# Patient Record
Sex: Female | Born: 1975 | Race: Black or African American | Hispanic: No | Marital: Married | Smoking: Former smoker
Health system: Southern US, Community
[De-identification: ages and names within clinical notes are randomized; demographics above are authoritative.]

## PROBLEM LIST (undated history)

## (undated) DIAGNOSIS — Z720 Tobacco use: Secondary | ICD-10-CM

## (undated) DIAGNOSIS — I10 Essential (primary) hypertension: Secondary | ICD-10-CM

## (undated) DIAGNOSIS — L509 Urticaria, unspecified: Secondary | ICD-10-CM

## (undated) DIAGNOSIS — R51 Headache: Secondary | ICD-10-CM

## (undated) DIAGNOSIS — M545 Low back pain, unspecified: Secondary | ICD-10-CM

## (undated) DIAGNOSIS — K219 Gastro-esophageal reflux disease without esophagitis: Secondary | ICD-10-CM

## (undated) DIAGNOSIS — H53459 Other localized visual field defect, unspecified eye: Secondary | ICD-10-CM

## (undated) DIAGNOSIS — I729 Aneurysm of unspecified site: Secondary | ICD-10-CM

## (undated) HISTORY — DX: Low back pain: M54.5

## (undated) HISTORY — PX: TUBAL LIGATION: SHX77

## (undated) HISTORY — DX: Urticaria, unspecified: L50.9

## (undated) HISTORY — DX: Headache: R51

## (undated) HISTORY — DX: Low back pain, unspecified: M54.50

## (undated) HISTORY — DX: Tobacco use: Z72.0

## (undated) HISTORY — PX: HERNIA REPAIR: SHX51

## (undated) HISTORY — DX: Essential (primary) hypertension: I10

---

## 2008-03-06 ENCOUNTER — Inpatient Hospital Stay (HOSPITAL_COMMUNITY): Admission: AD | Admit: 2008-03-06 | Discharge: 2008-03-06 | Payer: Self-pay | Admitting: Family Medicine

## 2008-04-26 ENCOUNTER — Ambulatory Visit (HOSPITAL_COMMUNITY): Admission: RE | Admit: 2008-04-26 | Discharge: 2008-04-26 | Payer: Self-pay | Admitting: Obstetrics and Gynecology

## 2008-08-18 ENCOUNTER — Encounter: Payer: Self-pay | Admitting: Internal Medicine

## 2008-08-18 LAB — CONVERTED CEMR LAB: Pap Smear: NORMAL

## 2008-08-19 ENCOUNTER — Ambulatory Visit: Payer: Self-pay | Admitting: Obstetrics and Gynecology

## 2008-08-19 ENCOUNTER — Inpatient Hospital Stay (HOSPITAL_COMMUNITY): Admission: AD | Admit: 2008-08-19 | Discharge: 2008-08-19 | Payer: Self-pay | Admitting: Obstetrics & Gynecology

## 2008-08-20 ENCOUNTER — Inpatient Hospital Stay (HOSPITAL_COMMUNITY): Admission: AD | Admit: 2008-08-20 | Discharge: 2008-08-22 | Payer: Self-pay | Admitting: Obstetrics & Gynecology

## 2008-08-20 ENCOUNTER — Ambulatory Visit: Payer: Self-pay | Admitting: Obstetrics & Gynecology

## 2008-12-30 ENCOUNTER — Emergency Department (HOSPITAL_COMMUNITY): Admission: EM | Admit: 2008-12-30 | Discharge: 2008-12-31 | Payer: Self-pay | Admitting: Emergency Medicine

## 2009-11-21 ENCOUNTER — Emergency Department (HOSPITAL_COMMUNITY)
Admission: EM | Admit: 2009-11-21 | Discharge: 2009-11-22 | Payer: Self-pay | Source: Home / Self Care | Admitting: Emergency Medicine

## 2010-01-18 HISTORY — PX: ANEURYSM COILING: SHX5349

## 2010-02-04 ENCOUNTER — Inpatient Hospital Stay (HOSPITAL_COMMUNITY)
Admission: EM | Admit: 2010-02-04 | Discharge: 2010-02-11 | Disposition: A | Discharge status: Still In Hospital | Payer: Self-pay | Source: Home / Self Care | Attending: Internal Medicine | Admitting: Internal Medicine

## 2010-02-05 ENCOUNTER — Inpatient Hospital Stay (HOSPITAL_COMMUNITY)
Admission: EM | Admit: 2010-02-05 | Discharge: 2010-02-11 | Payer: Self-pay | Source: Home / Self Care | Attending: Internal Medicine | Admitting: Internal Medicine

## 2010-02-05 ENCOUNTER — Ambulatory Visit (HOSPITAL_COMMUNITY)
Admission: RE | Admit: 2010-02-05 | Discharge: 2010-02-05 | Payer: Self-pay | Attending: Neurology | Admitting: Neurology

## 2010-02-09 LAB — URINALYSIS, ROUTINE W REFLEX MICROSCOPIC
Ketones, ur: NEGATIVE mg/dL
Leukocytes, UA: NEGATIVE
Nitrite: NEGATIVE
Nitrite: NEGATIVE
Protein, ur: NEGATIVE mg/dL
Specific Gravity, Urine: 1.006 (ref 1.005–1.030)
Urobilinogen, UA: 0.2 mg/dL (ref 0.0–1.0)
pH: 7.5 (ref 5.0–8.0)

## 2010-02-09 LAB — PHOSPHORUS: Phosphorus: 2.2 mg/dL — ABNORMAL LOW (ref 2.3–4.6)

## 2010-02-09 LAB — CBC
HCT: 39.2 % (ref 36.0–46.0)
HCT: 35.5 % — ABNORMAL LOW (ref 36.0–46.0)
Hemoglobin: 13.7 g/dL (ref 12.0–15.0)
Hemoglobin: 12.2 g/dL (ref 12.0–15.0)
MCH: 31.5 pg (ref 26.0–34.0)
MCHC: 34.4 g/dL (ref 30.0–36.0)
MCV: 91.1 fL (ref 78.0–100.0)
Platelets: 254 10*3/uL (ref 150–400)
RDW: 12.6 % (ref 11.5–15.5)
RDW: 12.9 % (ref 11.5–15.5)
RDW: 13.3 % (ref 11.5–15.5)
WBC: 6.7 10*3/uL (ref 4.0–10.5)
WBC: 13.3 10*3/uL — ABNORMAL HIGH (ref 4.0–10.5)

## 2010-02-09 LAB — CSF CELL COUNT WITH DIFFERENTIAL
Lymphs, CSF: 49 % (ref 40–80)
RBC Count, CSF: 265100 /mm3 — ABNORMAL HIGH
Segmented Neutrophils-CSF: 49 % — ABNORMAL HIGH (ref 0–6)
Segmented Neutrophils-CSF: 69 % — ABNORMAL HIGH (ref 0–6)
Tube #: 4

## 2010-02-09 LAB — CRYPTOCOCCAL ANTIGEN: Crypto Ag: NEGATIVE

## 2010-02-09 LAB — COMPREHENSIVE METABOLIC PANEL
ALT: 12 U/L (ref 0–35)
AST: 13 U/L (ref 0–37)
Albumin: 3.6 g/dL (ref 3.5–5.2)
Calcium: 8.8 mg/dL (ref 8.4–10.5)
Creatinine, Ser: 0.75 mg/dL (ref 0.4–1.2)
GFR calc Af Amer: >60 mL/min (ref >60)
GFR calc non Af Amer: >60 mL/min (ref >60)
Sodium: 139 mEq/L (ref 135–145)
Total Protein: 6.4 g/dL (ref 6.0–8.3)

## 2010-02-09 LAB — POCT I-STAT, CHEM 8
Chloride: 105 mEq/L (ref 96–112)
Glucose, Bld: 105 mg/dL — ABNORMAL HIGH (ref 70–99)
HCT: 41 % (ref 36.0–46.0)
Potassium: 3.4 mEq/L — ABNORMAL LOW (ref 3.5–5.1)
Sodium: 140 mEq/L (ref 135–145)

## 2010-02-09 LAB — DIFFERENTIAL
Basophils Absolute: 0 10*3/uL (ref 0.0–0.1)
Basophils Relative: 0 % (ref 0–1)
Eosinophils Relative: 2 % (ref 0–5)
Lymphocytes Relative: 42 % (ref 12–46)
Neutro Abs: 3.3 10*3/uL (ref 1.7–7.7)

## 2010-02-09 LAB — URINE MICROSCOPIC-ADD ON

## 2010-02-09 LAB — URINE CULTURE
Colony Count: NO GROWTH
Culture  Setup Time: 201201190413
Culture: NO GROWTH

## 2010-02-09 LAB — RAPID URINE DRUG SCREEN, HOSP PERFORMED
Amphetamines: NOT DETECTED
Barbiturates: NOT DETECTED
Benzodiazepines: NOT DETECTED
Cocaine: NOT DETECTED
Cocaine: NOT DETECTED
Opiates: NOT DETECTED
Tetrahydrocannabinol: NOT DETECTED
Tetrahydrocannabinol: NOT DETECTED

## 2010-02-09 LAB — LIPID PANEL
Cholesterol: 129 mg/dL (ref 0–200)
LDL Cholesterol: 78 mg/dL (ref 0–99)

## 2010-02-09 LAB — POCT PREGNANCY, URINE: Preg Test, Ur: NEGATIVE

## 2010-02-09 LAB — BASIC METABOLIC PANEL
BUN: 4 mg/dL — ABNORMAL LOW (ref 6–23)
GFR calc Af Amer: >60 mL/min (ref >60)
GFR calc non Af Amer: >60 mL/min (ref >60)
Potassium: 3.4 mEq/L — ABNORMAL LOW (ref 3.5–5.1)
Sodium: 142 mEq/L (ref 135–145)

## 2010-02-09 LAB — MRSA PCR SCREENING: MRSA by PCR: NEGATIVE

## 2010-02-09 LAB — PROTIME-INR: INR: 1.14 (ref 0.00–1.49)

## 2010-02-09 LAB — CRYPTOCOCCAL ANTIGEN, CSF: Crypto Ag: NEGATIVE

## 2010-02-09 LAB — RPR: RPR Ser Ql: NONREACTIVE

## 2010-02-10 LAB — DIFFERENTIAL
Basophils Absolute: 0 10*3/uL (ref 0.0–0.1)
Eosinophils Absolute: 0.1 10*3/uL (ref 0.0–0.7)
Eosinophils Absolute: 0.2 10*3/uL (ref 0.0–0.7)
Eosinophils Relative: 1 % (ref 0–5)
Eosinophils Relative: 3 % (ref 0–5)
Lymphocytes Relative: 44 % (ref 12–46)
Lymphs Abs: 3.1 10*3/uL (ref 0.7–4.0)
Monocytes Relative: 7 % (ref 3–12)
Neutrophils Relative %: 51 % (ref 43–77)

## 2010-02-10 LAB — CBC
HCT: 36.2 % (ref 36.0–46.0)
MCH: 31.4 pg (ref 26.0–34.0)
MCV: 89.4 fL (ref 78.0–100.0)
Platelets: 269 10*3/uL (ref 150–400)
Platelets: 274 10*3/uL (ref 150–400)
RBC: 4.05 MIL/uL (ref 3.87–5.11)
RDW: 13 % (ref 11.5–15.5)
WBC: 9.6 10*3/uL (ref 4.0–10.5)

## 2010-02-10 LAB — URINE CULTURE
Colony Count: NO GROWTH
Culture  Setup Time: 201201201952

## 2010-02-10 LAB — BASIC METABOLIC PANEL
BUN: 12 mg/dL (ref 6–23)
CO2: 26 mEq/L (ref 19–32)
Calcium: 8.9 mg/dL (ref 8.4–10.5)
Chloride: 102 mEq/L (ref 96–112)
Creatinine, Ser: 0.64 mg/dL (ref 0.4–1.2)
Creatinine, Ser: 0.68 mg/dL (ref 0.4–1.2)
GFR calc non Af Amer: >60 mL/min (ref >60)
Glucose, Bld: 101 mg/dL — ABNORMAL HIGH (ref 70–99)
Glucose, Bld: 136 mg/dL — ABNORMAL HIGH (ref 70–99)
Potassium: 3.9 mEq/L (ref 3.5–5.1)
Sodium: 139 mEq/L (ref 135–145)

## 2010-02-10 LAB — CSF CULTURE W GRAM STAIN: Culture: NO GROWTH

## 2010-02-11 ENCOUNTER — Other Ambulatory Visit (HOSPITAL_COMMUNITY): Payer: Self-pay | Admitting: Interventional Radiology

## 2010-02-11 DIAGNOSIS — I729 Aneurysm of unspecified site: Secondary | ICD-10-CM

## 2010-02-11 LAB — CBC
HCT: 33.3 % — ABNORMAL LOW (ref 36.0–46.0)
Hemoglobin: 11.6 g/dL — ABNORMAL LOW (ref 12.0–15.0)
MCHC: 34.8 g/dL (ref 30.0–36.0)
MCV: 89 fL (ref 78.0–100.0)
RDW: 12.6 % (ref 11.5–15.5)

## 2010-02-11 LAB — DIFFERENTIAL
Eosinophils Relative: 0 % (ref 0–5)
Lymphocytes Relative: 6 % — ABNORMAL LOW (ref 12–46)
Lymphs Abs: 0.5 10*3/uL — ABNORMAL LOW (ref 0.7–4.0)
Monocytes Absolute: 0 10*3/uL — ABNORMAL LOW (ref 0.1–1.0)
Monocytes Relative: 0 % — ABNORMAL LOW (ref 3–12)
Neutro Abs: 7.2 10*3/uL (ref 1.7–7.7)

## 2010-02-11 LAB — BASIC METABOLIC PANEL
BUN: 10 mg/dL (ref 6–23)
BUN: 5 mg/dL — ABNORMAL LOW (ref 6–23)
CO2: 25 mEq/L (ref 19–32)
CO2: 24 mEq/L (ref 19–32)
Calcium: 8.7 mg/dL (ref 8.4–10.5)
Chloride: 106 mEq/L (ref 96–112)
GFR calc non Af Amer: >60 mL/min (ref >60)
GFR calc non Af Amer: >60 mL/min (ref >60)
Glucose, Bld: 105 mg/dL — ABNORMAL HIGH (ref 70–99)
Glucose, Bld: 161 mg/dL — ABNORMAL HIGH (ref 70–99)
Potassium: 4.1 mEq/L (ref 3.5–5.1)

## 2010-02-11 LAB — APTT
aPTT: 28 seconds (ref 24–37)
aPTT: 30 seconds (ref 24–37)

## 2010-02-12 LAB — POCT ACTIVATED CLOTTING TIME
Activated Clotting Time: 181 seconds
Activated Clotting Time: 170 seconds

## 2010-02-13 NOTE — Discharge Summary (Addendum)
Kathy Chaney, Kathy Chaney              ACCOUNT NO.:  [id number]  MEDICAL RECORD NO.:  [id number]          PATIENT TYPE:  INP  LOCATION:  3111                         FACILITY:  [facility]  PHYSICIAN:  Lonia Blood, M.D.       DATE OF BIRTH:  [birth year]  DATE OF ADMISSION:  02/05/2010 DATE OF DISCHARGE:  02/11/2010                              DISCHARGE SUMMARY   Primary care physician is going to become Dr. Sanda Linger with [facility].  DISCHARGE DIAGNOSES: 1. 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 Corliss Skains. 2. Hypertension. 3. Tobacco abuse.  DISCHARGE MEDICATIONS: 1. Norvasc 5 mg daily. 2. Ibuprofen 200 mg by mouth 3 times a day as needed for pain. 3. Tylenol 650 mg by mouth every 4 hours needed for pain. 4. Nicotine patches 21 mg for 2 weeks, 14 mg for 2 weeks, 7 mg for 2     weeks then stop.  CONDITION ON DISCHARGE:  The patient discharged alert, oriented, in no acute this distress.  She will follow up with Dr. Corliss Skains from interventional radiology on February 24, 2010, at 2 o'clock in the afternoon.  She was told not to drive, no heavy lifting, no bending. The patient was also scheduled with a new primary care physician Dr. Sanda Linger on February 19, 2010, at 10:30 o'clock in the morning.  PROCEDURE DURING THIS ADMISSION: 1. On February 04, 2010, the patient underwent a head CT without     contrast.  No evidence for acute intracranial hemorrhage,     hydrocephalus, or mass effect. 2. On February 04, 2010, MRI of the brain, which showed normal     appearance of the brain.  No evidence for meningitis. 3. MRA of the head.  Findings of possible basilar artery aneurysm. 4. CT angiogram of the head.  Findings of 6.7-mm aneurysm projecting     left forehead from the basilar tip with a transverse diameter of     2.5 mm. 5.  Lumbar puncture.  Performed by Dr. Susy Frizzle findings of     total protein of 539, 960 white blood cells, and 31,650 red blood     cells.  Negative cryptococcal antigen, and no growth in the     culture. 6. 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 7. On February 10, 2010, coiling of the basilar artery aneurysm.  CONSULTATION THIS ADMISSION:  The patient was seen in consultation by Dr. Porfirio Mylar Dohmeier from Neurology and Dr. Corliss Skains from Interventional Radiology.  HISTORY AND PHYSICAL:  Refer to dictated H and P, which was done by Dr. Venetia Constable on February 04, 2010.  HOSPITAL COURSE:  [name] is a 35 year old woman without any significant past medical history was admitted from the emergency room with complaints of excruciating headache.  She was found to have 33,000 red blood cells on the spinal tap and a  basilar artery aneurysm.  There was no evidence for subarachnoid hemorrhage.  It was thought that probably the red blood cells in the lumbar puncture were probably due to traumatic tap rather than through undiagnosed subarachnoid hemorrhage. The patient went on to have a cerebral angiogram, which did not demonstrate any aneurysmal leak.  Her mental status remained fine and she was not hemodynamically unstable at any point in time.  Upon the initial admission, she was started on empiric antibiotics in case this was a meningitis, but her clinical course was not compatible with meningitis.  It was felt that the patient had a headache, which could not be related to the basal artery aneurysm versus it was a severe migraine headache.  The patient was given intravenous Decadron and intravenous Toradol, which controls her pain nicely.  She was then transferred to [facility] where on February 05, 2010, underwent a cerebral angiogram confirming the presence of the aneurysm.  She  then patiently waited for 5 days in the hospital.  Interventional radiology procedure on February 10, 2010 to coil the aneurysm.  Postprocedure, she has done well, remaining neurologically intact, alert, oriented, in no acute distress with stable vital signs.  Today, the patient upon exam at the time of discharge shows a temperature 98.3, heart rate of 68, blood pressure 110/74, saturating 99% on room air.  She is alert and oriented in no acute distress.  Strength 5/5 in all 4 extremities. Sensation intact.  Pupils symmetric and reactive to light.  All cranial nerves are within normal limits.  She is set up with follow up with Interventional Radiology as well as with her new primary care physician, Dr. Sanda Linger.     Lonia Blood, M.D.     SL/MEDQ  D:  02/11/2010  T:  02/12/2010  Job:  [id number]  cc:   Sanda Linger, MD [place] Silos. Corliss Skains, M.D.  Electronically Signed by Lonia Blood M.D. on 02/13/2010 02:13:51 PM

## 2010-02-19 ENCOUNTER — Ambulatory Visit (INDEPENDENT_AMBULATORY_CARE_PROVIDER_SITE_OTHER): Payer: BC Managed Care – PPO | Admitting: Internal Medicine

## 2010-02-19 ENCOUNTER — Encounter: Payer: Self-pay | Admitting: Internal Medicine

## 2010-02-19 DIAGNOSIS — M545 Low back pain: Secondary | ICD-10-CM | POA: Insufficient documentation

## 2010-02-19 DIAGNOSIS — R519 Headache, unspecified: Secondary | ICD-10-CM | POA: Insufficient documentation

## 2010-02-19 DIAGNOSIS — I671 Cerebral aneurysm, nonruptured: Secondary | ICD-10-CM | POA: Insufficient documentation

## 2010-02-19 DIAGNOSIS — H538 Other visual disturbances: Secondary | ICD-10-CM

## 2010-02-19 DIAGNOSIS — R51 Headache: Secondary | ICD-10-CM

## 2010-02-19 DIAGNOSIS — F17200 Nicotine dependence, unspecified, uncomplicated: Secondary | ICD-10-CM

## 2010-02-19 DIAGNOSIS — K429 Umbilical hernia without obstruction or gangrene: Secondary | ICD-10-CM | POA: Insufficient documentation

## 2010-02-19 DIAGNOSIS — I10 Essential (primary) hypertension: Secondary | ICD-10-CM

## 2010-02-22 NOTE — H&P (Signed)
Kathy Chaney, Kathy Chaney              ACCOUNT NO.:  [id number]  MEDICAL RECORD NO.:  [id number]          PATIENT TYPE:  EMS  LOCATION:  ED                           FACILITY:  [facility]  PHYSICIAN:  Conley Canal, MD      DATE OF BIRTH:  1975/09/28  DATE OF ADMISSION:  02/04/2010 DATE OF DISCHARGE:                             HISTORY & PHYSICAL   PRIMARY CARE PHYSICIAN:  The patient has no primary care physician.  CHIEF COMPLAINT:  Severe left-sided headache x3 days.  HISTORY OF PRESENT ILLNESS:  [name] is a pleasant 35 year old female with history of hypertension not on medications as she was not following with any PCP recently, who comes in today with complaints of severe left-sided headache.  She described it as the worst headache of her life.  It started gradually 2 days ago, throbbing in nature, mostly left sided, associated with photophobia and some neck pain.  It culminated in nausea and vomiting yesterday.  She described the vomiting as nonprojectile; however, she states that the headache was worsened by lying down.  She denies any fever.  No cough.  No diarrhea.  Nobody else sick at home.  She took some ibuprofen at home without any relief.  This is the first such severe headache.  The headache was relieved when she received pain medications in the emergency room, and she also felt much relief after the lumbar puncture performed in the emergency room.  She had a CT of the brain without contrast, which suggested possible inflammation in the suprasellar cistern.  She had a lumbar puncture which demonstrated WBC of 480, RBC 330,000 with 49% segmented neutrophils and 2% eosinophils.  The CSF was described as red and cloudy with a protein level 539, glucose level 54.  The patient was hence given vancomycin and ceftriaxone by the emergency room physician, and she was referred to the Hospitalist service for further management.  She is awaiting an MRI.  At the time of evaluation,  her headache has improved. She is more comfortable.  She denies any other symptomatology.  PAST MEDICAL HISTORY: 1. Hypertension. 2. Tobacco habituation.  ALLERGIES:  No known drug allergies.  HOME MEDICATIONS:  Ibuprofen.  FAMILY HISTORY:  The patient does not know her family history as she was adopted.  REVIEW OF SYSTEMS:  Unremarkable except as highlighted in the history of present illness.  SOCIAL HISTORY:  The patient is married.  She has 3 kids.  Smokes up to 8 cigarettes per day, occasionally drinks alcohol and denies illicit drugs.  PHYSICAL EXAMINATION:  GENERAL:  This is a well-looking young lady who is not in acute distress. VITAL SIGNS:  Blood pressure 114/72, heart rate is 75.  She is afebrile, oxygenating adequately. HEAD, EYES, EARS, NOSE AND THROAT:  Pupils equal, reacting to light. NECK:  No neck stiffness. RESPIRATORY SYSTEM:  Good air entry bilaterally with no rhonchi, rales or wheezes. CARDIOVASCULAR SYSTEM:  First and second heart sounds heard.  No murmurs.  Pulse regular. ABDOMEN:  Scaphoid, soft, nontender.  No palpable organomegaly.  Bowel sounds are normal. CNS:  The patient is alert, oriented in person,  place and time.  Kernig sign negative.  No localizing neurological signs. EXTREMITIES:  No pedal edema.  Peripheral pulses equal.  LABORATORY DATA:  Labs were reviewed, significant for CBC normal. Electrolytes significant for potassium of 3.4, glucose 105, BUN 8, creatinine 0.8.  Pregnancy test negative.  Urinalysis significant for moderate blood with rare bacteria.  CSF results were discussed above.  CT brain without contrast results also discussed above.  IMPRESSION:  A 35 year old female presenting with severe headache with cerebrospinal fluid showing high number of red blood cells.  The concern at this point would be meningitis versus subarachnoid hemorrhage.  If meningitis, would worry about herpes simplex virus.  PLAN: 1. Severe  headache, meningitis, rule out other etiologies.  The     patient will be admitted to Regular Medicine under droplet     precautions.  She will be covered as if meningitis with     ceftriaxone, vancomycin and acyclovir.  We will follow CSF     including Gram stain culture and sensitivity, HSV PCR, Cryptococcal     antigen.  Also, obtain MRI to evaluate possibility of encephalitis     in her anatomy better.  Meanwhile, we will consult Neurology.  We     will obtain HIV tests, RPR and vitamin B12 level. 2. Hypertension.  We will place the patient on Norvasc. 3. Tobacco habituation.  Smoking cessation counseling given.  We will     place the patient on nicotine patch. 4. DVT prophylaxis.  SCDs. 5. The patient's condition is guarded.     Conley Canal, MD     SR/MEDQ  D:  02/04/2010  T:  02/04/2010  Job:  [id number]  Electronically Signed by Conley Canal  on 02/22/2010 57:84:69 PM

## 2010-02-24 ENCOUNTER — Ambulatory Visit (HOSPITAL_COMMUNITY)
Admission: RE | Admit: 2010-02-24 | Discharge: 2010-02-24 | Disposition: A | Discharge status: Home / Self Care | Payer: BC Managed Care – PPO | Source: Ambulatory Visit | Attending: Interventional Radiology | Admitting: Interventional Radiology

## 2010-02-24 DIAGNOSIS — I729 Aneurysm of unspecified site: Secondary | ICD-10-CM

## 2010-02-25 NOTE — Assessment & Plan Note (Signed)
Summary: new / bcbs / # cd -- [phone]   Vital Signs:  Patient profile:   35 year old female Menstrual status:  regular LMP:     01/31/2010 Height:      62 inches Weight:      127.75 pounds BMI:     23.45 O2 Sat:      99 % on Room air Temp:     98.2 degrees F oral Pulse rate:   67 / minute Pulse rhythm:   regular Resp:     16 per minute BP sitting:   102 / 64  (left arm) Cuff size:   regular  Vitals Entered By: Rock Nephew CMA (February 19, 2010 10:54 AM)  O2 Flow:  Room air CC: New hospital follow up, Hypertension Management, Preventive Care  Does patient need assistance? Functional Status Self care Ambulation Normal LMP (date): 01/31/2010     Menstrual Status regular Enter LMP: 01/31/2010 Last PAP Result normal   Primary Care Provider:  Yetta Barre  CC:  New hospital follow up, Hypertension Management, and Preventive Care.  History of Present Illness: New to me she is s/p admission for headache that was found to be caused by a left basal apex artery aneurysm that was treated with endovascular coil embolization of the basilar artery. She is doing well and says thet her headache has resolved but she still has blurred vision in her left eye and needs a visit with an eye doctor.  Also, she wants to see a surgeon to see if her umbilical hernia can be repaired.  Hypertension History:      She denies headache, chest pain, palpitations, dyspnea with exertion, orthopnea, PND, peripheral edema, visual symptoms, neurologic problems, syncope, and side effects from treatment.  She notes no problems with any antihypertensive medication side effects.        Positive major cardiovascular risk factors include hypertension.  Negative major cardiovascular risk factors include female age less than 58 years old, no history of diabetes, negative family history for ischemic heart disease, and non-tobacco-user status.        Positive history for target organ damage include prior stroke (or  TIA).  Further assessment for target organ damage reveals no history of ASHD, cardiac end-organ damage (CHF/LVH), peripheral vascular disease, renal insufficiency, or hypertensive retinopathy.     Preventive Screening-Counseling & Management  Alcohol-Tobacco     Alcohol drinks/day: 0     Alcohol Counseling: not indicated; patient does not drink     Smoking Status: never     Smoking Cessation Counseling: yes     Smoke Cessation Stage: quit     Packs/Day: 0.5     Year Started: 1990     Pack years: 15     Tobacco Counseling: to remain off tobacco products  Caffeine-Diet-Exercise     Does Patient Exercise: yes  Hep-HIV-STD-Contraception     Hepatitis Risk: no risk noted     HIV Risk: no risk noted     STD Risk: no risk noted  Safety-Violence-Falls     Seat Belt Use: yes     Helmet Use: yes     Firearms in the Home: no firearms in the home     Smoke Detectors: yes     Violence in the Home: no risk noted      Sexual History:  currently monogamous.        Drug Use:  no.        Blood Transfusions:  no.  Current Medications (verified): 1)  Norvasc 5 Mg Tabs (Amlodipine Besylate) .... Take 1 Tablet By Mouth Once A Day 2)  Tylenol 8 Hour 650 Mg Cr-Tabs (Acetaminophen) 3)  Nicotine Patches  Allergies (verified): No Known Drug Allergies  Past History:  Past Medical History: Hypertension Headache Low back pain  Past Surgical History: Tubal ligation  Family History: adopted  Social History: Married Alcohol use-no Drug use-no Regular exercise-yes Occupation: homemaker Smoking Status:  never Packs/Day:  0.5 Hepatitis Risk:  no risk noted HIV Risk:  no risk noted STD Risk:  no risk noted Seat Belt Use:  yes Drug Use:  no Does Patient Exercise:  yes Sexual History:  currently monogamous Blood Transfusions:  no  Review of Systems  The patient denies anorexia, fever, weight loss, weight gain, chest pain, syncope, dyspnea on exertion, peripheral edema,  prolonged cough, headaches, hemoptysis, abdominal pain, suspicious skin lesions, transient blindness, difficulty walking, depression, and enlarged lymph nodes.   Eyes:  Complains of blurring and vision loss-1 eye; denies discharge, double vision, eye irritation, eye pain, halos, itching, light sensitivity, red eye, and vision loss-both eyes. MS:  Complains of low back pain and stiffness; denies joint pain, joint redness, joint swelling, loss of strength, muscle aches, muscle, muscle weakness, and thoracic pain. Neuro:  Complains of visual disturbances; denies brief paralysis, difficulty with concentration, disturbances in coordination, falling down, headaches, inability to speak, memory loss, numbness, poor balance, seizures, sensation of room spinning, tingling, tremors, and weakness.  Physical Exam  General:  alert, well-developed, well-nourished, well-hydrated, appropriate dress, normal appearance, healthy-appearing, cooperative to examination, and good hygiene.   Head:  normocephalic, atraumatic, no abnormalities observed, and no abnormalities palpated.   Eyes:  anisocoria - pleft pupil is slightly dilated Ears:  R ear normal and L ear normal.   Nose:  External nasal examination shows no deformity or inflammation. Nasal mucosa are pink and moist without lesions or exudates. Mouth:  Oral mucosa and oropharynx without lesions or exudates.  Teeth in good repair. Neck:  supple, full ROM, no masses, no thyromegaly, no thyroid nodules or tenderness, no JVD, no HJR, normal carotid upstroke, no carotid bruits, no cervical lymphadenopathy, and no neck tenderness.   Lungs:  normal respiratory effort, no intercostal retractions, no accessory muscle use, normal breath sounds, no dullness, no fremitus, no crackles, and no wheezes.   Heart:  normal rate, regular rhythm, no murmur, no gallop, no rub, and no JVD.   Abdomen:  soft, non-tender, normal bowel sounds, no distention, no masses, no guarding, no  rigidity, no rebound tenderness, no inguinal hernia, no hepatomegaly, no splenomegaly, and umbilical hernia.   Msk:  normal ROM, no joint tenderness, no joint swelling, no joint warmth, no redness over joints, no joint deformities, no joint instability, no crepitation, and no muscle atrophy.   Pulses:  R and L carotid,radial,femoral,dorsalis pedis and posterior tibial pulses are full and equal bilaterally Extremities:  No clubbing, cyanosis, edema, or deformity noted with normal full range of motion of all joints.   Neurologic:  No cranial nerve deficits noted. Station and gait are normal. Plantar reflexes are down-going bilaterally. DTRs are symmetrical throughout. Sensory, motor and coordinative functions appear intact. Skin:  turgor normal, color normal, no rashes, no suspicious lesions, no ecchymoses, no petechiae, no purpura, no ulcerations, no edema, and tattoo(s).   Cervical Nodes:  no anterior cervical adenopathy and no posterior cervical adenopathy.   Axillary Nodes:  no R axillary adenopathy and no L axillary adenopathy.  Inguinal Nodes:  no R inguinal adenopathy and no L inguinal adenopathy.   Psych:  Cognition and judgment appear intact. Alert and cooperative with normal attention span and concentration. No apparent delusions, illusions, hallucinations   Impression & Recommendations:  Problem # 1:  TOBACCO USE (ICD-305.1) Assessment Improved  Encouraged smoking cessation and discussed different methods for smoking cessation.   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 Take 1 tablet by mouth once a day  BP today: 102/64  10 Yr Risk Heart Disease: Not enough information  Problem # 6:  BLURRED VISION (ICD-368.8) Assessment: New  Orders: Ophthalmology Referral (Ophthalmology)  Complete Medication List: 1)  Norvasc 5 Mg Tabs (Amlodipine besylate) .... Take 1 tablet by mouth once a day 2)  Tylenol 8 Hour 650 Mg Cr-tabs (Acetaminophen) 3)  Nicotine Patches   Other Orders: Physical Therapy Referral (PT)  Hypertension Assessment/Plan:      The patient's hypertensive risk group is category C: Target organ damage and/or diabetes.  Today's blood pressure is 102/64.  Her blood pressure goal is < 140/90.  PAP Screening:    Hx Cervical Dysplasia in last 5 yrs? No    3 normal PAP smears in last 5 yrs? Yes    Last PAP smear:  08/18/2008    Reviewed PAP smear recommendations:  patient defers to GYN provider  Osteoporosis Risk Assessment:  Risk Factors for Fracture or Low Bone Density:   Smoking status:       never  Patient Instructions: 1)  Please schedule a follow-up appointment in 3 months. 2)  Check your Blood Pressure regularly. If it is above 130/80: you should make an appointment. Prescriptions: NORVASC 5 MG TABS (AMLODIPINE BESYLATE) Take 1 tablet by mouth once a day  #30 x 11   Entered and Authorized by:   Etta Grandchild MD   Signed by:   Etta Grandchild MD on 02/19/2010   Method used:   Print then Give to Patient   RxID:   [id number]    Orders Added: 1)  Ophthalmology Referral [Ophthalmology] 2)  Physical Therapy Referral [PT] 3)  Surgical Referral [Surgery] 4)  New Patient Level IV [14782]    Preventive Care Screening  Pap Smear:    Date:  08/18/2008    Results:  normal

## 2010-02-26 ENCOUNTER — Ambulatory Visit: Payer: BC Managed Care – PPO | Admitting: Rehabilitative and Restorative Service Providers"

## 2010-03-04 NOTE — Consult Note (Signed)
NAMECORI, Kathy Chaney NO.:  [id number]  MEDICAL RECORD NO.:  [id number]          PATIENT TYPE:  INP  LOCATION:  0107                         FACILITY:  [facility]  PHYSICIAN:  Melvyn Novas, M.D.  DATE OF BIRTH:  November 08, 1975  DATE OF CONSULTATION:  02/04/2010 DATE OF DISCHARGE:                                CONSULTATION   REQUESTING PHYSICIAN:  Samuel Jester, DO of the [facility] ED and by the [facility] who is admitting the patient to the [facility].  HISTORY OF PRESENT ILLNESS:  Kathy Chaney was born on 1975-09-29. She is a 35 year old African American right-handed female, slender, well groomed, alert and oriented.  She presents with an acute onset of headaches between 10:30 and 1:30 last night that became so severe that she was extremely nauseated.  She felt that the left side of her head "was exploding" and felt severely impaired.  She was crying according to her girlfriend who then brought her here to the hospital.  This friend works in nursing care, took the patient's blood pressure because the first concern of Kathy Chaney was that her blood pressure may be the cause of the headaches and also her blood pressure was slightly elevated at 145/100.  It was certainly not explaining the severe degree of headaches.  She also became nauseated and vomited and when she came to the [facility] ED, she was placed on meningitis precautions due to this development.  The patient had already some headaches throughout Monday but no neck stiffness.  On Tuesday, she said she felt increasingly impaired at night and then on Wednesday morning at about 1:30 a.m. came here to the [facility] ER.  She also started to hyperventilate.  At that time, she may have had neck stiffness, but this has improved.  There is no neck stiffness, photophobia or even headache at this time present. The patient underwent a traumatic lumbar puncture here in the ED, and  we received the red blood cell count.  They show 331,000 in tube #1 and 265,000 in tube #4.  There is not a significant drop in red blood cells, and the concern was now that she may have an aneurysm or subarachnoid hemorrhage.  PAST MEDICAL HISTORY:  Only positive for hypertension.  MEDICATIONS:  She is on no current medications.  ALLERGIES:  She has not listed any medication allergies.  FAMILY HISTORY:  Positive for hypertension.  SOCIAL HISTORY:  She has 2 children, a teenager and a 19-year-old and according to my colleague's note she is a single mother.  REVIEW OF SYSTEMS:  Endorsed acute headache, left temple, a feeling of pressure behind the left eye and in the temple, neck stiffness transiently, nausea and vomiting transiently.  No numbness, no blackout, no tremor, but she was definitely very anxious, she states.  PHYSICAL EXAMINATION:  Her blood pressure here has been systolically ranging from 140-160 and a diastolic now is 72, but has been as high as 86.  Her pulse rate is between 75-110.  Her respiratory rate now is 16. Her temperature is 98 degrees Fahrenheit.  She is alert and  oriented. She carries out 2-step commands.  She is conversant, pleasant.  She does not seem to be impaired at this time, rather scared about what happened to her last night, but she does not have residual effects.  Her eyes show normal extraocular movements, but she did have a mild nystagmus with gaze to the left.  Interesting is also that her left pupil is more dilated than her right and does not directly constrict to light.  She is not aware if this is new or old.  She has never been told about this finding before.  Her face is symmetric.  She does not have an exophthalmos, enophthalmos or ptosis, and she has no facial sensory loss.  There is no dysarthria or aphasia.  She can perform a shoulder shrug.  She has now a full range of motion for the neck.  She can chin tuck, and she shows good  coordination on finger-nose-finger testing and heel-to-shin testing.  Gait exam was deferred, but the patient moved all extremities antigravity purposefully without drift and again body sensory is also intact to primary modalities.  LABORATORY DATA:  Labs show a white blood cell count of 6.7 and an hemoglobin and hematocrit of 13.7/39.2 and 293 platelets.  Sodium is 140, potassium is 3.4, glucose is 105, creatinine is 0.8, BUN is 8.  IMAGING TESTS:  The first CT obtained did not show PE abnormality neither a bleed.  The patient had been in the MRI when we came to the ED.  Also, we had requested a CT angiogram.  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  I spoke with Dr. Jordan Likes, the neurosurgeon on call on the phone, who also planned to review the CT angiogram and see if this aneurysm is truly present.  PLAN:  I intend to treat the patient with a nonvasotonic medication should she develop acute headaches again, especially if she develops hemicrania again, and I recommend 1 g of Depacon IV rapid infusion less than 12 minutes in a piggyback for treatment of acute headaches.  I also want to have neuro checks on the floor with pupillary response q.4 hours, bilateral hand strength, grip strength q.4 hours and of course vital signs.  Given the questionable results of the LP and the MRA, I think it is justified to place this patient in a telemetry floor with neuro checks.     Melvyn Novas, M.D.     CD/MEDQ  D:  02/04/2010  T:  02/04/2010  Job:  [id number]  Electronically Signed by Melvyn Novas M.D. on 03/04/2010 09:35:50 AM

## 2010-03-09 ENCOUNTER — Ambulatory Visit: Payer: BC Managed Care – PPO | Admitting: Rehabilitative and Restorative Service Providers"

## 2010-03-31 LAB — URINALYSIS, ROUTINE W REFLEX MICROSCOPIC
Bilirubin Urine: NEGATIVE
Ketones, ur: NEGATIVE mg/dL
Nitrite: NEGATIVE
Protein, ur: NEGATIVE mg/dL
Urobilinogen, UA: 0.2 mg/dL (ref 0.0–1.0)
pH: 7 (ref 5.0–8.0)

## 2010-03-31 LAB — COMPREHENSIVE METABOLIC PANEL
ALT: 9 U/L (ref 0–35)
AST: 19 U/L (ref 0–37)
Calcium: 9.3 mg/dL (ref 8.4–10.5)
Creatinine, Ser: 0.76 mg/dL (ref 0.4–1.2)
GFR calc Af Amer: >60 mL/min (ref >60)
Glucose, Bld: 71 mg/dL (ref 70–99)
Sodium: 139 mEq/L (ref 135–145)
Total Protein: 6.8 g/dL (ref 6.0–8.3)

## 2010-03-31 LAB — DIFFERENTIAL
Eosinophils Absolute: 0.2 10*3/uL (ref 0.0–0.7)
Lymphocytes Relative: 31 % (ref 12–46)
Lymphs Abs: 2.8 10*3/uL (ref 0.7–4.0)
Monocytes Relative: 6 % (ref 3–12)
Neutrophils Relative %: 61 % (ref 43–77)

## 2010-03-31 LAB — CBC
MCHC: 34 g/dL (ref 30.0–36.0)
RDW: 13.3 % (ref 11.5–15.5)

## 2010-03-31 LAB — GC/CHLAMYDIA PROBE AMP, GENITAL
Chlamydia, DNA Probe: NEGATIVE
GC Probe Amp, Genital: NEGATIVE

## 2010-03-31 LAB — WET PREP, GENITAL: Trich, Wet Prep: NONE SEEN

## 2010-04-15 ENCOUNTER — Other Ambulatory Visit (HOSPITAL_COMMUNITY): Payer: Self-pay | Admitting: Interventional Radiology

## 2010-04-15 DIAGNOSIS — I729 Aneurysm of unspecified site: Secondary | ICD-10-CM

## 2010-04-21 LAB — URINALYSIS, ROUTINE W REFLEX MICROSCOPIC
Glucose, UA: NEGATIVE mg/dL
Ketones, ur: NEGATIVE mg/dL
Nitrite: NEGATIVE
Specific Gravity, Urine: 1.012 (ref 1.005–1.030)
pH: 7 (ref 5.0–8.0)

## 2010-04-21 LAB — BASIC METABOLIC PANEL
BUN: 7 mg/dL (ref 6–23)
Chloride: 104 mEq/L (ref 96–112)
GFR calc Af Amer: >60 mL/min (ref >60)
GFR calc non Af Amer: >60 mL/min (ref >60)
Potassium: 3.6 mEq/L (ref 3.5–5.1)
Sodium: 139 mEq/L (ref 135–145)

## 2010-04-24 ENCOUNTER — Other Ambulatory Visit (HOSPITAL_COMMUNITY): Payer: Self-pay | Admitting: Interventional Radiology

## 2010-04-24 ENCOUNTER — Ambulatory Visit (HOSPITAL_COMMUNITY)
Admission: RE | Admit: 2010-04-24 | Discharge: 2010-04-24 | Disposition: A | Discharge status: Home / Self Care | Payer: BC Managed Care – PPO | Source: Ambulatory Visit | Attending: Interventional Radiology | Admitting: Interventional Radiology

## 2010-04-24 DIAGNOSIS — Z09 Encounter for follow-up examination after completed treatment for conditions other than malignant neoplasm: Secondary | ICD-10-CM | POA: Insufficient documentation

## 2010-04-24 DIAGNOSIS — I10 Essential (primary) hypertension: Secondary | ICD-10-CM | POA: Insufficient documentation

## 2010-04-24 DIAGNOSIS — F17200 Nicotine dependence, unspecified, uncomplicated: Secondary | ICD-10-CM | POA: Insufficient documentation

## 2010-04-24 DIAGNOSIS — Z9889 Other specified postprocedural states: Secondary | ICD-10-CM | POA: Insufficient documentation

## 2010-04-24 DIAGNOSIS — I729 Aneurysm of unspecified site: Secondary | ICD-10-CM

## 2010-04-24 LAB — CBC
HCT: 37 % (ref 36.0–46.0)
Hemoglobin: 13.1 g/dL (ref 12.0–15.0)
MCV: 88.1 fL (ref 78.0–100.0)
RBC: 4.2 MIL/uL (ref 3.87–5.11)
WBC: 7.5 10*3/uL (ref 4.0–10.5)

## 2010-04-24 LAB — PROTIME-INR: INR: 0.89 (ref 0.00–1.49)

## 2010-04-24 LAB — POCT I-STAT, CHEM 8
BUN: 8 mg/dL (ref 6–23)
Calcium, Ion: 1.14 mmol/L (ref 1.12–1.32)
Chloride: 104 mEq/L (ref 96–112)
HCT: 40 % (ref 36.0–46.0)
Potassium: 3.5 mEq/L (ref 3.5–5.1)
Sodium: 140 mEq/L (ref 135–145)

## 2010-04-24 LAB — APTT: aPTT: 27 seconds (ref 24–37)

## 2010-04-24 MED ORDER — IOHEXOL 300 MG/ML  SOLN
150.0000 mL | Freq: Once | INTRAMUSCULAR | Status: AC | PRN
  Administered 2010-04-24: 65 mL via INTRA_ARTERIAL

## 2010-04-25 LAB — CBC
HCT: 39.1 % (ref 36.0–46.0)
MCV: 98.5 fL (ref 78.0–100.0)
Platelets: 191 10*3/uL (ref 150–400)
RBC: 3.97 MIL/uL (ref 3.87–5.11)
WBC: 9.9 10*3/uL (ref 4.0–10.5)

## 2010-04-25 LAB — RPR: RPR Ser Ql: NONREACTIVE

## 2010-05-05 LAB — CBC
Hemoglobin: 10.9 g/dL — ABNORMAL LOW (ref 12.0–15.0)
RBC: 3.26 MIL/uL — ABNORMAL LOW (ref 3.87–5.11)
WBC: 10.2 10*3/uL (ref 4.0–10.5)

## 2010-05-05 LAB — WET PREP, GENITAL
Clue Cells Wet Prep HPF POC: NONE SEEN
Trich, Wet Prep: NONE SEEN

## 2010-05-05 LAB — URINALYSIS, ROUTINE W REFLEX MICROSCOPIC
Bilirubin Urine: NEGATIVE
Hgb urine dipstick: NEGATIVE
Nitrite: NEGATIVE
Specific Gravity, Urine: 1.01 (ref 1.005–1.030)
Urobilinogen, UA: 0.2 mg/dL (ref 0.0–1.0)
pH: 7 (ref 5.0–8.0)

## 2010-05-05 LAB — GC/CHLAMYDIA PROBE AMP, GENITAL
Chlamydia, DNA Probe: NEGATIVE
GC Probe Amp, Genital: NEGATIVE

## 2010-06-02 NOTE — Op Note (Signed)
Kathy Chaney, Kathy Chaney              ACCOUNT NO.:  [id number]   MEDICAL RECORD NO.:  [id number]          PATIENT TYPE:  INP   LOCATION:  9145                          FACILITY:  [facility]   PHYSICIAN:  Allie Bossier, MD        DATE OF BIRTH:  18-Aug-1975   DATE OF PROCEDURE:  08/21/2008  DATE OF DISCHARGE:                               OPERATIVE REPORT   PREOPERATIVE DIAGNOSIS:  Multiparity, desires sterility.   POSTOPERATIVE DIAGNOSIS:  Multiparity, desires sterility.   PROCEDURE:  Bilateral tubal sterilization procedure with Filshie clip  application.   SURGEON:  Allie Bossier, MD   ANESTHESIA:  Epidural.   COMPLICATIONS:  None.   ESTIMATED BLOOD LOSS:  Minimal.   SPECIMENS:  None.   FINDINGS:  Normal-appearing oviducts.   DETAILED PROCEDURE AND FINDINGS:  The risks, benefits, alternatives, and  0.5% failure rate were explained, understood and accepted.  She declines  alternative forms of birth control.  Her epidural was bolused for  surgery.  She was taken to the operating room.  Her abdomen was prepped  and draped in the usual sterile fashion.  After adequate anesthesia was  assured, a transverse incision was made in her umbilicus.  Incision was  carried down through the subcutaneous tissue of the fascia.  The fascia  was elevated with Kocher clamps and incised with Mayo scissors.  Peritoneum was elevated with hemostats and incised with Metzenbaum  scissors.  Using an [employer] retractor, I was able to visualize the  oviduct on her right, I grasped it with a Babcock clamp and traced to  the fimbriated end.  I retraced it to the isthmic region where I put a  Filshie clip across the entire oviduct.  I injected approximately 1 mL  of 0.5% Marcaine just distal to the clip for postop pain management.  I  did a repeat procedure on the left side.  Again, the fimbriated end of  the tube was visualized and the isthmic region is were the Filshie clip  was placed.  This site also received  Marcaine injection just distal to  the clamp.  The tubes were allowed to fall back in the abdominal cavity.  The fascia and peritoneum together were grasped with Allis clamps and  closed with a 0-Vicryl running nonlocking suture.  No defects were  palpable.  The subcutaneous tissue was infiltrated with the  remaining 8 mL of 0.5% Marcaine.  Subcuticular closure was done with 4-0  Vicryl suture.  Steri-Strips were placed across the incision.  Instruments, sponge, and needle counts were correct.  A Foley catheter  was placed at the end of the case.  Per protocol, clear urine was noted.      Allie Bossier, MD  Electronically Signed     MCD/MEDQ  D:  08/21/2008  T:  08/22/2008  Job:  [id number]

## 2010-07-27 ENCOUNTER — Other Ambulatory Visit (HOSPITAL_COMMUNITY): Payer: Self-pay | Admitting: Interventional Radiology

## 2010-07-27 DIAGNOSIS — I729 Aneurysm of unspecified site: Secondary | ICD-10-CM

## 2010-07-27 DIAGNOSIS — E519 Thiamine deficiency, unspecified: Secondary | ICD-10-CM

## 2010-07-31 ENCOUNTER — Ambulatory Visit (HOSPITAL_COMMUNITY)
Admission: RE | Admit: 2010-07-31 | Discharge: 2010-07-31 | Disposition: A | Discharge status: Home / Self Care | Payer: BC Managed Care – PPO | Source: Ambulatory Visit | Attending: Interventional Radiology | Admitting: Interventional Radiology

## 2010-07-31 ENCOUNTER — Other Ambulatory Visit (HOSPITAL_COMMUNITY): Payer: Self-pay | Admitting: Interventional Radiology

## 2010-07-31 DIAGNOSIS — R110 Nausea: Secondary | ICD-10-CM | POA: Insufficient documentation

## 2010-07-31 DIAGNOSIS — I729 Aneurysm of unspecified site: Secondary | ICD-10-CM

## 2010-07-31 DIAGNOSIS — J3489 Other specified disorders of nose and nasal sinuses: Secondary | ICD-10-CM | POA: Insufficient documentation

## 2010-07-31 DIAGNOSIS — H0520 Unspecified exophthalmos: Secondary | ICD-10-CM | POA: Insufficient documentation

## 2010-07-31 DIAGNOSIS — R51 Headache: Secondary | ICD-10-CM | POA: Insufficient documentation

## 2010-07-31 DIAGNOSIS — H538 Other visual disturbances: Secondary | ICD-10-CM | POA: Insufficient documentation

## 2010-07-31 DIAGNOSIS — I671 Cerebral aneurysm, nonruptured: Secondary | ICD-10-CM | POA: Insufficient documentation

## 2010-07-31 LAB — BUN: BUN: 8 mg/dL (ref 6–23)

## 2010-07-31 MED ORDER — GADOBENATE DIMEGLUMINE 529 MG/ML IV SOLN
13.0000 mL | Freq: Once | INTRAVENOUS | Status: AC
  Administered 2010-07-31: 13 mL via INTRAVENOUS

## 2010-08-03 ENCOUNTER — Encounter (INDEPENDENT_AMBULATORY_CARE_PROVIDER_SITE_OTHER): Payer: BC Managed Care – PPO | Admitting: General Surgery

## 2010-08-04 ENCOUNTER — Ambulatory Visit (HOSPITAL_COMMUNITY)
Admission: RE | Admit: 2010-08-04 | Discharge: 2010-08-04 | Disposition: A | Discharge status: Home / Self Care | Payer: BC Managed Care – PPO | Source: Ambulatory Visit | Attending: Interventional Radiology | Admitting: Interventional Radiology

## 2010-08-04 ENCOUNTER — Other Ambulatory Visit (HOSPITAL_COMMUNITY): Payer: Self-pay | Admitting: Interventional Radiology

## 2010-08-04 DIAGNOSIS — I10 Essential (primary) hypertension: Secondary | ICD-10-CM | POA: Insufficient documentation

## 2010-08-04 DIAGNOSIS — Z87891 Personal history of nicotine dependence: Secondary | ICD-10-CM | POA: Insufficient documentation

## 2010-08-04 DIAGNOSIS — Z01812 Encounter for preprocedural laboratory examination: Secondary | ICD-10-CM | POA: Insufficient documentation

## 2010-08-04 DIAGNOSIS — I729 Aneurysm of unspecified site: Secondary | ICD-10-CM

## 2010-08-04 DIAGNOSIS — Z9889 Other specified postprocedural states: Secondary | ICD-10-CM | POA: Insufficient documentation

## 2010-08-04 DIAGNOSIS — R51 Headache: Secondary | ICD-10-CM | POA: Insufficient documentation

## 2010-08-04 DIAGNOSIS — I671 Cerebral aneurysm, nonruptured: Secondary | ICD-10-CM | POA: Insufficient documentation

## 2010-08-04 DIAGNOSIS — R9409 Abnormal results of other function studies of central nervous system: Secondary | ICD-10-CM | POA: Insufficient documentation

## 2010-08-04 LAB — POCT I-STAT, CHEM 8
BUN: 5 mg/dL — ABNORMAL LOW (ref 6–23)
Chloride: 103 mEq/L (ref 96–112)
Creatinine, Ser: 0.7 mg/dL (ref 0.50–1.10)
Glucose, Bld: 94 mg/dL (ref 70–99)
HCT: 40 % (ref 36.0–46.0)
Potassium: 3.6 mEq/L (ref 3.5–5.1)

## 2010-08-04 LAB — CBC
HCT: 37.5 % (ref 36.0–46.0)
MCH: 31 pg (ref 26.0–34.0)
MCHC: 35.2 g/dL (ref 30.0–36.0)
MCV: 88 fL (ref 78.0–100.0)
RDW: 14.1 % (ref 11.5–15.5)

## 2010-08-04 MED ORDER — IOHEXOL 300 MG/ML  SOLN
150.0000 mL | Freq: Once | INTRAMUSCULAR | Status: AC | PRN
  Administered 2010-08-04: 60 mL via INTRA_ARTERIAL

## 2010-08-17 ENCOUNTER — Encounter (INDEPENDENT_AMBULATORY_CARE_PROVIDER_SITE_OTHER): Payer: BC Managed Care – PPO | Admitting: General Surgery

## 2010-11-03 ENCOUNTER — Other Ambulatory Visit (HOSPITAL_COMMUNITY): Payer: Self-pay | Admitting: Interventional Radiology

## 2010-11-03 DIAGNOSIS — I725 Aneurysm of other precerebral arteries: Secondary | ICD-10-CM

## 2010-11-03 DIAGNOSIS — Z09 Encounter for follow-up examination after completed treatment for conditions other than malignant neoplasm: Secondary | ICD-10-CM

## 2010-11-06 ENCOUNTER — Encounter (HOSPITAL_COMMUNITY)
Admission: RE | Admit: 2010-11-06 | Discharge: 2010-11-06 | Disposition: A | Discharge status: Home / Self Care | Payer: BC Managed Care – PPO | Source: Ambulatory Visit | Attending: Interventional Radiology | Admitting: Interventional Radiology

## 2010-11-06 LAB — COMPREHENSIVE METABOLIC PANEL
ALT: 10 U/L (ref 0–35)
AST: 15 U/L (ref 0–37)
Albumin: 4.2 g/dL (ref 3.5–5.2)
Alkaline Phosphatase: 66 U/L (ref 39–117)
Chloride: 101 mEq/L (ref 96–112)
Potassium: 3.5 mEq/L (ref 3.5–5.1)
Sodium: 139 mEq/L (ref 135–145)
Total Bilirubin: 0.3 mg/dL (ref 0.3–1.2)

## 2010-11-06 LAB — APTT: aPTT: 25 seconds (ref 24–37)

## 2010-11-06 LAB — DIFFERENTIAL
Eosinophils Relative: 1 % (ref 0–5)
Lymphocytes Relative: 27 % (ref 12–46)
Lymphs Abs: 2.4 10*3/uL (ref 0.7–4.0)
Monocytes Absolute: 0.5 10*3/uL (ref 0.1–1.0)

## 2010-11-06 LAB — URINALYSIS, ROUTINE W REFLEX MICROSCOPIC
Bilirubin Urine: NEGATIVE
Hgb urine dipstick: NEGATIVE
Specific Gravity, Urine: 1.01 (ref 1.005–1.030)
Urobilinogen, UA: 0.2 mg/dL (ref 0.0–1.0)
pH: 6.5 (ref 5.0–8.0)

## 2010-11-06 LAB — CBC
HCT: 38.7 % (ref 36.0–46.0)
MCV: 89.6 fL (ref 78.0–100.0)
RDW: 13 % (ref 11.5–15.5)
WBC: 9 10*3/uL (ref 4.0–10.5)

## 2010-11-06 LAB — PROTIME-INR: INR: 0.96 (ref 0.00–1.49)

## 2010-11-12 ENCOUNTER — Ambulatory Visit (HOSPITAL_COMMUNITY)
Admission: RE | Admit: 2010-11-12 | Discharge: 2010-11-12 | Disposition: A | Discharge status: Home / Self Care | Payer: BC Managed Care – PPO | Source: Ambulatory Visit | Attending: Interventional Radiology | Admitting: Interventional Radiology

## 2010-11-12 DIAGNOSIS — Z01812 Encounter for preprocedural laboratory examination: Secondary | ICD-10-CM | POA: Insufficient documentation

## 2010-11-12 DIAGNOSIS — Z9889 Other specified postprocedural states: Secondary | ICD-10-CM | POA: Insufficient documentation

## 2010-11-12 DIAGNOSIS — R51 Headache: Secondary | ICD-10-CM | POA: Insufficient documentation

## 2010-11-12 DIAGNOSIS — I725 Aneurysm of other precerebral arteries: Secondary | ICD-10-CM

## 2010-11-12 DIAGNOSIS — I10 Essential (primary) hypertension: Secondary | ICD-10-CM | POA: Insufficient documentation

## 2010-11-12 DIAGNOSIS — I671 Cerebral aneurysm, nonruptured: Secondary | ICD-10-CM | POA: Insufficient documentation

## 2010-11-12 DIAGNOSIS — Z09 Encounter for follow-up examination after completed treatment for conditions other than malignant neoplasm: Secondary | ICD-10-CM

## 2010-11-12 MED ORDER — IOHEXOL 300 MG/ML  SOLN
150.0000 mL | Freq: Once | INTRAMUSCULAR | Status: AC | PRN
  Administered 2010-11-12: 36 mL via INTRA_ARTERIAL

## 2010-12-18 ENCOUNTER — Ambulatory Visit (INDEPENDENT_AMBULATORY_CARE_PROVIDER_SITE_OTHER)
Admission: RE | Admit: 2010-12-18 | Discharge: 2010-12-18 | Disposition: A | Discharge status: Home / Self Care | Payer: BC Managed Care – PPO | Source: Ambulatory Visit | Attending: Internal Medicine | Admitting: Internal Medicine

## 2010-12-18 ENCOUNTER — Encounter: Payer: Self-pay | Admitting: Internal Medicine

## 2010-12-18 ENCOUNTER — Ambulatory Visit (INDEPENDENT_AMBULATORY_CARE_PROVIDER_SITE_OTHER): Payer: BC Managed Care – PPO | Admitting: Internal Medicine

## 2010-12-18 VITALS — BP 110/66 | HR 88 | Temp 97.4°F | Resp 16 | Ht 62.0 in | Wt 139.1 lb

## 2010-12-18 DIAGNOSIS — R05 Cough: Secondary | ICD-10-CM

## 2010-12-18 DIAGNOSIS — R059 Cough, unspecified: Secondary | ICD-10-CM

## 2010-12-18 DIAGNOSIS — F17200 Nicotine dependence, unspecified, uncomplicated: Secondary | ICD-10-CM

## 2010-12-18 DIAGNOSIS — I671 Cerebral aneurysm, nonruptured: Secondary | ICD-10-CM

## 2010-12-18 DIAGNOSIS — I10 Essential (primary) hypertension: Secondary | ICD-10-CM

## 2010-12-18 DIAGNOSIS — J209 Acute bronchitis, unspecified: Secondary | ICD-10-CM

## 2010-12-18 DIAGNOSIS — M542 Cervicalgia: Secondary | ICD-10-CM | POA: Insufficient documentation

## 2010-12-18 MED ORDER — PSEUDOEPH-CHLORPHEN-HYDROCOD 60-4-5 MG/5ML PO SOLN: 5.0000 mL | Freq: Four times a day (QID) | ORAL | Status: DC | PRN

## 2010-12-18 MED ORDER — AZITHROMYCIN 500 MG PO TABS: 500.0000 mg | ORAL_TABLET | Freq: Every day | ORAL | Status: AC

## 2010-12-18 NOTE — Patient Instructions (Signed)

## 2010-12-18 NOTE — Progress Notes (Signed)
Subjective:    Patient ID: Kathy Chaney, female    DOB: 1975-11-27, 35 y.o.   MRN: [id number]  Neck Pain  This is a recurrent problem. The current episode started more than 1 month ago. The problem occurs intermittently. The problem has been gradually worsening. The pain is associated with nothing. The pain is present in the right side and midline. The quality of the pain is described as aching and shooting. The pain is at a severity of 2/10. The pain is mild. The symptoms are aggravated by position. The pain is worse during the day. Stiffness is present in the morning. Pertinent negatives include no chest pain, fever, headaches, leg pain, numbness, pain with swallowing, paresis, photophobia, syncope, tingling, trouble swallowing, visual change, weakness or weight loss. She has tried nothing for the symptoms.  Cough This is a new problem. Episode onset: 2 weeks. The problem has been gradually worsening. The problem occurs every few minutes. The cough is productive of purulent sputum. Associated symptoms include chills. Pertinent negatives include no chest pain, ear congestion, ear pain, fever, headaches, heartburn, hemoptysis, myalgias, nasal congestion, postnasal drip, rash, rhinorrhea, sore throat, shortness of breath, sweats, weight loss or wheezing. The symptoms are aggravated by nothing. She has tried OTC cough suppressant for the symptoms. The treatment provided no relief.      Review of Systems  Constitutional: Positive for chills. Negative for fever, weight loss, diaphoresis, activity change, appetite change, fatigue and unexpected weight change.  HENT: Positive for neck pain. Negative for ear pain, sore throat, facial swelling, rhinorrhea, trouble swallowing, voice change and postnasal drip.   Eyes: Negative.  Negative for photophobia.  Respiratory: Positive for cough. Negative for apnea, hemoptysis, choking, chest tightness, shortness of breath, wheezing and stridor.   Cardiovascular:  Negative for chest pain, palpitations, leg swelling and syncope.  Gastrointestinal: Negative for heartburn, nausea, vomiting, abdominal pain, diarrhea, constipation and abdominal distention.  Genitourinary: Negative for dysuria, urgency, frequency, hematuria, flank pain, decreased urine volume, enuresis, difficulty urinating and dyspareunia.  Musculoskeletal: Negative for myalgias, back pain, joint swelling, arthralgias and gait problem.  Skin: Negative for color change, pallor, rash and wound.  Neurological: Negative for dizziness, tingling, tremors, seizures, syncope, facial asymmetry, speech difficulty, weakness, light-headedness, numbness and headaches.  Hematological: Negative for adenopathy. Does not bruise/bleed easily.  Psychiatric/Behavioral: Negative.        Objective:   Physical Exam  Vitals reviewed. Constitutional: She appears well-developed and well-nourished. No distress.  HENT:  Head: Normocephalic and atraumatic.  Mouth/Throat: Oropharynx is clear and moist. No oropharyngeal exudate.  Eyes: Conjunctivae are normal. Right eye exhibits no discharge. Left eye exhibits no discharge. No scleral icterus.  Neck: Normal range of motion. Neck supple. No JVD present. No tracheal deviation present. No thyromegaly present.  Cardiovascular: Normal rate, regular rhythm, normal heart sounds and intact distal pulses.  Exam reveals no gallop and no friction rub.   No murmur heard. Pulmonary/Chest: Effort normal and breath sounds normal. No stridor. No respiratory distress. She has no wheezes. She has no rales. She exhibits no tenderness.  Abdominal: Soft. Bowel sounds are normal. She exhibits no distension and no mass. There is no tenderness. There is no rebound and no guarding.  Musculoskeletal: Normal range of motion. She exhibits no edema and no tenderness.       Cervical back: Normal. She exhibits normal range of motion, no tenderness, no bony tenderness, no swelling, no edema, no  deformity, no laceration, no pain, no spasm and normal pulse.  Lymphadenopathy:    She has no cervical adenopathy.  Neurological: She is alert. She has normal strength. She displays no atrophy, no tremor and normal reflexes. No cranial nerve deficit or sensory deficit. She exhibits normal muscle tone. She displays a negative Romberg sign. She displays no seizure activity. Coordination and gait normal. She displays no Babinski's sign on the right side. She displays no Babinski's sign on the left side.  Reflex Scores:      Tricep reflexes are 1+ on the right side and 1+ on the left side.      Bicep reflexes are 1+ on the right side and 1+ on the left side.      Brachioradialis reflexes are 1+ on the right side and 1+ on the left side.      Patellar reflexes are 1+ on the right side and 1+ on the left side.      Achilles reflexes are 1+ on the right side and 1+ on the left side. Skin: Skin is warm and dry. No rash noted. She is not diaphoretic. No erythema. No pallor.  Psychiatric: She has a normal mood and affect. Her behavior is normal. Judgment and thought content normal.          Assessment & Plan:

## 2010-12-20 ENCOUNTER — Encounter: Payer: Self-pay | Admitting: Internal Medicine

## 2010-12-20 NOTE — Assessment & Plan Note (Signed)
She has not had any recent problems with this

## 2010-12-20 NOTE — Assessment & Plan Note (Signed)
She will start using nicotine patches

## 2010-12-20 NOTE — Assessment & Plan Note (Signed)
I will check a CXR to look for PNA, mass, edema

## 2010-12-20 NOTE — Assessment & Plan Note (Signed)
Will start zpak for the infection and zutripro for the cough

## 2010-12-20 NOTE — Assessment & Plan Note (Signed)
I will check a plain film of her neck to look for a cause of her neck pain

## 2010-12-22 ENCOUNTER — Encounter: Payer: Self-pay | Admitting: Internal Medicine

## 2010-12-22 ENCOUNTER — Telehealth: Payer: Self-pay

## 2010-12-22 DIAGNOSIS — M542 Cervicalgia: Secondary | ICD-10-CM

## 2010-12-22 NOTE — Telephone Encounter (Signed)
Patient called requesting results of recent xray Thanks

## 2010-12-23 ENCOUNTER — Encounter: Payer: Self-pay | Admitting: Internal Medicine

## 2010-12-23 MED ORDER — METHOCARBAMOL 500 MG PO TABS: 500.0000 mg | ORAL_TABLET | Freq: Four times a day (QID) | ORAL | Status: AC

## 2010-12-23 NOTE — Telephone Encounter (Signed)
"  neck spasms"

## 2010-12-23 NOTE — Telephone Encounter (Signed)
Some muscle relaxers, Rx was sent in

## 2010-12-23 NOTE — Telephone Encounter (Signed)
Patient notified of xray results and would like to know what MD suggest can be done or given to help out with pain.

## 2011-01-01 ENCOUNTER — Ambulatory Visit: Payer: BC Managed Care – PPO | Admitting: Internal Medicine

## 2011-01-01 DIAGNOSIS — Z0289 Encounter for other administrative examinations: Secondary | ICD-10-CM

## 2011-01-25 ENCOUNTER — Telehealth (HOSPITAL_COMMUNITY): Payer: Self-pay

## 2011-01-25 NOTE — Telephone Encounter (Signed)
Pt called to give her new number [phone]

## 2011-04-20 ENCOUNTER — Other Ambulatory Visit (HOSPITAL_COMMUNITY): Payer: Self-pay | Admitting: Interventional Radiology

## 2011-04-20 DIAGNOSIS — R413 Other amnesia: Secondary | ICD-10-CM

## 2011-04-20 DIAGNOSIS — R51 Headache: Secondary | ICD-10-CM

## 2011-04-20 DIAGNOSIS — I729 Aneurysm of unspecified site: Secondary | ICD-10-CM

## 2011-04-23 ENCOUNTER — Ambulatory Visit (HOSPITAL_COMMUNITY)
Admission: RE | Admit: 2011-04-23 | Discharge: 2011-04-23 | Disposition: A | Discharge status: Home / Self Care | Payer: BC Managed Care – PPO | Source: Ambulatory Visit | Attending: Interventional Radiology | Admitting: Interventional Radiology

## 2011-04-23 ENCOUNTER — Ambulatory Visit (HOSPITAL_COMMUNITY): Payer: BC Managed Care – PPO

## 2011-04-23 DIAGNOSIS — R413 Other amnesia: Secondary | ICD-10-CM | POA: Insufficient documentation

## 2011-04-23 DIAGNOSIS — F29 Unspecified psychosis not due to a substance or known physiological condition: Secondary | ICD-10-CM | POA: Insufficient documentation

## 2011-04-23 DIAGNOSIS — I671 Cerebral aneurysm, nonruptured: Secondary | ICD-10-CM | POA: Insufficient documentation

## 2011-04-23 DIAGNOSIS — R51 Headache: Secondary | ICD-10-CM | POA: Insufficient documentation

## 2011-04-23 DIAGNOSIS — I729 Aneurysm of unspecified site: Secondary | ICD-10-CM

## 2011-04-23 MED ORDER — GADOBENATE DIMEGLUMINE 529 MG/ML IV SOLN
13.0000 mL | Freq: Once | INTRAVENOUS | Status: AC | PRN
  Administered 2011-04-23: 13 mL via INTRAVENOUS

## 2011-07-08 ENCOUNTER — Ambulatory Visit (INDEPENDENT_AMBULATORY_CARE_PROVIDER_SITE_OTHER): Payer: BC Managed Care – PPO | Admitting: Internal Medicine

## 2011-07-08 ENCOUNTER — Other Ambulatory Visit: Payer: BC Managed Care – PPO

## 2011-07-08 ENCOUNTER — Encounter: Payer: Self-pay | Admitting: Internal Medicine

## 2011-07-08 VITALS — BP 118/72 | HR 78 | Temp 97.8°F | Resp 16 | Wt 135.0 lb

## 2011-07-08 DIAGNOSIS — R002 Palpitations: Secondary | ICD-10-CM

## 2011-07-08 DIAGNOSIS — N390 Urinary tract infection, site not specified: Secondary | ICD-10-CM

## 2011-07-08 DIAGNOSIS — I10 Essential (primary) hypertension: Secondary | ICD-10-CM

## 2011-07-08 DIAGNOSIS — I671 Cerebral aneurysm, nonruptured: Secondary | ICD-10-CM

## 2011-07-08 LAB — POCT URINALYSIS DIPSTICK
Bilirubin, UA: NEGATIVE
Ketones, UA: NEGATIVE
Spec Grav, UA: <=1.005
pH, UA: 8

## 2011-07-08 MED ORDER — CIPROFLOXACIN HCL 250 MG PO TABS: 250.0000 mg | ORAL_TABLET | Freq: Two times a day (BID) | ORAL | Status: AC

## 2011-07-08 NOTE — Progress Notes (Signed)
Subjective:    Patient ID: Kathy Chaney, female    DOB: 1975-04-23, 36 y.o.   MRN: [id number]  Dysuria  This is a new problem. The current episode started in the past 7 days. The problem occurs every urination. The problem has been gradually worsening. The quality of the pain is described as burning. The pain is at a severity of 1/10. The patient is experiencing no pain. There has been no fever. She is sexually active. There is no history of pyelonephritis. Associated symptoms include frequency and hesitancy. Pertinent negatives include no chills, discharge, flank pain, hematuria, nausea, possible pregnancy, sweats, urgency or vomiting. She has tried nothing for the symptoms.  Palpitations  This is a new problem. The current episode started 1 to 4 weeks ago. The problem occurs intermittently. The problem has been unchanged. Episode Length: 2. Nothing aggravates the symptoms. Associated symptoms include chest fullness and dizziness. Pertinent negatives include no anxiety, chest pain, coughing, diaphoresis, fever, irregular heartbeat, malaise/fatigue, nausea, near-syncope, numbness, shortness of breath, syncope, vomiting or weakness. She has tried nothing for the symptoms.      Review of Systems  Constitutional: Negative for fever, chills, malaise/fatigue, diaphoresis, activity change, appetite change, fatigue and unexpected weight change.  HENT: Positive for neck pain and neck stiffness. Negative for hearing loss, ear pain, nosebleeds, congestion, facial swelling, rhinorrhea, trouble swallowing, tinnitus and ear discharge.   Eyes: Negative.   Respiratory: Negative for apnea, cough, choking, chest tightness, shortness of breath, wheezing and stridor.   Cardiovascular: Positive for palpitations. Negative for chest pain, leg swelling, syncope and near-syncope.  Gastrointestinal: Negative for nausea, vomiting, abdominal pain, diarrhea, blood in stool, abdominal distention, anal bleeding and rectal  pain.  Genitourinary: Positive for dysuria, hesitancy and frequency. Negative for urgency, hematuria, flank pain, decreased urine volume, vaginal bleeding, vaginal discharge, enuresis, difficulty urinating, vaginal pain, menstrual problem, pelvic pain and dyspareunia.  Skin: Negative for color change, pallor and wound.  Neurological: Positive for dizziness and headaches. Negative for tremors, seizures, syncope, facial asymmetry, speech difficulty, weakness, light-headedness and numbness.  Hematological: Negative for adenopathy. Does not bruise/bleed easily.  Psychiatric/Behavioral: Negative.        Objective:   Physical Exam  Vitals reviewed. Constitutional: She is oriented to person, place, and time. Vital signs are normal. She appears well-developed and well-nourished.  Non-toxic appearance. She does not have a sickly appearance. She does not appear ill. No distress.  HENT:  Head: Normocephalic and atraumatic.  Mouth/Throat: Oropharynx is clear and moist. No oropharyngeal exudate.  Eyes: Conjunctivae are normal. Right eye exhibits no discharge. Left eye exhibits no discharge. No scleral icterus.  Neck: Normal range of motion. Neck supple. No JVD present. No tracheal deviation present. No thyromegaly present.  Cardiovascular: Normal rate, regular rhythm, normal heart sounds and intact distal pulses.  Exam reveals no gallop and no friction rub.   No murmur heard. Pulmonary/Chest: Effort normal and breath sounds normal. No stridor. No respiratory distress. She has no wheezes. She has no rales. She exhibits no tenderness.  Abdominal: Soft. Bowel sounds are normal. She exhibits no distension and no mass. There is no tenderness. There is no rebound and no guarding.  Musculoskeletal: Normal range of motion. She exhibits no edema and no tenderness.  Lymphadenopathy:    She has no cervical adenopathy.  Neurological: She is alert and oriented to person, place, and time. She has normal strength. She  displays no atrophy, no tremor and normal reflexes. No cranial nerve deficit or sensory deficit.  She exhibits normal muscle tone. She displays a negative Romberg sign. She displays no seizure activity. Coordination and gait normal. She displays no Babinski's sign on the right side. She displays no Babinski's sign on the left side.  Reflex Scores:      Tricep reflexes are 1+ on the right side and 1+ on the left side.      Bicep reflexes are 1+ on the right side and 1+ on the left side.      Brachioradialis reflexes are 1+ on the right side and 1+ on the left side.      Patellar reflexes are 1+ on the right side and 1+ on the left side.      Achilles reflexes are 1+ on the right side and 1+ on the left side. Skin: Skin is warm and dry. No rash noted. She is not diaphoretic. No erythema. No pallor.  Psychiatric: She has a normal mood and affect. Her behavior is normal. Judgment and thought content normal.      Mr Shirlee Latch Wo Contrast  04/23/2011  *RADIOLOGY REPORT*  Clinical Data:  History of aneurysm coiling.  New onset headaches, memory loss, confusion  MRI HEAD WITHOUT AND WITH CONTRAST MRA HEAD WITHOUT CONTRAST  Technique:  Multiplanar, multiecho pulse sequences of the brain and surrounding structures were obtained without and with intravenous contrast.  Angiographic images of the head were obtained using MRA technique without contrast.  Contrast: 13mL MULTIHANCE GADOBENATE DIMEGLUMINE 529 MG/ML IV SOLN  Comparison:  MRI 07/31/2010, angiogram 11/12/2010  MRI HEAD WITHOUT AND WITH CONTRAST  Findings:  Ventricle size is normal.  Negative for acute or chronic infarct.  Cerebral white matter is normal.  Brainstem is normal. Susceptibility artifact in the subarachnoid space anterior to the left mid brain compatible with aneurysm coiling.  No evidence of intracranial hemorrhage.  No mass or edema is present.  IMPRESSION: No significant abnormality of the brain.  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  There is  flow rate related enhancement in the neck of the aneurysm.  This now measures approximately 1.8 x 2.8 mm and is similar to  the MRI of 07/31/2010.  Both vertebral arteries are patent to the basilar.  PICA is patent bilaterally.  Superior cerebellar and posterior cerebral arteries are patent bilaterally without significant stenosis.  Patent right posterior communicating artery.  Internal carotid artery is widely patent bilaterally.  Anterior and middle cerebral arteries are patent bilaterally.  No other aneurysms are present.  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  Original Report Authenticated By: Camelia Phenes, M.D.   Mr Laqueta Jean Wo Contrast  04/23/2011  *RADIOLOGY REPORT*  Clinical Data:  History of aneurysm coiling.  New onset headaches, memory loss, confusion  MRI HEAD WITHOUT AND WITH CONTRAST MRA HEAD WITHOUT CONTRAST  Technique:  Multiplanar, multiecho pulse sequences of the brain and surrounding structures were obtained without and with intravenous contrast.  Angiographic images of the head were obtained using MRA technique without contrast.  Contrast: 13mL MULTIHANCE GADOBENATE DIMEGLUMINE 529 MG/ML IV SOLN  Comparison:  MRI 07/31/2010, angiogram 11/12/2010  MRI HEAD WITHOUT AND WITH CONTRAST  Findings:  Ventricle size is normal.  Negative for acute or chronic infarct.  Cerebral white matter is normal.  Brainstem is  normal. Susceptibility artifact in the subarachnoid space anterior to the left mid brain compatible with aneurysm coiling.  No evidence of intracranial hemorrhage.  No mass or edema is present.  IMPRESSION: No significant abnormality of the brain.  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  MRA HEAD  Findings: Aneurysm coils are present to the left of  the distal basilar artery, unchanged.  There is  flow rate related enhancement in the neck of the aneurysm.  This now measures approximately 1.8 x 2.8 mm and is similar to  the MRI of 07/31/2010.  Both vertebral arteries are patent to the basilar.  PICA is patent bilaterally.  Superior cerebellar and posterior cerebral arteries are patent bilaterally without significant stenosis.  Patent right posterior communicating artery.  Internal carotid artery is widely patent bilaterally.  Anterior and middle cerebral arteries are patent bilaterally.  No other aneurysms are present.  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  Original Report Authenticated By: Camelia Phenes, M.D.     Assessment & Plan:

## 2011-07-08 NOTE — Patient Instructions (Signed)
Urinary Tract Infection Infections of the urinary tract can start in several places. A bladder infection (cystitis), a kidney infection (pyelonephritis), and a prostate infection (prostatitis) are different types of urinary tract infections (UTIs). They usually get better if treated with medicines (antibiotics) that kill germs. Take all the medicine until it is gone. You or your child may feel better in a few days, but TAKE ALL MEDICINE or the infection may not respond and may become more difficult to treat. HOME CARE INSTRUCTIONS   Drink enough water and fluids to keep the urine clear or pale yellow. Cranberry juice is especially recommended, in addition to large amounts of water.   Avoid caffeine, tea, and carbonated beverages. They tend to irritate the bladder.   Alcohol may irritate the prostate.   Only take over-the-counter or prescription medicines for pain, discomfort, or fever as directed by your caregiver.  To prevent further infections:  Empty the bladder often. Avoid holding urine for long periods of time.   After a bowel movement, women should cleanse from front to back. Use each tissue only once.   Empty the bladder before and after sexual intercourse.  FINDING OUT THE RESULTS OF YOUR TEST Not all test results are available during your visit. If your or your child's test results are not back during the visit, make an appointment with your caregiver to find out the results. Do not assume everything is normal if you have not heard from your caregiver or the medical facility. It is important for you to follow up on all test results. SEEK MEDICAL CARE IF:   There is back pain.   Your baby is older than 3 months with a rectal temperature of 100.5 F (38.1 C) or higher for more than 1 day.   Your or your child's problems (symptoms) are no better in 3 days. Return sooner if you or your child is getting worse.  SEEK IMMEDIATE MEDICAL CARE IF:   There is severe back pain or lower  abdominal pain.   You or your child develops chills.   You have a fever.   Your baby is older than 3 months with a rectal temperature of 102 F (38.9 C) or higher.   Your baby is 14 months old or younger with a rectal temperature of 100.4 F (38 C) or higher.   There is nausea or vomiting.   There is continued burning or discomfort with urination.  MAKE SURE YOU:   Understand these instructions.   Will watch your condition.   Will get help right away if you are not doing well or get worse.  Document Released: 10/14/2004 Document Revised: 12/24/2010 Document Reviewed: 05/19/2006 [facility] Patient Information 2012 [place], [place].Palpitations  A palpitation is the feeling that your heartbeat is irregular or is faster than normal. Although this is frightening, it usually is not serious. Palpitations may be caused by excesses of smoking, caffeine, or alcohol. They are also brought on by stress and anxiety. Sometimes, they are caused by heart disease. Unless otherwise noted, your caregiver did not find any signs of serious illness at this time. HOME CARE INSTRUCTIONS  To help prevent palpitations:  Drink decaffeinated coffee, tea, and soda pop. Avoid chocolate.   If you smoke or drink alcohol, quit or cut down as much as possible.   Reduce your stress or anxiety level. Biofeedback, yoga, or meditation will help you relax. Physical activity such as swimming, jogging, or walking also may be helpful.  SEEK MEDICAL CARE IF:  You continue to have a fast heartbeat.   Your palpitations occur more often.  SEEK IMMEDIATE MEDICAL CARE IF: You develop chest pain, shortness of breath, severe headache, dizziness, or fainting. Document Released: 01/02/2000 Document Revised: 12/24/2010 Document Reviewed: 03/03/2007 [facility] Patient Information 2012 [place], [place].

## 2011-07-09 ENCOUNTER — Encounter: Payer: Self-pay | Admitting: Internal Medicine

## 2011-07-09 ENCOUNTER — Other Ambulatory Visit: Payer: BC Managed Care – PPO

## 2011-07-09 DIAGNOSIS — N390 Urinary tract infection, site not specified: Secondary | ICD-10-CM

## 2011-07-09 DIAGNOSIS — R002 Palpitations: Secondary | ICD-10-CM | POA: Insufficient documentation

## 2011-07-09 NOTE — Assessment & Plan Note (Signed)
Start cipro and check ur clx

## 2011-07-09 NOTE — Assessment & Plan Note (Signed)
She does not have any acute s/s, I have referred her to neurology as requested

## 2011-07-09 NOTE — Assessment & Plan Note (Signed)
The palpitations appear to be benign and her EKG is normal today, I have asked her to get an event monitor done to identify the heart rhythm that is causing her symptoms

## 2011-07-09 NOTE — Assessment & Plan Note (Signed)
Her BP is well controlled 

## 2011-07-12 LAB — URINE CULTURE

## 2011-07-21 ENCOUNTER — Ambulatory Visit: Payer: BC Managed Care – PPO | Admitting: Internal Medicine

## 2011-07-26 ENCOUNTER — Ambulatory Visit (INDEPENDENT_AMBULATORY_CARE_PROVIDER_SITE_OTHER): Payer: BC Managed Care – PPO | Admitting: Internal Medicine

## 2011-07-26 ENCOUNTER — Encounter: Payer: Self-pay | Admitting: Internal Medicine

## 2011-07-26 VITALS — BP 126/72 | HR 80 | Temp 97.8°F | Resp 16 | Wt 134.8 lb

## 2011-07-26 DIAGNOSIS — I10 Essential (primary) hypertension: Secondary | ICD-10-CM

## 2011-07-26 DIAGNOSIS — R002 Palpitations: Secondary | ICD-10-CM

## 2011-07-26 NOTE — Assessment & Plan Note (Signed)
Her BP is well controlled 

## 2011-07-26 NOTE — Assessment & Plan Note (Signed)
Event monitor was ordered by me last visit, she has not been called so I reordered an event monitor today, her symptoms continue to be benign but I would like to capture her rhythm while see feels the palpitations

## 2011-07-26 NOTE — Progress Notes (Signed)
  Subjective:    Patient ID: Kathy Chaney, female    DOB: 10/12/1975, 36 y.o.   MRN: [id number]  Palpitations  This is a chronic problem. The current episode started more than 1 month ago. The problem occurs intermittently. The problem has been unchanged. On average, each episode lasts 2 minutes. The symptoms are aggravated by exercise. Pertinent negatives include no anxiety, chest fullness, chest pain, coughing, diaphoresis, dizziness, fever, irregular heartbeat, malaise/fatigue, nausea, near-syncope, numbness, shortness of breath, syncope, vomiting or weakness.      Review of Systems  Constitutional: Negative for fever, chills, malaise/fatigue, diaphoresis, activity change, appetite change, fatigue and unexpected weight change.  HENT: Negative.  Negative for facial swelling, neck pain and neck stiffness.   Eyes: Negative.   Respiratory: Negative for cough, chest tightness, shortness of breath, wheezing and stridor.   Cardiovascular: Positive for palpitations. Negative for chest pain, leg swelling, syncope and near-syncope.  Gastrointestinal: Negative for nausea, vomiting, abdominal pain, diarrhea, constipation and blood in stool.  Genitourinary: Negative for dysuria, urgency, frequency, hematuria, flank pain, decreased urine volume, enuresis, difficulty urinating and dyspareunia.  Musculoskeletal: Negative.   Skin: Negative.   Neurological: Negative.  Negative for dizziness, tremors, seizures, syncope, facial asymmetry, speech difficulty, weakness, light-headedness, numbness and headaches.  Hematological: Negative for adenopathy. Does not bruise/bleed easily.       Objective:   Physical Exam  Vitals reviewed. Constitutional: She is oriented to person, place, and time. She appears well-developed and well-nourished. No distress.  HENT:  Head: Normocephalic and atraumatic.  Mouth/Throat: Oropharynx is clear and moist. No oropharyngeal exudate.  Eyes: Conjunctivae are normal. Right eye  exhibits no discharge. Left eye exhibits no discharge. No scleral icterus.  Neck: Normal range of motion. Neck supple. No JVD present. No tracheal deviation present. No thyromegaly present.  Cardiovascular: Normal rate, regular rhythm, normal heart sounds and intact distal pulses.  Exam reveals no gallop and no friction rub.   No murmur heard. Pulmonary/Chest: Effort normal and breath sounds normal. No stridor. No respiratory distress. She has no wheezes. She has no rales. She exhibits no tenderness.  Abdominal: Soft. Bowel sounds are normal. She exhibits no distension and no mass. There is no tenderness. There is no rebound and no guarding.  Musculoskeletal: Normal range of motion. She exhibits no edema and no tenderness.  Lymphadenopathy:    She has no cervical adenopathy.  Neurological: She is oriented to person, place, and time.  Skin: Skin is warm and dry. No rash noted. She is not diaphoretic. No erythema. No pallor.  Psychiatric: She has a normal mood and affect. Her behavior is normal. Judgment and thought content normal.          Assessment & Plan:

## 2011-07-26 NOTE — Patient Instructions (Signed)
Palpitations  A palpitation is the feeling that your heartbeat is irregular or is faster than normal. Although this is frightening, it usually is not serious. Palpitations may be caused by excesses of smoking, caffeine, or alcohol. They are also brought on by stress and anxiety. Sometimes, they are caused by heart disease. Unless otherwise noted, your caregiver did not find any signs of serious illness at this time. HOME CARE INSTRUCTIONS  To help prevent palpitations:  Drink decaffeinated coffee, tea, and soda pop. Avoid chocolate.   If you smoke or drink alcohol, quit or cut down as much as possible.   Reduce your stress or anxiety level. Biofeedback, yoga, or meditation will help you relax. Physical activity such as swimming, jogging, or walking also may be helpful.  SEEK MEDICAL CARE IF:   You continue to have a fast heartbeat.   Your palpitations occur more often.  SEEK IMMEDIATE MEDICAL CARE IF: You develop chest pain, shortness of breath, severe headache, dizziness, or fainting. Document Released: 01/02/2000 Document Revised: 12/24/2010 Document Reviewed: 03/03/2007 [employer] Patient Information 2012 [employer]. 

## 2011-08-10 ENCOUNTER — Encounter (INDEPENDENT_AMBULATORY_CARE_PROVIDER_SITE_OTHER): Payer: BC Managed Care – PPO

## 2011-08-10 DIAGNOSIS — R002 Palpitations: Secondary | ICD-10-CM

## 2011-09-01 ENCOUNTER — Emergency Department (HOSPITAL_COMMUNITY): Payer: BC Managed Care – PPO

## 2011-09-01 ENCOUNTER — Emergency Department (HOSPITAL_COMMUNITY)
Admission: EM | Admit: 2011-09-01 | Discharge: 2011-09-01 | Disposition: A | Discharge status: Home / Self Care | Payer: BC Managed Care – PPO | Attending: Emergency Medicine | Admitting: Emergency Medicine

## 2011-09-01 ENCOUNTER — Emergency Department (HOSPITAL_COMMUNITY)
Admission: EM | Admit: 2011-09-01 | Discharge: 2011-09-01 | Disposition: A | Discharge status: Home / Self Care | Payer: BC Managed Care – PPO | Source: Home / Self Care

## 2011-09-01 ENCOUNTER — Encounter (HOSPITAL_COMMUNITY): Payer: Self-pay

## 2011-09-01 DIAGNOSIS — R42 Dizziness and giddiness: Secondary | ICD-10-CM | POA: Insufficient documentation

## 2011-09-01 DIAGNOSIS — R071 Chest pain on breathing: Secondary | ICD-10-CM | POA: Insufficient documentation

## 2011-09-01 DIAGNOSIS — H5710 Ocular pain, unspecified eye: Secondary | ICD-10-CM | POA: Insufficient documentation

## 2011-09-01 DIAGNOSIS — F17200 Nicotine dependence, unspecified, uncomplicated: Secondary | ICD-10-CM | POA: Insufficient documentation

## 2011-09-01 DIAGNOSIS — R51 Headache: Secondary | ICD-10-CM | POA: Insufficient documentation

## 2011-09-01 DIAGNOSIS — R0789 Other chest pain: Secondary | ICD-10-CM | POA: Insufficient documentation

## 2011-09-01 DIAGNOSIS — I10 Essential (primary) hypertension: Secondary | ICD-10-CM | POA: Insufficient documentation

## 2011-09-01 HISTORY — DX: Aneurysm of unspecified site: I72.9

## 2011-09-01 LAB — COMPREHENSIVE METABOLIC PANEL
ALT: 13 U/L (ref 0–35)
AST: 18 U/L (ref 0–37)
Alkaline Phosphatase: 65 U/L (ref 39–117)
CO2: 29 mEq/L (ref 19–32)
Chloride: 105 mEq/L (ref 96–112)
GFR calc Af Amer: >90 mL/min (ref >90)
GFR calc non Af Amer: >90 mL/min (ref >90)
Glucose, Bld: 106 mg/dL — ABNORMAL HIGH (ref 70–99)
Sodium: 143 mEq/L (ref 135–145)
Total Bilirubin: 0.2 mg/dL — ABNORMAL LOW (ref 0.3–1.2)

## 2011-09-01 LAB — CBC WITH DIFFERENTIAL/PLATELET
Basophils Absolute: 0 10*3/uL (ref 0.0–0.1)
Eosinophils Relative: 1 % (ref 0–5)
HCT: 36.9 % (ref 36.0–46.0)
Lymphocytes Relative: 30 % (ref 12–46)
Lymphs Abs: 2 10*3/uL (ref 0.7–4.0)
MCV: 87.9 fL (ref 78.0–100.0)
Neutro Abs: 4.3 10*3/uL (ref 1.7–7.7)
Platelets: 252 10*3/uL (ref 150–400)
RBC: 4.2 MIL/uL (ref 3.87–5.11)
RDW: 13 % (ref 11.5–15.5)
WBC: 6.6 10*3/uL (ref 4.0–10.5)

## 2011-09-01 LAB — URINALYSIS, ROUTINE W REFLEX MICROSCOPIC
Bilirubin Urine: NEGATIVE
Glucose, UA: NEGATIVE mg/dL
Hgb urine dipstick: NEGATIVE
Ketones, ur: NEGATIVE mg/dL
Leukocytes, UA: NEGATIVE
Protein, ur: NEGATIVE mg/dL
pH: 7 (ref 5.0–8.0)

## 2011-09-01 LAB — D-DIMER, QUANTITATIVE: D-Dimer, Quant: <0.22 ug/mL-FEU (ref 0.00–0.48)

## 2011-09-01 MED ORDER — GADOBENATE DIMEGLUMINE 529 MG/ML IV SOLN
12.0000 mL | Freq: Once | INTRAVENOUS | Status: AC
  Administered 2011-09-01: 12 mL via INTRAVENOUS

## 2011-09-01 MED ORDER — KETOROLAC TROMETHAMINE 30 MG/ML IJ SOLN
30.0000 mg | Freq: Once | INTRAMUSCULAR | Status: AC
  Administered 2011-09-01: 30 mg via INTRAVENOUS
  Filled 2011-09-01: qty 1

## 2011-09-01 MED ORDER — IBUPROFEN 800 MG PO TABS: 800.0000 mg | ORAL_TABLET | Freq: Three times a day (TID) | ORAL | Status: AC | PRN

## 2011-09-01 NOTE — ED Notes (Signed)
Patient returned from radiology

## 2011-09-01 NOTE — ED Notes (Addendum)
Pt reports waking this am w/headache, (L) chest pain radiating into (L) neck and (L) arm, (L) "eyeball" pain, dizziness, and nausea. Pt reports a hx of an aneurysm and feels like her sy,ptoms are the same

## 2011-09-01 NOTE — ED Provider Notes (Signed)
History     CSN: [id number]  Arrival date & time 09/01/11  1225   First MD Initiated Contact with Patient 09/01/11 1504      Chief Complaint  Patient presents with  . Chest Pain    (Consider location/radiation/quality/duration/timing/severity/associated sxs/prior treatment) HPI Patient since emergency department with left-sided headache left arm and neck pain.  Patient also, states she has had chest pain for the last month.  Patient's issues seen by her primary care doctor, who placed her on a monitor to monitor her heart.  The patient states that the symptoms she is having with her headache are similar to her previous aneurysmal symptoms.  Patient states that she does not have shortness of breath, vomiting, weakness, numbness gait disturbances, fevers, cough, sore throat, abdominal pain, or diarrhea.  Patient's chest pain, has been constant in nature. Past Medical History  Diagnosis Date  . Hypertension   . Headache   . Low back pain   . Aneurysm     Past Surgical History  Procedure Date  . Tubal ligation     Family History  Problem Relation Age of Onset  . Adopted: Yes  . Alcohol abuse Neg Hx   . Cancer Neg Hx   . Early death Neg Hx   . Heart disease Neg Hx   . Hyperlipidemia Neg Hx   . Hypertension Neg Hx   . Stroke Neg Hx     History  Substance Use Topics  . Smoking status: Current Everyday Smoker -- 0.5 packs/day for 15 years    Types: Cigarettes  . Smokeless tobacco: Not on file  . Alcohol Use: No    OB History    Grav Para Term Preterm Abortions TAB SAB Ect Mult Living                  Review of Systems All other systems negative except as documented in the HPI. All pertinent positives and negatives as reviewed in the HPI.  Allergies  Review of patient's allergies indicates no known allergies.  Home Medications  No current outpatient prescriptions on file.  BP 149/91  Pulse 74  Temp 97.9 F (36.6 C) (Oral)  Resp 16  SpO2 100%  LMP  08/27/2011  Physical Exam  Nursing note and vitals reviewed. Constitutional: She is oriented to person, place, and time. She appears well-developed and well-nourished.  HENT:  Head: Normocephalic and atraumatic.  Eyes: Pupils are equal, round, and reactive to light.  Neck: Normal range of motion. Neck supple.  Cardiovascular: Normal rate, regular rhythm and normal heart sounds.  Exam reveals no gallop and no friction rub.   No murmur heard. Pulmonary/Chest: Effort normal and breath sounds normal. No respiratory distress. She has no wheezes. She has no rales. She exhibits tenderness.  Abdominal: Soft. Bowel sounds are normal. She exhibits no distension. There is no tenderness.  Lymphadenopathy:    She has no cervical adenopathy.  Neurological: She is alert and oriented to person, place, and time.    ED Course  Procedures (including critical care time)  Labs Reviewed  COMPREHENSIVE METABOLIC PANEL - Abnormal; Notable for the following:    Glucose, Bld 106 (*)     Total Bilirubin 0.2 (*)     All other components within normal limits  CBC WITH DIFFERENTIAL  POCT I-STAT TROPONIN I  URINALYSIS, ROUTINE W REFLEX MICROSCOPIC  D-DIMER, QUANTITATIVE   Dg Chest 2 View  09/01/2011  *RADIOLOGY REPORT*  Clinical Data: Chest pain.  Headache.  History  of hypertension. Smoker.  CHEST - 2 VIEW  Comparison: Two-view chest x-ray 12/18/2010 [facility], 02/04/2010 [facility].  Findings: Cardiomediastinal silhouette unremarkable, unchanged. Mild central peribronchial thickening, more so than on the prior examination.  Lungs otherwise clear.  No pleural effusions. Visualized bony thorax intact.  IMPRESSION: Mild changes of acute bronchitis and/or asthma without localized airspace pneumonia.  Original Report Authenticated By: Arnell Sieving, M.D.   Ct Head Wo Contrast  09/01/2011  *RADIOLOGY REPORT*  Clinical Data: I pain and dizziness.  Nausea.  History of aneurysm embolization.  CT  HEAD WITHOUT CONTRAST  Technique:  Contiguous axial images were obtained from the base of the skull through the vertex without contrast.  Comparison: Brain MRI from 04/23/2011  Findings: No evidence for acute hemorrhage, hydrocephalus, mass lesion, or abnormal extra-axial fluid collection.  No CT evidence for acute infarction.  Artifact in the region of the left posterior circle of Willis is consistent with prior aneurysm coiling.  The visualized paranasal sinuses and mastoid air cells are clear.  IMPRESSION: No acute intracranial abnormality.  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  Original Report Authenticated By: ERIC A. MANSELL, M.D.   Mr Maxine Glenn Head Wo Contrast  09/01/2011  *RADIOLOGY REPORT*  Clinical Data:  36 year old female with severe pain behind left eye, headache.  Left upper extremity pain.  History of basilar artery coil embolization.  MRI HEAD WITHOUT AND WITH CONTRAST  Technique: Multiplanar, multiecho pulse sequences of the brain and surrounding structures were obtained according to standard protocol without and with intravenous contrast.  Contrast: 12mL MULTIHANCE GADOBENATE DIMEGLUMINE 529 MG/ML IV SOLN  Comparison: 04/23/2011 and earlier.  Findings:  No restricted diffusion to suggest acute infarction.  No midline shift, mass effect, evidence of mass lesion, ventriculomegaly, extra-axial collection or acute intracranial hemorrhage.  Cervicomedullary junction and pituitary are within normal limits.  Major intracranial vascular flow voids are preserved. Wallace Cullens and white matter signal is within normal limits throughout the brain.  Susceptibility artifact in the region of the basilar tip again noted, see MRA findings below. No abnormal enhancement identified.  Visualized orbit soft tissues are within normal limits.  Mild to minimal paranasal sinus mucosal thickening, similar to the prior exam but more involvement of the right sphenoid sinus.  Also, evidence of a small right maxillary fluid  level.  The mastoids remain clear.  Stable visualized cervical spine.  Normal bone marrow signal. Normal scalp soft tissues.  IMPRESSION: 1.  Stable and normal MRI appearance of the brain.  See MRA findings below. 2.  Mild paranasal sinus inflammatory changes.  MRA HEAD WITHOUT CONTRAST  Technique: Angiographic images of the Circle of Willis were obtained using MRA technique without  intravenous contrast.  Findings:  Stable antegrade flow in the posterior circulation with codominant distal vertebral arteries.  Normal bilateral PICA vessels.  Vertebrobasilar junction and proximal basilar artery are normal.  Distal basilar is patent.  SCA and right PCA origins are stable and within normal limits.  Susceptibility artifact at the left basilar tip related to endovascular coils re-identified.  There is stable flow signal re- identified in the region of the base of the aneurysm (series 5 image 82).  As before, this also involves the left PCA origin.  No other flow signal evident in the region of the embolization coils.  Stable and normal bilateral PCA branches.  Stable antegrade flow in both ICA siphons.  Ophthalmic artery origins are within normal limits.  Posterior communicating arteries are diminutive or  absent.  Normal ICA termini, MCA and ACA origins. Visualized bilateral MCA and ACA branches are stable and within normal limits.  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  This has been stable by MRA since 2012.  Original Report Authenticated By: Harley Hallmark, M.D.   Mr Laqueta Jean Wo Contrast  09/01/2011  *RADIOLOGY REPORT*  Clinical Data:  36 year old female with severe pain behind left eye, headache.  Left upper extremity pain.  History of basilar artery coil embolization.  MRI HEAD WITHOUT AND WITH CONTRAST  Technique: Multiplanar, multiecho pulse sequences of the brain and surrounding structures were obtained according to standard protocol without  and with intravenous contrast.  Contrast: 12mL MULTIHANCE GADOBENATE DIMEGLUMINE 529 MG/ML IV SOLN  Comparison: 04/23/2011 and earlier.  Findings:  No restricted diffusion to suggest acute infarction.  No midline shift, mass effect, evidence of mass lesion, ventriculomegaly, extra-axial collection or acute intracranial hemorrhage.  Cervicomedullary junction and pituitary are within normal limits.  Major intracranial vascular flow voids are preserved. Wallace Cullens and white matter signal is within normal limits throughout the brain.  Susceptibility artifact in the region of the basilar tip again noted, see MRA findings below. No abnormal enhancement identified.  Visualized orbit soft tissues are within normal limits.  Mild to minimal paranasal sinus mucosal thickening, similar to the prior exam but more involvement of the right sphenoid sinus.  Also, evidence of a small right maxillary fluid level.  The mastoids remain clear.  Stable visualized cervical spine.  Normal bone marrow signal. Normal scalp soft tissues.  IMPRESSION: 1.  Stable and normal MRI appearance of the brain.  See MRA findings below. 2.  Mild paranasal sinus inflammatory changes.  MRA HEAD WITHOUT CONTRAST  Technique: Angiographic images of the Circle of Willis were obtained using MRA technique without  intravenous contrast.  Findings:  Stable antegrade flow in the posterior circulation with codominant distal vertebral arteries.  Normal bilateral PICA vessels.  Vertebrobasilar junction and proximal basilar artery are normal.  Distal basilar is patent.  SCA and right PCA origins are stable and within normal limits.  Susceptibility artifact at the left basilar tip related to endovascular coils re-identified.  There is stable flow signal re- identified in the region of the base of the aneurysm (series 5 image 82).  As before, this also involves the left PCA origin.  No other flow signal evident in the region of the embolization coils.  Stable and normal  bilateral PCA branches.  Stable antegrade flow in both ICA siphons.  Ophthalmic artery origins are within normal limits.  Posterior communicating arteries are diminutive or absent.  Normal ICA termini, MCA and ACA origins. Visualized bilateral MCA and ACA branches are stable and within normal limits.  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  This has been stable by MRA since 2012.  Original Report Authenticated By: Harley Hallmark, M.D.        MDM  I spoke with the patient about her test results and CT and MRI results. These were ordered due to the fact she was having similar symptoms previous aneurysm symptoms.  I discussed with the patient that this seems unlikely to be her heart but that further outpatient workup would be needed to further rule this out.  I advised her that the testing here today do not indicate any damage to her heart but that she still could  have a heart related issues.  Patient advised to return here for any worsening in her condition.  I advised her to call her doctor in the morning and to return the heart monitor is so that they can get the readings.  The patient asked if this is [place] she will not walk out of heart attack and advised her that I was unable to answer the question because it is possible that her condition could change.  Patient is also pertinent in the negative d-dimer.  She also has atypical symptoms for cardiac chest pain.  Her symptoms have been ongoing for a month        Carlyle Dolly, PA-C 09/01/11 1950  Carlyle Dolly, PA-C 09/01/11 1951

## 2011-09-01 NOTE — ED Notes (Signed)
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  She states that she feels just like she did then except she is now having chest pain. Chest pain is in her left upper chest and goes through to her back. C/O intermittent chest pressure. This has been ongoing for 1 month but it worsened this AM. C/O having high blood pressure.

## 2011-09-01 NOTE — ED Provider Notes (Signed)
Medical screening examination/treatment/procedure(s) were performed by non-physician practitioner and as supervising physician I was immediately available for consultation/collaboration.   Arleta Ostrum B. Bernette Mayers, MD 09/01/11 1610

## 2011-09-06 ENCOUNTER — Telehealth (HOSPITAL_COMMUNITY): Payer: Self-pay

## 2011-09-06 NOTE — Telephone Encounter (Signed)
I received a message from Dr. Corliss Skains in regards to Kathy Chaney.  He wants to see her in consult due to her last ED visit.  I called the home phone number and spoke to a gentlemen.  Kathy Chaney wasn't home so he took a message to have her give me a call.

## 2011-09-11 ENCOUNTER — Other Ambulatory Visit: Payer: Self-pay | Admitting: Internal Medicine

## 2011-09-13 ENCOUNTER — Other Ambulatory Visit (HOSPITAL_COMMUNITY): Payer: Self-pay | Admitting: Interventional Radiology

## 2011-09-13 DIAGNOSIS — I729 Aneurysm of unspecified site: Secondary | ICD-10-CM

## 2011-09-14 ENCOUNTER — Encounter: Payer: Self-pay | Admitting: Internal Medicine

## 2011-09-14 ENCOUNTER — Ambulatory Visit (INDEPENDENT_AMBULATORY_CARE_PROVIDER_SITE_OTHER)
Admission: RE | Admit: 2011-09-14 | Discharge: 2011-09-14 | Disposition: A | Discharge status: Home / Self Care | Payer: BC Managed Care – PPO | Source: Ambulatory Visit | Attending: Internal Medicine | Admitting: Internal Medicine

## 2011-09-14 ENCOUNTER — Ambulatory Visit (INDEPENDENT_AMBULATORY_CARE_PROVIDER_SITE_OTHER): Payer: BC Managed Care – PPO | Admitting: Internal Medicine

## 2011-09-14 VITALS — BP 118/72 | HR 74 | Temp 98.4°F | Resp 16 | Wt 135.0 lb

## 2011-09-14 DIAGNOSIS — M545 Low back pain, unspecified: Secondary | ICD-10-CM

## 2011-09-14 DIAGNOSIS — I10 Essential (primary) hypertension: Secondary | ICD-10-CM

## 2011-09-14 DIAGNOSIS — R10A3 Flank pain, bilateral: Secondary | ICD-10-CM

## 2011-09-14 DIAGNOSIS — R109 Unspecified abdominal pain: Secondary | ICD-10-CM

## 2011-09-14 LAB — POCT URINALYSIS DIPSTICK
Bilirubin, UA: NEGATIVE
Leukocytes, UA: NEGATIVE
Nitrite, UA: NEGATIVE
Protein, UA: NEGATIVE
pH, UA: 7

## 2011-09-14 MED ORDER — AMLODIPINE BESYLATE 5 MG PO TABS: 5.0000 mg | ORAL_TABLET | Freq: Every day | ORAL | Status: DC

## 2011-09-14 MED ORDER — NEBIVOLOL HCL 5 MG PO TABS: 5.0000 mg | ORAL_TABLET | Freq: Every day | ORAL | Status: DC

## 2011-09-14 NOTE — Assessment & Plan Note (Signed)
She reports that amlodipine causes her to cough so she stopped it and the cough resolved, she will change to bystolic for BP control

## 2011-09-14 NOTE — Assessment & Plan Note (Signed)
I will check a plain film of her back to look for ddd, spurs, etc. She will continue ibuprofen for pain relief.

## 2011-09-14 NOTE — Patient Instructions (Signed)
Back Pain, Adult Low back pain is very common. About 1 in 5 people have back pain.The cause of low back pain is rarely dangerous. The pain often gets better over time.About half of people with a sudden onset of back pain feel better in just 2 weeks. About 8 in 10 people feel better by 6 weeks.  CAUSES Some common causes of back pain include:  Strain of the muscles or ligaments supporting the spine.   Wear and tear (degeneration) of the spinal discs.   Arthritis.   Direct injury to the back.  DIAGNOSIS Most of the time, the direct cause of low back pain is not known.However, back pain can be treated effectively even when the exact cause of the pain is unknown.Answering your caregiver's questions about your overall health and symptoms is one of the most accurate ways to make sure the cause of your pain is not dangerous. If your caregiver needs more information, he or she may order lab work or imaging tests (X-rays or MRIs).However, even if imaging tests show changes in your back, this usually does not require surgery. HOME CARE INSTRUCTIONS For many people, back pain returns.Since low back pain is rarely dangerous, it is often a condition that people can learn to manageon their own.   Remain active. It is stressful on the back to sit or stand in one place. Do not sit, drive, or stand in one place for more than 30 minutes at a time. Take short walks on level surfaces as soon as pain allows.Try to increase the length of time you walk each day.   Do not stay in bed.Resting more than 1 or 2 days can delay your recovery.   Do not avoid exercise or work.Your body is made to move.It is not dangerous to be active, even though your back may hurt.Your back will likely heal faster if you return to being active before your pain is gone.   Pay attention to your body when you bend and lift. Many people have less discomfortwhen lifting if they bend their knees, keep the load close to their  bodies,and avoid twisting. Often, the most comfortable positions are those that put less stress on your recovering back.   Find a comfortable position to sleep. Use a firm mattress and lie on your side with your knees slightly bent. If you lie on your back, put a pillow under your knees.   Only take over-the-counter or prescription medicines as directed by your caregiver. Over-the-counter medicines to reduce pain and inflammation are often the most helpful.Your caregiver may prescribe muscle relaxant drugs.These medicines help dull your pain so you can more quickly return to your normal activities and healthy exercise.   Put ice on the injured area.   Put ice in a plastic bag.   Place a towel between your skin and the bag.   Leave the ice on for 15 to 20 minutes, 3 to 4 times a day for the first 2 to 3 days. After that, ice and heat may be alternated to reduce pain and spasms.   Ask your caregiver about trying back exercises and gentle massage. This may be of some benefit.   Avoid feeling anxious or stressed.Stress increases muscle tension and can worsen back pain.It is important to recognize when you are anxious or stressed and learn ways to manage it.Exercise is a great option.  SEEK MEDICAL CARE IF:  You have pain that is not relieved with rest or medicine.   You have   pain that does not improve in 1 week.   You have new symptoms.   You are generally not feeling well.  SEEK IMMEDIATE MEDICAL CARE IF:   You have pain that radiates from your back into your legs.   You develop new bowel or bladder control problems.   You have unusual weakness or numbness in your arms or legs.   You develop nausea or vomiting.   You develop abdominal pain.   You feel faint.  Document Released: 01/04/2005 Document Revised: 12/24/2010 Document Reviewed: 05/25/2010 [employer] Patient Information 2012 [employer]. 

## 2011-09-14 NOTE — Progress Notes (Signed)
Subjective:    Patient ID: Kathy Chaney, female    DOB: 1975/06/19, 36 y.o.   MRN: [id number]  Back Pain This is a recurrent problem. The current episode started more than 1 year ago. The problem occurs intermittently. The problem has been gradually worsening since onset. The pain is present in the lumbar spine. The quality of the pain is described as aching. The pain does not radiate. The pain is at a severity of 2/10. The pain is mild. The pain is worse during the day. The symptoms are aggravated by standing, bending and twisting. Stiffness is present all day. Pertinent negatives include no abdominal pain, bladder incontinence, bowel incontinence, chest pain, dysuria, fever, headaches, leg pain, numbness, paresis, paresthesias, pelvic pain, perianal numbness, tingling, weakness or weight loss. Risk factors include obesity and lack of exercise. She has tried NSAIDs for the symptoms. The treatment provided moderate relief.  Hypertension This is a chronic problem. The current episode started more than 1 year ago. The problem has been gradually improving since onset. The problem is controlled. Pertinent negatives include no anxiety, blurred vision, chest pain, headaches, malaise/fatigue, neck pain, orthopnea, palpitations, peripheral edema, PND, shortness of breath or sweats. Agents associated with hypertension include NSAIDs. Past treatments include calcium channel blockers. Compliance problems include medication side effects.       Review of Systems  Constitutional: Negative for fever, chills, weight loss, malaise/fatigue, diaphoresis, activity change, appetite change, fatigue and unexpected weight change.  HENT: Negative.  Negative for neck pain.   Eyes: Negative.  Negative for blurred vision.  Respiratory: Negative for cough, chest tightness, shortness of breath, wheezing and stridor.   Cardiovascular: Negative for chest pain, palpitations, orthopnea, leg swelling and PND.  Gastrointestinal:  Negative for nausea, vomiting, abdominal pain, diarrhea, constipation, blood in stool, abdominal distention and bowel incontinence.  Genitourinary: Negative for bladder incontinence, dysuria, urgency, frequency, hematuria, flank pain, decreased urine volume, vaginal bleeding, vaginal discharge, enuresis, difficulty urinating, genital sores, vaginal pain, menstrual problem, pelvic pain and dyspareunia.  Musculoskeletal: Positive for back pain. Negative for myalgias, joint swelling, arthralgias and gait problem.  Skin: Negative for color change, pallor, rash and wound.  Neurological: Negative for tingling, weakness, numbness, headaches and paresthesias.  Hematological: Negative for adenopathy. Does not bruise/bleed easily.  Psychiatric/Behavioral: Negative.        Objective:   Physical Exam  Vitals reviewed. Constitutional: She is oriented to person, place, and time. She appears well-developed and well-nourished. No distress.  HENT:  Head: Normocephalic and atraumatic.  Mouth/Throat: Oropharynx is clear and moist. No oropharyngeal exudate.  Eyes: Conjunctivae are normal. Right eye exhibits no discharge. Left eye exhibits no discharge. No scleral icterus.  Neck: Normal range of motion. Neck supple. No JVD present. No tracheal deviation present. No thyromegaly present.  Cardiovascular: Normal rate, regular rhythm, normal heart sounds and intact distal pulses.  Exam reveals no gallop and no friction rub.   No murmur heard. Pulmonary/Chest: Effort normal and breath sounds normal. No stridor. No respiratory distress. She has no wheezes. She has no rales. She exhibits no tenderness.  Abdominal: Soft. Normal appearance and bowel sounds are normal. She exhibits no shifting dullness, no distension, no pulsatile liver, no fluid wave, no abdominal bruit, no ascites, no pulsatile midline mass and no mass. There is no hepatosplenomegaly, splenomegaly or hepatomegaly. There is no tenderness. There is no  rebound, no guarding and no CVA tenderness. No hernia. Hernia confirmed negative in the ventral area, confirmed negative in the right inguinal area  and confirmed negative in the left inguinal area.  Musculoskeletal: Normal range of motion. She exhibits no edema and no tenderness.       Lumbar back: Normal. She exhibits normal range of motion, no tenderness, no bony tenderness, no swelling, no edema, no deformity, no laceration, no pain, no spasm and normal pulse.  Lymphadenopathy:    She has no cervical adenopathy.  Neurological: She is alert and oriented to person, place, and time. She has normal strength. She displays no atrophy, no tremor and normal reflexes. No cranial nerve deficit or sensory deficit. She exhibits normal muscle tone. She displays a negative Romberg sign. She displays no seizure activity. Coordination and gait normal. She displays no Babinski's sign on the right side. She displays no Babinski's sign on the left side.  Reflex Scores:      Tricep reflexes are 1+ on the right side and 1+ on the left side.      Bicep reflexes are 1+ on the right side and 1+ on the left side.      Brachioradialis reflexes are 1+ on the right side and 1+ on the left side.      Patellar reflexes are 1+ on the right side and 1+ on the left side.      Achilles reflexes are 1+ on the right side and 1+ on the left side.      - SLR in BLE  Skin: Skin is warm and dry. No rash noted. She is not diaphoretic. No erythema. No pallor.  Psychiatric: She has a normal mood and affect. Her behavior is normal. Judgment and thought content normal.      Lab Results  Component Value Date   WBC 6.6 09/01/2011   HGB 13.1 09/01/2011   HCT 36.9 09/01/2011   PLT 252 09/01/2011   GLUCOSE 106* 09/01/2011   CHOL  Value: 129        ATP III CLASSIFICATION:  <200     mg/dL   Desirable  956-213  mg/dL   Borderline High  >=086    mg/dL   High        5/78/4696   TRIG 20 02/05/2010   HDL 47 02/05/2010   LDLCALC  Value: 78         Total Cholesterol/HDL:CHD Risk Coronary Heart Disease Risk Table                     Men   Women  1/2 Average Risk   3.4   3.3  Average Risk       5.0   4.4  2 X Average Risk   9.6   7.1  3 X Average Risk  23.4   11.0        Use the calculated Patient Ratio above and the CHD Risk Table to determine the patient's CHD Risk.        ATP III CLASSIFICATION (LDL):  <100     mg/dL   Optimal  295-284  mg/dL   Near or Above                    Optimal  130-159  mg/dL   Borderline  132-440  mg/dL   High  >102     mg/dL   Very High 08/12/3662   ALT 13 09/01/2011   AST 18 09/01/2011   NA 143 09/01/2011   K 3.9 09/01/2011   CL 105 09/01/2011   CREATININE 0.63 09/01/2011   BUN 7  09/01/2011   CO2 29 09/01/2011   TSH 0.295* 02/04/2010   INR 0.96 11/06/2010      Assessment & Plan:

## 2011-09-14 NOTE — Assessment & Plan Note (Signed)
Her UA is normal, I do not think the pain is coming from her kidneys but is related to her low back pain

## 2011-09-15 ENCOUNTER — Telehealth: Payer: Self-pay | Admitting: Internal Medicine

## 2011-09-15 MED ORDER — TRAMADOL HCL 50 MG PO TABS: 50.0000 mg | ORAL_TABLET | Freq: Three times a day (TID) | ORAL | Status: AC | PRN

## 2011-09-15 NOTE — Addendum Note (Signed)
Addended by: Etta Grandchild on: 09/15/2011 04:35 PM   Modules accepted: Orders

## 2011-09-15 NOTE — Telephone Encounter (Signed)
Pt has decided she would like stronger pain medicine for her arthritis that was offered at her last appointment.  She has an appt in mid Sept.

## 2011-09-15 NOTE — Telephone Encounter (Signed)
done

## 2011-09-16 NOTE — Telephone Encounter (Signed)
Pt aware to call her pharmacy.

## 2011-09-17 ENCOUNTER — Ambulatory Visit (HOSPITAL_COMMUNITY)
Admission: RE | Admit: 2011-09-17 | Discharge: 2011-09-17 | Disposition: A | Discharge status: Home / Self Care | Payer: BC Managed Care – PPO | Source: Ambulatory Visit | Attending: Interventional Radiology | Admitting: Interventional Radiology

## 2011-09-17 ENCOUNTER — Encounter: Payer: Self-pay | Admitting: Physician Assistant

## 2011-09-17 DIAGNOSIS — I729 Aneurysm of unspecified site: Secondary | ICD-10-CM

## 2011-10-05 ENCOUNTER — Ambulatory Visit: Payer: BC Managed Care – PPO | Admitting: Internal Medicine

## 2011-10-05 DIAGNOSIS — Z0289 Encounter for other administrative examinations: Secondary | ICD-10-CM

## 2011-12-30 ENCOUNTER — Other Ambulatory Visit (INDEPENDENT_AMBULATORY_CARE_PROVIDER_SITE_OTHER): Payer: BC Managed Care – PPO

## 2011-12-30 ENCOUNTER — Other Ambulatory Visit: Payer: Self-pay | Admitting: Internal Medicine

## 2011-12-30 ENCOUNTER — Encounter: Payer: Self-pay | Admitting: Internal Medicine

## 2011-12-30 ENCOUNTER — Ambulatory Visit (INDEPENDENT_AMBULATORY_CARE_PROVIDER_SITE_OTHER): Payer: BC Managed Care – PPO | Admitting: Internal Medicine

## 2011-12-30 VITALS — BP 130/80 | HR 94 | Temp 98.5°F | Resp 16

## 2011-12-30 DIAGNOSIS — N951 Menopausal and female climacteric states: Secondary | ICD-10-CM

## 2011-12-30 DIAGNOSIS — I10 Essential (primary) hypertension: Secondary | ICD-10-CM

## 2011-12-30 DIAGNOSIS — R079 Chest pain, unspecified: Secondary | ICD-10-CM | POA: Insufficient documentation

## 2011-12-30 DIAGNOSIS — R232 Flushing: Secondary | ICD-10-CM

## 2011-12-30 DIAGNOSIS — E0590 Thyrotoxicosis, unspecified without thyrotoxic crisis or storm: Secondary | ICD-10-CM

## 2011-12-30 DIAGNOSIS — Z23 Encounter for immunization: Secondary | ICD-10-CM

## 2011-12-30 LAB — CBC WITH DIFFERENTIAL/PLATELET
Basophils Relative: 0.3 % (ref 0.0–3.0)
Eosinophils Relative: 1.4 % (ref 0.0–5.0)
HCT: 40.3 % (ref 36.0–46.0)
Lymphs Abs: 1.5 10*3/uL (ref 0.7–4.0)
MCV: 92.5 fl (ref 78.0–100.0)
Monocytes Absolute: 0.4 10*3/uL (ref 0.1–1.0)
RBC: 4.35 Mil/uL (ref 3.87–5.11)
WBC: 6.2 10*3/uL (ref 4.5–10.5)

## 2011-12-30 LAB — URINALYSIS, ROUTINE W REFLEX MICROSCOPIC
Ketones, ur: NEGATIVE
Specific Gravity, Urine: 1.015 (ref 1.000–1.030)
Urine Glucose: NEGATIVE
pH: 6 (ref 5.0–8.0)

## 2011-12-30 LAB — FOLLICLE STIMULATING HORMONE: FSH: 8.7 m[IU]/mL

## 2011-12-30 LAB — TROPONIN I: Troponin I: 0.01 ng/mL (ref <0.06)

## 2011-12-30 LAB — COMPREHENSIVE METABOLIC PANEL
Albumin: 4.5 g/dL (ref 3.5–5.2)
Alkaline Phosphatase: 61 U/L (ref 39–117)
BUN: 8 mg/dL (ref 6–23)
Glucose, Bld: 93 mg/dL (ref 70–99)
Potassium: 3.6 mEq/L (ref 3.5–5.1)
Total Bilirubin: 0.5 mg/dL (ref 0.3–1.2)

## 2011-12-30 LAB — TSH: TSH: 0.68 u[IU]/mL (ref 0.35–5.50)

## 2011-12-30 LAB — CARDIAC PANEL: Total CK: 180 U/L — ABNORMAL HIGH (ref 7–177)

## 2011-12-30 LAB — LUTEINIZING HORMONE: LH: 9.66 m[IU]/mL

## 2011-12-30 MED ORDER — HYDROCHLOROTHIAZIDE 12.5 MG PO CAPS: 12.5000 mg | ORAL_CAPSULE | Freq: Every day | ORAL | Status: DC

## 2011-12-30 NOTE — Assessment & Plan Note (Signed)
I will check her for thyroid disease, early menopause, anemia, abnormal lytes, etc.

## 2011-12-30 NOTE — Assessment & Plan Note (Signed)
Her pain is atypical The EKG is normal I will check her labs to look for ischemia, PE, anemia, etc

## 2011-12-30 NOTE — Assessment & Plan Note (Signed)
She stopped bystolic due to headaches, will switch her to [facility] for BP control

## 2011-12-30 NOTE — Patient Instructions (Signed)
Chest Pain (Nonspecific) It is often hard to give a specific diagnosis for the cause of chest pain. There is always a chance that your pain could be related to something serious, such as a heart attack or a blood clot in the lungs. You need to follow up with your caregiver for further evaluation. CAUSES   Heartburn.  Pneumonia or bronchitis.  Anxiety or stress.  Inflammation around your heart (pericarditis) or lung (pleuritis or pleurisy).  A blood clot in the lung.  A collapsed lung (pneumothorax). It can develop suddenly on its own (spontaneous pneumothorax) or from injury (trauma) to the chest.  Shingles infection (herpes zoster virus). The chest wall is composed of bones, muscles, and cartilage. Any of these can be the source of the pain.  The bones can be bruised by injury.  The muscles or cartilage can be strained by coughing or overwork.  The cartilage can be affected by inflammation and become sore (costochondritis). DIAGNOSIS  Lab tests or other studies, such as X-rays, electrocardiography, stress testing, or cardiac imaging, may be needed to find the cause of your pain.  TREATMENT   Treatment depends on what may be causing your chest pain. Treatment may include:  Acid blockers for heartburn.  Anti-inflammatory medicine.  Pain medicine for inflammatory conditions.  Antibiotics if an infection is present.  You may be advised to change lifestyle habits. This includes stopping smoking and avoiding alcohol, caffeine, and chocolate.  You may be advised to keep your head raised (elevated) when sleeping. This reduces the chance of acid going backward from your stomach into your esophagus.  Most of the time, nonspecific chest pain will improve within 2 to 3 days with rest and mild pain medicine. HOME CARE INSTRUCTIONS   If antibiotics were prescribed, take your antibiotics as directed. Finish them even if you start to feel better.  For the next few days, avoid physical  activities that bring on chest pain. Continue physical activities as directed.  Do not smoke.  Avoid drinking alcohol.  Only take over-the-counter or prescription medicine for pain, discomfort, or fever as directed by your caregiver.  Follow your caregiver's suggestions for further testing if your chest pain does not go away.  Keep any follow-up appointments you made. If you do not go to an appointment, you could develop lasting (chronic) problems with pain. If there is any problem keeping an appointment, you must call to reschedule. SEEK MEDICAL CARE IF:   You think you are having problems from the medicine you are taking. Read your medicine instructions carefully.  Your chest pain does not go away, even after treatment.  You develop a rash with blisters on your chest. SEEK IMMEDIATE MEDICAL CARE IF:   You have increased chest pain or pain that spreads to your arm, neck, jaw, back, or abdomen.  You develop shortness of breath, an increasing cough, or you are coughing up blood.  You have severe back or abdominal pain, feel nauseous, or vomit.  You develop severe weakness, fainting, or chills.  You have a fever. THIS IS AN EMERGENCY. Do not wait to see if the pain will go away. Get medical help at once. Call your local emergency services (911 in [place] Do not drive yourself to the hospital. MAKE SURE YOU:   Understand these instructions.  Will watch your condition.  Will get help right away if you are not doing well or get worse. Document Released: 10/14/2004 Document Revised: 03/29/2011 Document Reviewed: 08/10/2007 [employer] Patient Information 2013 [employer].  

## 2011-12-30 NOTE — Progress Notes (Signed)
Subjective:    Patient ID: Kathy Chaney, female    DOB: July 21, 1975, 36 y.o.   MRN: [id number]  Chest Pain  This is a new problem. The current episode started 1 to 4 weeks ago. The onset quality is gradual. The problem occurs intermittently. The problem has been unchanged. The pain is present in the substernal region. The pain is at a severity of 2/10. The pain is mild. The quality of the pain is described as sharp and stabbing. The pain does not radiate. Associated symptoms include diaphoresis ('hot flashes") and malaise/fatigue. Pertinent negatives include no abdominal pain, back pain, claudication, cough, dizziness, exertional chest pressure, fever, headaches, hemoptysis, irregular heartbeat, leg pain, lower extremity edema, nausea, near-syncope, numbness, orthopnea, palpitations, PND, shortness of breath, sputum production, syncope, vomiting or weakness.  Pertinent negatives for past medical history include no seizures.      Review of Systems  Constitutional: Positive for malaise/fatigue and diaphoresis ('hot flashes"). Negative for fever, chills, activity change, appetite change, fatigue and unexpected weight change.  HENT: Negative.   Eyes: Negative.   Respiratory: Negative for apnea, cough, hemoptysis, sputum production, choking, chest tightness, shortness of breath, wheezing and stridor.   Cardiovascular: Positive for chest pain. Negative for palpitations, orthopnea, claudication, leg swelling, syncope, PND and near-syncope.  Gastrointestinal: Negative for nausea, vomiting, abdominal pain, diarrhea and constipation.  Genitourinary: Negative.   Musculoskeletal: Negative for myalgias, back pain, joint swelling, arthralgias and gait problem.  Skin: Negative for color change, pallor, rash and wound.  Neurological: Negative for dizziness, tremors, seizures, syncope, facial asymmetry, speech difficulty, weakness, light-headedness, numbness and headaches.  Hematological: Negative for  adenopathy. Does not bruise/bleed easily.  Psychiatric/Behavioral: Negative.        Objective:   Physical Exam  Vitals reviewed. Constitutional: She is oriented to person, place, and time. She appears well-developed and well-nourished. No distress.  HENT:  Head: Normocephalic and atraumatic.  Mouth/Throat: Oropharynx is clear and moist. No oropharyngeal exudate.  Eyes: Conjunctivae normal are normal. Right eye exhibits no discharge. Left eye exhibits no discharge. No scleral icterus.  Neck: Normal range of motion. Neck supple. No JVD present. No tracheal deviation present. No thyromegaly present.  Cardiovascular: Normal rate, regular rhythm, normal heart sounds and intact distal pulses.  Exam reveals no gallop and no friction rub.   No murmur heard. Pulmonary/Chest: Effort normal and breath sounds normal. No stridor. No respiratory distress. She has no wheezes. She has no rales. She exhibits no tenderness.  Abdominal: Soft. Bowel sounds are normal. She exhibits no distension and no mass. There is no tenderness. There is no rebound and no guarding.  Musculoskeletal: Normal range of motion. She exhibits no edema and no tenderness.  Lymphadenopathy:    She has no cervical adenopathy.  Neurological: She is oriented to person, place, and time.  Skin: Skin is warm and dry. No rash noted. She is not diaphoretic. No erythema. No pallor.  Psychiatric: She has a normal mood and affect. Her behavior is normal. Judgment and thought content normal.     Lab Results  Component Value Date   WBC 6.6 09/01/2011   HGB 13.1 09/01/2011   HCT 36.9 09/01/2011   PLT 252 09/01/2011   GLUCOSE 106* 09/01/2011   CHOL  Value: 129        ATP III CLASSIFICATION:  <200     mg/dL   Desirable  409-811  mg/dL   Borderline High  >=914    mg/dL   High  02/05/2010   TRIG 20 02/05/2010   HDL 47 02/05/2010   LDLCALC  Value: 78        Total Cholesterol/HDL:CHD Risk Coronary Heart Disease Risk Table                     Men    Women  1/2 Average Risk   3.4   3.3  Average Risk       5.0   4.4  2 X Average Risk   9.6   7.1  3 X Average Risk  23.4   11.0        Use the calculated Patient Ratio above and the CHD Risk Table to determine the patient's CHD Risk.        ATP III CLASSIFICATION (LDL):  <100     mg/dL   Optimal  086-578  mg/dL   Near or Above                    Optimal  130-159  mg/dL   Borderline  469-629  mg/dL   High  >528     mg/dL   Very High 05/01/2438   ALT 13 09/01/2011   AST 18 09/01/2011   NA 143 09/01/2011   K 3.9 09/01/2011   CL 105 09/01/2011   CREATININE 0.63 09/01/2011   BUN 7 09/01/2011   CO2 29 09/01/2011   TSH 0.295* 02/04/2010   INR 0.96 11/06/2010       Assessment & Plan:

## 2011-12-30 NOTE — Assessment & Plan Note (Signed)
She appears to be euthyroid I will check her TFT's today

## 2012-01-04 ENCOUNTER — Telehealth: Payer: Self-pay | Admitting: *Deleted

## 2012-01-04 NOTE — Telephone Encounter (Signed)
PATIENT REQEUST TEST RESULTS THAT WERE DONE  12/20/2011/   CALL BACK # CELL # [phone]

## 2012-01-04 NOTE — Telephone Encounter (Signed)
Spoke with pt and notified per letter of results. Pt c/o continued nausea and pain and request to be seen sooner that 2wks, appt rescheduled

## 2012-01-07 ENCOUNTER — Ambulatory Visit: Payer: BC Managed Care – PPO | Admitting: Internal Medicine

## 2012-01-07 DIAGNOSIS — Z0289 Encounter for other administrative examinations: Secondary | ICD-10-CM

## 2012-01-17 ENCOUNTER — Ambulatory Visit: Payer: BC Managed Care – PPO | Admitting: Internal Medicine

## 2012-02-20 ENCOUNTER — Encounter (HOSPITAL_COMMUNITY): Payer: Self-pay | Admitting: Emergency Medicine

## 2012-02-20 ENCOUNTER — Emergency Department (HOSPITAL_COMMUNITY): Payer: BC Managed Care – PPO

## 2012-02-20 ENCOUNTER — Emergency Department (HOSPITAL_COMMUNITY)
Admission: EM | Admit: 2012-02-20 | Discharge: 2012-02-20 | Disposition: A | Discharge status: Home / Self Care | Payer: BC Managed Care – PPO | Attending: Emergency Medicine | Admitting: Emergency Medicine

## 2012-02-20 DIAGNOSIS — Z3202 Encounter for pregnancy test, result negative: Secondary | ICD-10-CM | POA: Insufficient documentation

## 2012-02-20 DIAGNOSIS — M545 Low back pain, unspecified: Secondary | ICD-10-CM | POA: Insufficient documentation

## 2012-02-20 DIAGNOSIS — M79609 Pain in unspecified limb: Secondary | ICD-10-CM

## 2012-02-20 DIAGNOSIS — Z8679 Personal history of other diseases of the circulatory system: Secondary | ICD-10-CM | POA: Insufficient documentation

## 2012-02-20 DIAGNOSIS — I10 Essential (primary) hypertension: Secondary | ICD-10-CM | POA: Insufficient documentation

## 2012-02-20 DIAGNOSIS — R1032 Left lower quadrant pain: Secondary | ICD-10-CM

## 2012-02-20 DIAGNOSIS — R109 Unspecified abdominal pain: Secondary | ICD-10-CM | POA: Insufficient documentation

## 2012-02-20 DIAGNOSIS — Z79899 Other long term (current) drug therapy: Secondary | ICD-10-CM | POA: Insufficient documentation

## 2012-02-20 DIAGNOSIS — F17200 Nicotine dependence, unspecified, uncomplicated: Secondary | ICD-10-CM | POA: Insufficient documentation

## 2012-02-20 LAB — URINALYSIS, ROUTINE W REFLEX MICROSCOPIC
Bilirubin Urine: NEGATIVE
Ketones, ur: NEGATIVE mg/dL
Leukocytes, UA: NEGATIVE
Nitrite: NEGATIVE
Urobilinogen, UA: 0.2 mg/dL (ref 0.0–1.0)
pH: 6.5 (ref 5.0–8.0)

## 2012-02-20 MED ORDER — OXYCODONE-ACETAMINOPHEN 5-325 MG PO TABS: ORAL_TABLET | ORAL | Status: DC

## 2012-02-20 MED ORDER — OXYCODONE-ACETAMINOPHEN 5-325 MG PO TABS
2.0000 | ORAL_TABLET | Freq: Once | ORAL | Status: AC
  Administered 2012-02-20: 2 via ORAL
  Filled 2012-02-20: qty 2

## 2012-02-20 NOTE — ED Provider Notes (Signed)
History   This chart was scribed for Wynetta Emery PA-C, a non-physician practitioner working with Doug Sou, MD by Lewanda Rife, ED Scribe. This patient was seen in room TR09C/TR09C and the patient's care was started at 5:46pm.   CSN: [id number]  Arrival date & time 02/20/12  1728   First MD Initiated Contact with Patient 02/20/12 1735      Chief Complaint  Patient presents with  . Groin Pain    left side    (Consider location/radiation/quality/duration/timing/severity/associated sxs/prior treatment) HPI Kathy Chaney is a 37 y.o. female who presents to the Emergency Department complaining of waxing and waning moderate left sided groin pain acute onset today. Pt reports she was laying down with daughter when she felt a sharp pain radiating down left leg, left groin, and lower back. Pt denies recent injury or fall. Pt reports pain is not exacerbated by position or by movement.  Pt reports history of brain aneurysm in 01/2010.  LMP 01/22/2012.    Past Medical History  Diagnosis Date  . Hypertension   . Headache   . Low back pain   . 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 Tubal ligation   . Aneurysm coiling 01/2010    basilar artery  . Hernia repair     Family History  Problem Relation Age of Onset  . Adopted: Yes  . Alcohol abuse Neg Hx   . Cancer Neg Hx   . Early death Neg Hx   . Heart disease Neg Hx   . Hyperlipidemia Neg Hx   . Hypertension Neg Hx   . Stroke Neg Hx     History  Substance Use Topics  . Smoking status: Current Every Day Smoker -- 0.5 packs/day for 15 years    Types: Cigarettes  . Smokeless tobacco: Not on file  . Alcohol Use: Yes     Comment: social    OB History    Grav Para Term Preterm Abortions TAB SAB Ect Mult Living                  Review of Systems  Constitutional: Negative.   HENT: Negative.   Respiratory: Negative.   Cardiovascular: Negative.   Gastrointestinal:  Positive for abdominal pain (groin pain ).  Musculoskeletal: Positive for back pain.  Skin: Negative.   Neurological: Negative.   Hematological: Negative.   Psychiatric/Behavioral: Negative.   All other systems reviewed and are negative.   A complete 10 system review of systems was obtained and all systems are negative except as noted in the HPI and PMH.    Allergies  Amlodipine  Home Medications   Current Outpatient Rx  Name  Route  Sig  Dispense  Refill  . HYDROCHLOROTHIAZIDE 12.5 MG PO CAPS   Oral   Take 1 capsule (12.5 mg total) by mouth daily.   90 capsule   1     BP 143/87  Pulse 81  Temp 98.9 F (37.2 C) (Oral)  Resp 18  SpO2 99%  LMP 01/22/2012  Physical Exam  Nursing note and vitals reviewed. Constitutional: She is oriented to person, place, and time. She appears well-developed and well-nourished. No distress.  HENT:  Head: Normocephalic.  Mouth/Throat: Oropharynx is clear and moist.  Eyes: Conjunctivae normal and EOM are normal. Pupils are equal, round, and reactive to light.  Cardiovascular: Normal rate.   Pulmonary/Chest: Effort normal and breath sounds normal. No stridor.  Abdominal: Soft. Bowel sounds are normal. She exhibits no distension and no mass. There is no tenderness. There is no rebound and no guarding.       No tenderness to palpation or rebound.   No inguinal hernias, femoral pulses 2+ bilaterally, dorsalis pedis 2+ bilaterally full range of motion to left hip joint.  Musculoskeletal: Normal range of motion.       No CVA tenderness bilaterally, no tenderness to palpation of the paraspinal musculature of the lumbar area  Neurological: She is alert and oriented to person, place, and time.  Skin: Skin is warm. No rash noted.  Psychiatric: She has a normal mood and affect.    ED Course  Procedures (including critical care time) 6:00 PM Pt refused pain medication when offered.     Labs Reviewed  URINALYSIS, ROUTINE W REFLEX MICROSCOPIC   PREGNANCY, URINE   US Aorta  02/20/2012  *RADIOLOGY REPORT*  Clinical Data:  Left-sided abdominal pain.  Groin pain.  ULTRASOUND OF ABDOMINAL AORTA  Technique:  Ultrasound examination of the abdominal aorta was performed to evaluate for abdominal aortic aneurysm.  Comparison: None.  Abdominal Aorta:  No aneurysm identified.        Maximum AP diameter:  2.1 cm.       Maximum TRV diameter:  1.8 cm.  Common iliacs measure 7 mm on the right and 9 mm on the left.  IMPRESSION: No abdominal aortic aneurysm identified.   Original Report Authenticated By: Andreas Newport, M.D.      1. Left groin pain   2. Low back pain       MDM  Patient with intermittent severe left groin and low back pain onset earlier in the day. She has a history of intracerebral aneurysm. Physical exam shows no abnormalities. This is a shared visit with attending Dr. Ethelda Chick.   Aortic ultrasound shows no abnormalities. Urinalysis is normal. Patient is not pregnant. Vascular study shows no pseudoaneurysm.   Doubt any acute processes at this time. Vital signs are stable. Patient is amenable to discharge. She has excellent outpatient followup.  Filed Vitals:   02/20/12 1731  BP: 143/87  Pulse: 81  Temp: 98.9 F (37.2 C)  TempSrc: Oral  Resp: 18  SpO2: 99%     Pt verbalized understanding and agrees with care plan. Outpatient follow-up and return precautions given.    New Prescriptions   OXYCODONE-ACETAMINOPHEN (PERCOCET/ROXICET) 5-325 MG PER TABLET    1 to 2 tabs PO q6hrs  PRN for pain   I personally performed the services described in this documentation, which was scribed in my presence. The recorded information has been reviewed and is accurate.      Wynetta Emery, PA-C 02/20/12 2052

## 2012-02-20 NOTE — ED Provider Notes (Signed)
Complains of low back pain radiating to left groin and to left thigh onset this afternoon. While at rest. Pain is intermittent not exacerbated or ameliorated by anything she is presently pain free on exam alert no distress Glasgow Coma Score 15 entire spine is nontender no flank tenderness all 4 extremities neurovascularly intact. Ultrasound ordered to evaluate aorta as patient has history of cerebral aneurysm  Doug Sou, MD 02/20/12 1308

## 2012-02-20 NOTE — Progress Notes (Signed)
VASCULAR LAB PRELIMINARY  PRELIMINARY  PRELIMINARY  PRELIMINARY  Left groin ultrasound completed.    Preliminary report:  There is no pseudoaneurysm, AV fistula, DVT or arterial stenosis noted in the left groin.  Herberth Deharo, RVT 02/20/2012, 7:33 PM

## 2012-02-20 NOTE — ED Notes (Signed)
Pt c/o left side groin pain radiating down left leg. Pt reports was playing with her daughter when pain began.

## 2012-02-21 NOTE — ED Provider Notes (Signed)
Medical screening examination/treatment/procedure(s) were conducted as a shared visit with non-physician practitioner(s) and myself.  I personally evaluated the patient during the encounter  Doug Sou, MD 02/21/12 [phone]

## 2012-04-04 ENCOUNTER — Emergency Department (HOSPITAL_COMMUNITY)
Admission: EM | Admit: 2012-04-04 | Discharge: 2012-04-05 | Disposition: A | Discharge status: Home / Self Care | Payer: BC Managed Care – PPO | Attending: Emergency Medicine | Admitting: Emergency Medicine

## 2012-04-04 ENCOUNTER — Telehealth: Payer: Self-pay | Admitting: Internal Medicine

## 2012-04-04 DIAGNOSIS — F17200 Nicotine dependence, unspecified, uncomplicated: Secondary | ICD-10-CM | POA: Insufficient documentation

## 2012-04-04 DIAGNOSIS — I10 Essential (primary) hypertension: Secondary | ICD-10-CM | POA: Insufficient documentation

## 2012-04-04 DIAGNOSIS — Z8669 Personal history of other diseases of the nervous system and sense organs: Secondary | ICD-10-CM | POA: Insufficient documentation

## 2012-04-04 DIAGNOSIS — R51 Headache: Secondary | ICD-10-CM | POA: Insufficient documentation

## 2012-04-04 DIAGNOSIS — R0789 Other chest pain: Secondary | ICD-10-CM | POA: Insufficient documentation

## 2012-04-04 NOTE — Telephone Encounter (Signed)
Patient Information:  Caller Name: Kymiah  Phone: [phone]  Patient: Kathy Chaney, Kathy Chaney  Gender: Female  DOB: Apr 20, 1975  Age: 37 Years  PCP: Sanda Linger (Adults only)  Pregnant: No  Office Follow Up:  Does the office need to follow up with this patient?: No  Instructions For The Office: N/A  RN Note:  Patient states she will not call 911 but will have someone take her to the ED for evaluation.  Symptoms  Reason For Call & Symptoms: Patient states is has had a headache and chest pain, neck and shoulder pain.  Reviewed Health History In EMR: Yes  Reviewed Medications In EMR: Yes  Reviewed Allergies In EMR: Yes  Reviewed Surgeries / Procedures: Yes  Date of Onset of Symptoms: 03/29/2012 OB / GYN:  LMP: 03/23/2012  Guideline(s) Used:  Chest Pain  Disposition Per Guideline:   Call EMS 911 Now  Reason For Disposition Reached:   Chest pain lasting longer than 5 minutes and ANY of the following:  Over 66 years old Over 18 years old and at least one cardiac risk factor (i.e., high blood pressure, diabetes, high cholesterol, obesity, smoker or strong family history of heart disease) Pain is crushing, pressure-like, or heavy  Took nitroglycerin and chest pain was not relieved History of heart disease (i.e., angina, heart attack, bypass surgery, angioplasty, CHF)  Advice Given:  N/A  Patient Refused Recommendation:  Patient Will Go To ED  Patient states she will not call 911 but will have someone take her to the ED for evaluation.

## 2012-04-05 ENCOUNTER — Emergency Department (HOSPITAL_COMMUNITY): Payer: BC Managed Care – PPO

## 2012-04-05 ENCOUNTER — Encounter (HOSPITAL_COMMUNITY): Payer: Self-pay | Admitting: *Deleted

## 2012-04-05 LAB — BASIC METABOLIC PANEL
BUN: 14 mg/dL (ref 6–23)
Calcium: 9.1 mg/dL (ref 8.4–10.5)
Creatinine, Ser: 0.58 mg/dL (ref 0.50–1.10)
GFR calc Af Amer: >90 mL/min (ref >90)
GFR calc non Af Amer: >90 mL/min (ref >90)
Glucose, Bld: 131 mg/dL — ABNORMAL HIGH (ref 70–99)
Potassium: 3.5 mEq/L (ref 3.5–5.1)

## 2012-04-05 LAB — CBC
Hemoglobin: 12.9 g/dL (ref 12.0–15.0)
MCH: 31.5 pg (ref 26.0–34.0)
MCHC: 35.4 g/dL (ref 30.0–36.0)
Platelets: 268 10*3/uL (ref 150–400)
RDW: 13 % (ref 11.5–15.5)

## 2012-04-05 LAB — POCT I-STAT TROPONIN I: Troponin i, poc: 0 ng/mL (ref 0.00–0.08)

## 2012-04-05 MED ORDER — NITROGLYCERIN 0.4 MG SL SUBL: 0.4000 mg | SUBLINGUAL_TABLET | SUBLINGUAL | Status: DC | PRN

## 2012-04-05 MED ORDER — GI COCKTAIL ~~LOC~~
30.0000 mL | Freq: Once | ORAL | Status: AC
  Administered 2012-04-05: 30 mL via ORAL
  Filled 2012-04-05: qty 30

## 2012-04-05 NOTE — ED Provider Notes (Signed)
History     CSN: [id number]  Arrival date & time 04/04/12  2352   First MD Initiated Contact with Patient 04/05/12 0131      Chief Complaint  Patient presents with  . Chest Pain   HPI  History provided by the patient. Patient is a 37 year old female with history of hypertension and brain aneurysm with coiling in 2012 who presents with multiple complaints. Patient reports having one week of left-sided chest pain and discomfort. Pain is somewhat sharp in nature and radiates to left shoulder region. He has been persistent without much change. There is occasionally slight waxing and waning. Pain is not necessarily pruritic in nature and sometimes changes with movements. He denies any injury or trauma. Denies any cough or hemoptysis. No recent fever, chills or sweats. No nasal congestion. Patient has significant complaints of some headache symptoms. She has had occasional headaches following her coiling procedure in 2012. The headache is similar with left-sided headache. Today however she has some pain behind the left eye area which is slightly meal. Pain is described as a 6/10. Patient took some ibuprofen with mild to moderate relief. No other aggravating or alleviating factors. There is no photophobia. No confusion. No vision loss. No red eye. No weakness or numbness in extremities. No nausea vomiting. Diaphoresis.    Past Medical History  Diagnosis Date  . Hypertension   . Headache   . Low back pain   . 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 Tubal ligation    . Aneurysm coiling  01/2010    basilar artery  . Hernia repair      Family History  Problem Relation Age of Onset  . Adopted: Yes  . Alcohol abuse Neg Hx   . Cancer Neg Hx   . Early death Neg Hx   . Heart disease Neg Hx   . Hyperlipidemia Neg Hx   . Hypertension Neg Hx   . Stroke Neg Hx     History  Substance Use Topics  . Smoking status: Current Every Day  Smoker -- 0.50 packs/day for 15 years    Types: Cigarettes  . Smokeless tobacco: Not on file  . Alcohol Use: Yes     Comment: social    OB History   Grav Para Term Preterm Abortions TAB SAB Ect Mult Living                  Review of Systems  Constitutional: Negative for fever and chills.  Eyes: Positive for pain. Negative for redness and visual disturbance.  Respiratory: Negative for cough and shortness of breath.   Cardiovascular: Positive for chest pain. Negative for palpitations and leg swelling.  Gastrointestinal: Negative for nausea, vomiting and abdominal pain.  Genitourinary: Negative for dysuria.  Neurological: Positive for headaches. Negative for weakness and numbness.  All other systems reviewed and are negative.    Allergies  Amlodipine  Home Medications   Current Outpatient Rx  Name  Route  Sig  Dispense  Refill  . hydrochlorothiazide (MICROZIDE) 12.5 MG capsule   Oral   Take 1 capsule (12.5 mg total) by mouth daily.   90 capsule   1     BP 122/84  Pulse 83  Temp(Src) 98.2 F (36.8 C) (Oral)  Resp 17  SpO2 98%  LMP 03/23/2012  Physical Exam  Nursing note and vitals reviewed. Constitutional: She is oriented to person, place, and time.  She appears well-developed and well-nourished. No distress.  HENT:  Head: Normocephalic and atraumatic.  Eyes: Conjunctivae and EOM are normal. Pupils are equal, round, and reactive to light.  Neck: Normal range of motion. Neck supple.  No meningeal signs  Cardiovascular: Normal rate and regular rhythm.   No murmur heard. Pulmonary/Chest: Effort normal and breath sounds normal. No respiratory distress. She has no wheezes. She has no rales. She exhibits no tenderness.  Abdominal: Soft. There is no tenderness. There is no rigidity, no rebound, no guarding, no CVA tenderness and no tenderness at McBurney's point.  Neurological: She is alert and oriented to person, place, and time. She has normal strength. No cranial  nerve deficit or sensory deficit. Gait normal.  Skin: Skin is warm and dry. No rash noted.  Psychiatric: She has a normal mood and affect. Her behavior is normal.    ED Course  Procedures  Results for orders placed during the hospital encounter of 04/04/12  BASIC METABOLIC PANEL      Result Value Range   Sodium 139  135 - 145 mEq/L   Potassium 3.5  3.5 - 5.1 mEq/L   Chloride 104  96 - 112 mEq/L   CO2 25  19 - 32 mEq/L   Glucose, Bld 131 (*) 70 - 99 mg/dL   BUN 14  6 - 23 mg/dL   Creatinine, Ser 0.98  0.50 - 1.10 mg/dL   Calcium 9.1  8.4 - 11.9 mg/dL   GFR calc non Af Amer >90  >90 mL/min   GFR calc Af Amer >90  >90 mL/min  CBC      Result Value Range   WBC 9.1  4.0 - 10.5 K/uL   RBC 4.09  3.87 - 5.11 MIL/uL   Hemoglobin 12.9  12.0 - 15.0 g/dL   HCT 14.7  82.9 - 56.2 %   MCV 89.0  78.0 - 100.0 fL   MCH 31.5  26.0 - 34.0 pg   MCHC 35.4  30.0 - 36.0 g/dL   RDW 13.0  86.5 - 78.4 %   Platelets 268  150 - 400 K/uL  POCT I-STAT TROPONIN I      Result Value Range   Troponin i, poc 0.00  0.00 - 0.08 ng/mL   Comment 3           POCT I-STAT TROPONIN I      Result Value Range   Troponin i, poc 0.00  0.00 - 0.08 ng/mL   Comment 3                Dg Chest 2 View  04/05/2012  *RADIOLOGY REPORT*  Clinical Data: Left chest pain.  CHEST - 2 VIEW  Comparison: 09/01/2011  Findings: The heart size and pulmonary vascularity are normal. The lungs appear clear and expanded without focal air space disease or consolidation. No blunting of the costophrenic angles.  No pneumothorax.  Mediastinal contours appear intact.  No significant change since previous study.  IMPRESSION: No evidence of active pulmonary disease.   Original Report Authenticated By: Burman Nieves, M.D.    Ct Head Wo Contrast  04/05/2012  *RADIOLOGY REPORT*  Clinical Data: Left sided headache and eye pain.  CT HEAD WITHOUT CONTRAST  Technique:  Contiguous axial images were obtained from the base of the skull through the vertex  without contrast.  Comparison: 09/01/2011  Findings: Metallic streak artifact arises from coils in the suprasellar region, similar to previous study.  No mass effect or midline shift.  No abnormal extra-axial fluid collections.  Wallace Cullens- white matter junctions are distinct.  Basal cisterns are not effaced.  Ventricles are not dilated.  No evidence of acute intracranial hemorrhage.  No depressed skull fractures.  Visualized paranasal sinuses and mastoid air cells are not opacified.  No significant change since previous study.  IMPRESSION: No acute intracranial abnormalities.  Metallic coils consistent with previous aneurysm repair.   Original Report Authenticated By: Burman Nieves, M.D.      1. Atypical chest pain   2. Headache       MDM  1:30AM patient seen and evaluated. Patient resting comfortably appears in no acute distress. Pain has been persistent for almost one week. Symptoms are atypical for ACS. Patient is PERC negative.  She continues to do well. She has negative troponins x2.  Patient has a normal nonfocal neuro exam. CT unremarkable without changes.   At this time patient is felt stable for discharge home. She has been advised to follow up with PCP later today and discuss need for additional valuation treatments including possible stress test.    Date: 04/05/2012  Rate: 82  Rhythm: normal sinus rhythm  QRS Axis: normal  Intervals: normal  ST/T Wave abnormalities: nonspecific T wave changes  Conduction Disutrbances:none  Narrative Interpretation: Flipped T wave in III with q wave otherwise no sig changes.  Old EKG Reviewed: changes noted    Angus Seller, PA-C 04/05/12 0321

## 2012-04-05 NOTE — ED Notes (Signed)
Pt also c/o bilateral foot numbness, and pain behind left eye

## 2012-04-05 NOTE — ED Notes (Signed)
Presents with one week of left sided chest pain that radiates to left shoulder, back and neck. Denies pain with inspiration denies SOB, denies nausea. Describes pain as "icy feeling" when taking a breath.

## 2012-04-05 NOTE — ED Provider Notes (Signed)
Medical screening examination/treatment/procedure(s) were performed by non-physician practitioner and as supervising physician I was immediately available for consultation/collaboration.   Hanley Seamen, MD 04/05/12 [phone]

## 2012-04-10 ENCOUNTER — Ambulatory Visit (INDEPENDENT_AMBULATORY_CARE_PROVIDER_SITE_OTHER): Payer: BC Managed Care – PPO | Admitting: Internal Medicine

## 2012-04-10 ENCOUNTER — Encounter: Payer: Self-pay | Admitting: Internal Medicine

## 2012-04-10 ENCOUNTER — Other Ambulatory Visit (INDEPENDENT_AMBULATORY_CARE_PROVIDER_SITE_OTHER): Payer: BC Managed Care – PPO

## 2012-04-10 VITALS — BP 116/78 | HR 77 | Temp 98.8°F | Resp 16 | Wt 139.0 lb

## 2012-04-10 DIAGNOSIS — I10 Essential (primary) hypertension: Secondary | ICD-10-CM

## 2012-04-10 DIAGNOSIS — E0590 Thyrotoxicosis, unspecified without thyrotoxic crisis or storm: Secondary | ICD-10-CM

## 2012-04-10 DIAGNOSIS — R7309 Other abnormal glucose: Secondary | ICD-10-CM

## 2012-04-10 DIAGNOSIS — R519 Headache, unspecified: Secondary | ICD-10-CM | POA: Insufficient documentation

## 2012-04-10 DIAGNOSIS — R51 Headache: Secondary | ICD-10-CM

## 2012-04-10 DIAGNOSIS — R0609 Other forms of dyspnea: Secondary | ICD-10-CM

## 2012-04-10 DIAGNOSIS — I671 Cerebral aneurysm, nonruptured: Secondary | ICD-10-CM

## 2012-04-10 DIAGNOSIS — R0600 Dyspnea, unspecified: Secondary | ICD-10-CM

## 2012-04-10 DIAGNOSIS — R079 Chest pain, unspecified: Secondary | ICD-10-CM | POA: Insufficient documentation

## 2012-04-10 LAB — CBC WITH DIFFERENTIAL/PLATELET
Basophils Relative: 0.3 % (ref 0.0–3.0)
Eosinophils Absolute: 0.1 10*3/uL (ref 0.0–0.7)
HCT: 40.5 % (ref 36.0–46.0)
Hemoglobin: 14 g/dL (ref 12.0–15.0)
Lymphocytes Relative: 26.6 % (ref 12.0–46.0)
Lymphs Abs: 1.7 10*3/uL (ref 0.7–4.0)
MCHC: 34.7 g/dL (ref 30.0–36.0)
MCV: 92.5 fl (ref 78.0–100.0)
Neutro Abs: 4.3 10*3/uL (ref 1.4–7.7)
RBC: 4.38 Mil/uL (ref 3.87–5.11)

## 2012-04-10 LAB — COMPREHENSIVE METABOLIC PANEL
AST: 16 U/L (ref 0–37)
BUN: 8 mg/dL (ref 6–23)
Calcium: 9.1 mg/dL (ref 8.4–10.5)
Chloride: 101 mEq/L (ref 96–112)
Creatinine, Ser: 0.7 mg/dL (ref 0.4–1.2)
GFR: 129.35 mL/min (ref >60.00)

## 2012-04-10 LAB — TSH: TSH: 0.54 u[IU]/mL (ref 0.35–5.50)

## 2012-04-10 LAB — HEMOGLOBIN A1C: Hgb A1c MFr Bld: 5.6 % (ref 4.6–6.5)

## 2012-04-10 LAB — TROPONIN I: Troponin I: <0.01 ng/mL (ref <0.06)

## 2012-04-10 MED ORDER — TRAMADOL HCL 50 MG PO TABS: 50.0000 mg | ORAL_TABLET | Freq: Three times a day (TID) | ORAL | Status: DC | PRN

## 2012-04-10 NOTE — Progress Notes (Signed)
Subjective:    Patient ID: Kathy Chaney, female    DOB: [birth year], 37 y.o.   MRN: [id number]  Headache  This is a recurrent problem. The current episode started 1 to 4 weeks ago. The problem occurs intermittently. The problem has been unchanged. The pain is located in the left unilateral region. The pain does not radiate. The pain quality is similar to prior headaches. The quality of the pain is described as throbbing. The pain is at a severity of 2/10. The pain is mild. Pertinent negatives include no abdominal pain, abnormal behavior, anorexia, back pain, blurred vision, coughing, dizziness, drainage, ear pain, eye pain, eye redness, eye watering, facial sweating, fever, hearing loss, insomnia, loss of balance, muscle aches, nausea, neck pain, numbness, phonophobia, photophobia, rhinorrhea, scalp tenderness, seizures, sinus pressure, sore throat, swollen glands, tingling, tinnitus, visual change, vomiting, weakness or weight loss. Nothing aggravates the symptoms. She has tried nothing for the symptoms. Her past medical history is significant for migraine headaches and migraines in the family. There is no history of cancer, cluster headaches, hypertension, immunosuppression, obesity, pseudotumor cerebri, recent head traumas, sinus disease or TMJ.  Chest Pain  This is a recurrent problem. The current episode started 1 to 4 weeks ago. The onset quality is gradual. The problem occurs intermittently. The problem has been unchanged. The pain is present in the lateral region (left chest wall area). The pain is at a severity of 2/10. The quality of the pain is described as sharp. The pain does not radiate. Associated symptoms include headaches. Pertinent negatives include no abdominal pain, back pain, cough, diaphoresis, dizziness, fever, nausea, numbness, palpitations, shortness of breath, vomiting or weakness. The pain is aggravated by nothing. She has tried nothing for the symptoms.  Pertinent negatives for  past medical history include no cancer, no hypertension and no seizures.      Review of Systems  Constitutional: Negative for fever, chills, weight loss, diaphoresis, activity change, appetite change, fatigue and unexpected weight change.  HENT: Negative for hearing loss, ear pain, sore throat, rhinorrhea, neck pain, sinus pressure and tinnitus.   Eyes: Negative.  Negative for blurred vision, photophobia, pain and redness.  Respiratory: Negative.  Negative for apnea, cough, choking, chest tightness, shortness of breath, wheezing and stridor.   Cardiovascular: Positive for chest pain. Negative for palpitations and leg swelling.  Gastrointestinal: Negative.  Negative for nausea, vomiting, abdominal pain, diarrhea, constipation and anorexia.  Endocrine: Negative.   Genitourinary: Negative.   Musculoskeletal: Negative.  Negative for myalgias, back pain, joint swelling and gait problem.  Skin: Negative.  Negative for color change, pallor, rash and wound.  Allergic/Immunologic: Negative.   Neurological: Positive for headaches. Negative for dizziness, tingling, tremors, seizures, syncope, facial asymmetry, speech difficulty, weakness, light-headedness, numbness and loss of balance.  Hematological: Negative.  Negative for adenopathy. Does not bruise/bleed easily.  Psychiatric/Behavioral: Negative.  The patient does not have insomnia.        Objective:   Physical Exam  Vitals reviewed. Constitutional: She is oriented to person, place, and time. She appears well-developed and well-nourished. No distress.  HENT:  Head: Normocephalic and atraumatic.  Mouth/Throat: Oropharynx is clear and moist. No oropharyngeal exudate.  Eyes: Conjunctivae and EOM are normal. Pupils are equal, round, and reactive to light. Right eye exhibits no discharge. Left eye exhibits no discharge. No scleral icterus.  Neck: Normal range of motion. Neck supple. No JVD present. No tracheal deviation present. No thyromegaly  present.  Cardiovascular: Normal rate, regular rhythm, normal heart  sounds and intact distal pulses.  Exam reveals no gallop and no friction rub.   No murmur heard. Pulmonary/Chest: Effort normal and breath sounds normal. No stridor. No respiratory distress. She has no wheezes. She has no rales. She exhibits no tenderness.  Abdominal: Soft. Bowel sounds are normal. She exhibits no distension and no mass. There is no tenderness. There is no rebound and no guarding.  Musculoskeletal: Normal range of motion. She exhibits no edema and no tenderness.  Lymphadenopathy:    She has no cervical adenopathy.  Neurological: She is alert and oriented to person, place, and time. She has normal strength. She displays no atrophy, no tremor and normal reflexes. No cranial nerve deficit or sensory deficit. She exhibits normal muscle tone. She displays a negative Romberg sign. She displays no seizure activity. Coordination and gait normal. She displays no Babinski's sign on the right side. She displays no Babinski's sign on the left side.  Reflex Scores:      Tricep reflexes are 1+ on the right side and 1+ on the left side.      Bicep reflexes are 1+ on the right side and 1+ on the left side.      Brachioradialis reflexes are 1+ on the right side and 1+ on the left side.      Patellar reflexes are 1+ on the right side and 1+ on the left side.      Achilles reflexes are 1+ on the right side and 1+ on the left side. Skin: Skin is warm and dry. No rash noted. She is not diaphoretic. No erythema. No pallor.  Psychiatric: She has a normal mood and affect. Her behavior is normal. Judgment and thought content normal.      Lab Results  Component Value Date   WBC 9.1 04/05/2012   HGB 12.9 04/05/2012   HCT 36.4 04/05/2012   PLT 268 04/05/2012   GLUCOSE 131* 04/05/2012   CHOL  Value: 129        ATP III CLASSIFICATION:  <200     mg/dL   Desirable  161-096  mg/dL   Borderline High  >=045    mg/dL   High        04/26/8117    TRIG 20 02/05/2010   HDL 47 02/05/2010   LDLCALC  Value: 78        Total Cholesterol/HDL:CHD Risk Coronary Heart Disease Risk Table                     Men   Women  1/2 Average Risk   3.4   3.3  Average Risk       5.0   4.4  2 X Average Risk   9.6   7.1  3 X Average Risk  23.4   11.0        Use the calculated Patient Ratio above and the CHD Risk Table to determine the patient's CHD Risk.        ATP III CLASSIFICATION (LDL):  <100     mg/dL   Optimal  147-829  mg/dL   Near or Above                    Optimal  130-159  mg/dL   Borderline  562-130  mg/dL   High  >865     mg/dL   Very High 7/84/6962   ALT 22 12/30/2011   AST 21 12/30/2011   NA 139 04/05/2012   K 3.5  04/05/2012   CL 104 04/05/2012   CREATININE 0.58 04/05/2012   BUN 14 04/05/2012   CO2 25 04/05/2012   TSH 0.68 12/30/2011   INR 0.96 11/06/2010      Assessment & Plan:

## 2012-04-10 NOTE — Assessment & Plan Note (Signed)
In light of her HA I have asked her to get an MRI of the brain done

## 2012-04-10 NOTE — Assessment & Plan Note (Signed)
Her EKG remains normal I will check her cardiac enzymes today - if they are negative then, Will get an ETT done (at her request) I don't think her pain sounds cardiac

## 2012-04-10 NOTE — Assessment & Plan Note (Signed)
Her BP is well controlled today 

## 2012-04-10 NOTE — Assessment & Plan Note (Signed)
I will check her a1c to see if she has developed DM2 

## 2012-04-10 NOTE — Assessment & Plan Note (Signed)
I will check her TSH level today 

## 2012-04-10 NOTE — Patient Instructions (Signed)
Chest Pain (Nonspecific) It is often hard to give a specific diagnosis for the cause of chest pain. There is always a chance that your pain could be related to something serious, such as a heart attack or a blood clot in the lungs. You need to follow up with your caregiver for further evaluation. CAUSES   Heartburn.  Pneumonia or bronchitis.  Anxiety or stress.  Inflammation around your heart (pericarditis) or lung (pleuritis or pleurisy).  A blood clot in the lung.  A collapsed lung (pneumothorax). It can develop suddenly on its own (spontaneous pneumothorax) or from injury (trauma) to the chest.  Shingles infection (herpes zoster virus). The chest wall is composed of bones, muscles, and cartilage. Any of these can be the source of the pain.  The bones can be bruised by injury.  The muscles or cartilage can be strained by coughing or overwork.  The cartilage can be affected by inflammation and become sore (costochondritis). DIAGNOSIS  Lab tests or other studies, such as X-rays, electrocardiography, stress testing, or cardiac imaging, may be needed to find the cause of your pain.  TREATMENT   Treatment depends on what may be causing your chest pain. Treatment may include:  Acid blockers for heartburn.  Anti-inflammatory medicine.  Pain medicine for inflammatory conditions.  Antibiotics if an infection is present.  You may be advised to change lifestyle habits. This includes stopping smoking and avoiding alcohol, caffeine, and chocolate.  You may be advised to keep your head raised (elevated) when sleeping. This reduces the chance of acid going backward from your stomach into your esophagus.  Most of the time, nonspecific chest pain will improve within 2 to 3 days with rest and mild pain medicine. HOME CARE INSTRUCTIONS   If antibiotics were prescribed, take your antibiotics as directed. Finish them even if you start to feel better.  For the next few days, avoid physical  activities that bring on chest pain. Continue physical activities as directed.  Do not smoke.  Avoid drinking alcohol.  Only take over-the-counter or prescription medicine for pain, discomfort, or fever as directed by your caregiver.  Follow your caregiver's suggestions for further testing if your chest pain does not go away.  Keep any follow-up appointments you made. If you do not go to an appointment, you could develop lasting (chronic) problems with pain. If there is any problem keeping an appointment, you must call to reschedule. SEEK MEDICAL CARE IF:   You think you are having problems from the medicine you are taking. Read your medicine instructions carefully.  Your chest pain does not go away, even after treatment.  You develop a rash with blisters on your chest. SEEK IMMEDIATE MEDICAL CARE IF:   You have increased chest pain or pain that spreads to your arm, neck, jaw, back, or abdomen.  You develop shortness of breath, an increasing cough, or you are coughing up blood.  You have severe back or abdominal pain, feel nauseous, or vomit.  You develop severe weakness, fainting, or chills.  You have a fever. THIS IS AN EMERGENCY. Do not wait to see if the pain will go away. Get medical help at once. Call your local emergency services (911 in [place] Do not drive yourself to the hospital. MAKE SURE YOU:   Understand these instructions.  Will watch your condition.  Will get help right away if you are not doing well or get worse. Document Released: 10/14/2004 Document Revised: 03/29/2011 Document Reviewed: 08/10/2007 [employer] Patient Information 2013 [employer].  

## 2012-04-10 NOTE — Assessment & Plan Note (Signed)
She will try tramadol for the pain I have asked her to get an MRI of her brain done

## 2012-04-19 ENCOUNTER — Ambulatory Visit (INDEPENDENT_AMBULATORY_CARE_PROVIDER_SITE_OTHER): Payer: BC Managed Care – PPO | Admitting: Nurse Practitioner

## 2012-04-19 ENCOUNTER — Encounter: Payer: Self-pay | Admitting: Nurse Practitioner

## 2012-04-19 VITALS — BP 121/79 | HR 76 | Resp 20

## 2012-04-19 DIAGNOSIS — R0989 Other specified symptoms and signs involving the circulatory and respiratory systems: Secondary | ICD-10-CM

## 2012-04-19 DIAGNOSIS — R079 Chest pain, unspecified: Secondary | ICD-10-CM

## 2012-04-19 NOTE — Progress Notes (Signed)
Exercise Treadmill Test  Pre-Exercise Testing Evaluation Rhythm: normal sinus  Rate: 65                 Test  Exercise Tolerance Test Ordering MD: Sanda Linger, MD  Interpreting MD: Norma Fredrickson, NP  Unique Test No: 1  Treadmill:  1  Indication for ETT: chest pain - rule out ischemia  Contraindication to ETT: No   Stress Modality: exercise - treadmill  Cardiac Imaging Performed: non   Protocol: standard Bruce - maximal  Max BP:  178/72  Max MPHR (bpm):  183 85% MPR (bpm):  156  MPHR obtained (bpm):  169 % MPHR obtained:  92%  Reached 85% MPHR (min:sec):  7:18 Total Exercise Time (min-sec):  9 min  Workload in METS:  10.1 Borg Scale: 19  Reason ETT Terminated:  patient's desire to stop    ST Segment Analysis At Rest: normal ST segments - no evidence of significant ST depression With Exercise: no evidence of significant ST depression  Other Information Arrhythmia:  No Angina during ETT:  Mild chest discomfort/dyspnea Quality of ETT:  diagnostic  ETT Interpretation:  normal - no evidence of ischemia by ST analysis  Comments: Patient presents today for routine GXT. Has had atypical chest pain. Notes a left sided chest discomfort that has been present for several months. Not exertional related. Patient is adopted - no known family history. Smoker for over 20 years.   Today she exercised on the standard Bruce protocol. She exercised for a total of 9 minutes. Good exercise tolerance. Adequate blood pressure response. Clinically with mild chest discomfort but felt to be more dyspnea. EKG is negative for ischemia. No arrhythmia noted. Overall, the test is felt to be negative.   Recommendations: CV risk factor modification.  Smoking cessation.  See back prn.   Patient is agreeable to this plan and will call if any problems develop in the interim.   Rosalio Macadamia, RN, ANP-C  [facility] [address] [place], [place]  [address]

## 2012-04-19 NOTE — Progress Notes (Signed)
Patient ID: Kathy Chaney, female   DOB: [birth year], 37 y.o.   MRN: [id number]

## 2012-04-20 ENCOUNTER — Telehealth: Payer: Self-pay | Admitting: Internal Medicine

## 2012-04-20 NOTE — Telephone Encounter (Signed)
Patient Information:  Caller Name: Aylah  Phone: [phone]  Patient: Kathy Chaney, Kathy Chaney  Gender: Female  DOB: [birth year]  Age: 37 Years  PCP: Sanda Linger (Adults only)  Pregnant: No  Office Follow Up:  Does the office need to follow up with this patient?: No  Instructions For The Office: N/A  RN Note:  Patient states she does not want to schedule appointment at this time; she will call later to schedule if not improved by 04/26/12.  Symptoms  Reason For Call & Symptoms: Patient calling about "severe acid reflux".  "Burning/ heartburn, burp a lot and acid comes up."  Pain rated at 10 during episodes, which last estimated 1 minute.  Emergent symptoms ruled out.  See Within 2 Weeks in Office per Abdominal Pain - Upper protocol.  Reviewed Health History In EMR: Yes  Reviewed Medications In EMR: Yes  Reviewed Allergies In EMR: Yes  Reviewed Surgeries / Procedures: Yes  Date of Onset of Symptoms: 04/14/2012  Treatments Tried: Tums, Baking Soda, Apple Cider  Treatments Tried Worked: No OB / GYN:  LMP: 03/23/2012  Guideline(s) Used:  Abdominal Pain - Upper  Disposition Per Guideline:   See Within 2 Weeks in Office  Reason For Disposition Reached:   Intermittent burning pains radiating into chest or sour taste in mouth  Advice Given:  Reassurance:  A mild stomachache can be from indigestion, stomach irritation, or overeating. Sometimes a stomachache signals the onset of a vomiting illness from a viral infection.  Here is some care advice that should help.  Fluids:   Sip clear fluids only (e.g., water, flat soft drinks, or half-strength fruit juice) until the pain is gone for 2 hours. Then slowly return to a regular diet.  Diet:  Slowly advance diet from clear liquids to a bland diet.  Avoid alcohol or caffeinated beverages.  Avoid greasy or fatty foods.  Antacid:  If having pain now, try taking an antacid (e.g., Mylanta, Maalox). Dose: 2 tablespoons (30 ml) of liquid by  mouth.  Avoid NSAIDS and Aspirin  : Avoid any drug that can irritate the stomach lining and make the pain worse (especially aspirin and NSAIDs like ibuprofen).  Stop Smoking:  Smoking can aggravate heartburn and stomach problems.  Reducing Reflux Symptoms (GERD):  Eat smaller meals and avoid snacks for 2 hours before sleeping. Avoid the following foods, which tend to aggravate heartburn and stomach problems: fatty/greasy foods, spicy foods, caffeinated beverages, mints, and chocolate.  Expected Course:  With harmless causes, the pain usually lessens or is resolved in 2 hours. With gastroenteritis, stomach cramps may precede each bout of vomiting or diarrhea. With serious causes (such as appendicitis), the pain becomes constant and severe.  Call Back If:  Abdominal pain is constant and present for more than 2 hours.  You become worse.  Patient Refused Recommendation:  Patient Refused Appt, Patient Requests Appt At Later Date  States plan to do home care and will call later to schedule if not improved.

## 2012-07-11 ENCOUNTER — Other Ambulatory Visit (HOSPITAL_COMMUNITY): Payer: Self-pay | Admitting: Interventional Radiology

## 2012-07-11 DIAGNOSIS — I729 Aneurysm of unspecified site: Secondary | ICD-10-CM

## 2012-07-13 ENCOUNTER — Ambulatory Visit (HOSPITAL_COMMUNITY)
Admission: RE | Admit: 2012-07-13 | Discharge: 2012-07-13 | Disposition: A | Discharge status: Home / Self Care | Payer: BC Managed Care – PPO | Source: Ambulatory Visit | Attending: Interventional Radiology | Admitting: Interventional Radiology

## 2012-07-13 DIAGNOSIS — I729 Aneurysm of unspecified site: Secondary | ICD-10-CM

## 2012-07-14 ENCOUNTER — Other Ambulatory Visit (HOSPITAL_COMMUNITY): Payer: Self-pay | Admitting: Interventional Radiology

## 2012-07-14 ENCOUNTER — Ambulatory Visit (HOSPITAL_COMMUNITY)
Admission: RE | Admit: 2012-07-14 | Discharge: 2012-07-14 | Disposition: A | Discharge status: Home / Self Care | Payer: BC Managed Care – PPO | Source: Ambulatory Visit | Attending: Interventional Radiology | Admitting: Interventional Radiology

## 2012-07-14 ENCOUNTER — Ambulatory Visit (HOSPITAL_COMMUNITY): Payer: BC Managed Care – PPO

## 2012-07-14 DIAGNOSIS — H5710 Ocular pain, unspecified eye: Secondary | ICD-10-CM | POA: Insufficient documentation

## 2012-07-14 DIAGNOSIS — I729 Aneurysm of unspecified site: Secondary | ICD-10-CM

## 2012-07-14 DIAGNOSIS — H538 Other visual disturbances: Secondary | ICD-10-CM

## 2012-07-14 DIAGNOSIS — I671 Cerebral aneurysm, nonruptured: Secondary | ICD-10-CM | POA: Insufficient documentation

## 2012-07-14 DIAGNOSIS — M542 Cervicalgia: Secondary | ICD-10-CM | POA: Insufficient documentation

## 2012-07-14 LAB — CREATININE, SERUM
Creatinine, Ser: 0.62 mg/dL (ref 0.50–1.10)
GFR calc Af Amer: >90 mL/min (ref >90)
GFR calc non Af Amer: >90 mL/min (ref >90)

## 2012-07-14 MED ORDER — GADOBENATE DIMEGLUMINE 529 MG/ML IV SOLN
15.0000 mL | Freq: Once | INTRAVENOUS | Status: AC | PRN
  Administered 2012-07-14: 13 mL via INTRAVENOUS

## 2012-07-18 ENCOUNTER — Telehealth (HOSPITAL_COMMUNITY): Payer: Self-pay | Admitting: Interventional Radiology

## 2012-07-18 NOTE — Telephone Encounter (Signed)
Called pt to tell her about her MRI and that we will contact her in 6 months for f/u MRI vs. Angio per Deveshwar JMichaux

## 2012-09-03 ENCOUNTER — Encounter (HOSPITAL_COMMUNITY): Payer: Self-pay | Admitting: *Deleted

## 2012-09-03 ENCOUNTER — Emergency Department (HOSPITAL_COMMUNITY)
Admission: EM | Admit: 2012-09-03 | Discharge: 2012-09-03 | Disposition: A | Discharge status: Home / Self Care | Payer: BC Managed Care – PPO | Source: Home / Self Care | Attending: Emergency Medicine | Admitting: Emergency Medicine

## 2012-09-03 ENCOUNTER — Other Ambulatory Visit (HOSPITAL_COMMUNITY)
Admission: RE | Admit: 2012-09-03 | Discharge: 2012-09-03 | Disposition: A | Discharge status: Home / Self Care | Payer: BC Managed Care – PPO | Source: Ambulatory Visit | Attending: Emergency Medicine | Admitting: Emergency Medicine

## 2012-09-03 DIAGNOSIS — Z113 Encounter for screening for infections with a predominantly sexual mode of transmission: Secondary | ICD-10-CM | POA: Insufficient documentation

## 2012-09-03 DIAGNOSIS — N760 Acute vaginitis: Secondary | ICD-10-CM | POA: Insufficient documentation

## 2012-09-03 DIAGNOSIS — N949 Unspecified condition associated with female genital organs and menstrual cycle: Secondary | ICD-10-CM

## 2012-09-03 DIAGNOSIS — R102 Pelvic and perineal pain: Secondary | ICD-10-CM

## 2012-09-03 LAB — POCT URINALYSIS DIP (DEVICE)
Glucose, UA: NEGATIVE mg/dL
Ketones, ur: NEGATIVE mg/dL
Leukocytes, UA: NEGATIVE
Leukocytes, UA: NEGATIVE
Nitrite: NEGATIVE
Nitrite: NEGATIVE
Protein, ur: NEGATIVE mg/dL
Protein, ur: NEGATIVE mg/dL
Specific Gravity, Urine: >=1.03 (ref 1.005–1.030)
Urobilinogen, UA: 0.2 mg/dL (ref 0.0–1.0)
Urobilinogen, UA: 0.2 mg/dL (ref 0.0–1.0)
pH: 5.5 (ref 5.0–8.0)

## 2012-09-03 LAB — POCT PREGNANCY, URINE: Preg Test, Ur: NEGATIVE

## 2012-09-03 MED ORDER — AZITHROMYCIN 250 MG PO TABS
ORAL_TABLET | ORAL | Status: AC
  Filled 2012-09-03: qty 4

## 2012-09-03 MED ORDER — METRONIDAZOLE 500 MG PO TABS: 500.0000 mg | ORAL_TABLET | Freq: Two times a day (BID) | ORAL | Status: DC

## 2012-09-03 MED ORDER — IBUPROFEN 600 MG PO TABS: 600.0000 mg | ORAL_TABLET | Freq: Three times a day (TID) | ORAL | Status: DC

## 2012-09-03 MED ORDER — KETOROLAC TROMETHAMINE 30 MG/ML IJ SOLN: 30.0000 mg | Freq: Once | INTRAMUSCULAR | Status: DC

## 2012-09-03 MED ORDER — CEFTRIAXONE SODIUM 250 MG IJ SOLR
INTRAMUSCULAR | Status: AC
  Filled 2012-09-03: qty 250

## 2012-09-03 MED ORDER — KETOROLAC TROMETHAMINE 30 MG/ML IJ SOLN
INTRAMUSCULAR | Status: AC
  Filled 2012-09-03: qty 1

## 2012-09-03 MED ORDER — KETOROLAC TROMETHAMINE 30 MG/ML IJ SOLN
30.0000 mg | Freq: Once | INTRAMUSCULAR | Status: AC
  Administered 2012-09-03: 30 mg via INTRAMUSCULAR

## 2012-09-03 MED ORDER — AZITHROMYCIN 250 MG PO TABS
1000.0000 mg | ORAL_TABLET | Freq: Every day | ORAL | Status: DC
  Administered 2012-09-03 (×2): 1000 mg via ORAL

## 2012-09-03 MED ORDER — LIDOCAINE HCL (PF) 1 % IJ SOLN
INTRAMUSCULAR | Status: AC
  Filled 2012-09-03: qty 5

## 2012-09-03 MED ORDER — CEFTRIAXONE SODIUM 250 MG IJ SOLR
250.0000 mg | Freq: Once | INTRAMUSCULAR | Status: AC
  Administered 2012-09-03: 250 mg via INTRAMUSCULAR

## 2012-09-03 NOTE — ED Notes (Addendum)
C/o L lower abdominal pain onset last night.  No hx. of pain here before.  LMP 7/12.  Missed her period this month. State her breasts are sore. They usually get sore when she has her period.  States her tubes are tied.  Pain off and on today.  Last BM this morning and states abdomen hurt when she pushed.  No N or V.  Has had diarrhea x 3 today. Denies UTI symptoms or vaginal discharge.

## 2012-09-03 NOTE — ED Provider Notes (Signed)
CSN: [id number]     Arrival date & time 09/03/12  1802 History     First MD Initiated Contact with Patient 09/03/12 1929     Chief Complaint  Patient presents with  . Abdominal Pain   (Consider location/radiation/quality/duration/timing/severity/associated sxs/prior Treatment) HPI Comments: Pt with pain during intercourse similar to today's pain on and off for 6 months. Last night pain began but hasn't gone away. Pt is concerned because her period is late and she has had her tubes tied.   Patient is a 37 y.o. female presenting with abdominal pain.  Abdominal Pain This is a recurrent problem. The current episode started yesterday. The problem occurs constantly. The problem has been gradually worsening. Associated symptoms include abdominal pain. The symptoms are aggravated by intercourse. Nothing relieves the symptoms.    Past Medical History  Diagnosis Date  . Hypertension   . Headache(784.0)   . Low back pain   . 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 Tobacco abuse    Past Surgical History  Procedure Laterality Date  . Tubal ligation    . Aneurysm coiling  01/2010    basilar artery  . Hernia repair     Family History  Problem Relation Age of Onset  . Adopted: Yes  . Alcohol abuse Neg Hx   . Cancer Neg Hx   . Early death Neg Hx   . Heart disease Neg Hx   . Hyperlipidemia Neg Hx   . Hypertension Neg Hx   . Stroke Neg Hx    History  Substance Use Topics  . Smoking status: Current Every Day Smoker -- 0.50 packs/day for 15 years    Types: Cigarettes  . Smokeless tobacco: Not on file  . Alcohol Use: Yes     Comment: social   OB History   Grav Para Term Preterm Abortions TAB SAB Ect Mult Living                 Review of Systems  Constitutional: Negative for fever and chills.  Gastrointestinal: Positive for abdominal pain and diarrhea. Negative for nausea, vomiting, constipation and blood in stool.  Genitourinary: Positive for pelvic pain and dyspareunia.  Negative for dysuria, hematuria, flank pain, vaginal discharge, genital sores and vaginal pain.    Allergies  Amlodipine  Home Medications   Current Outpatient Rx  Name  Route  Sig  Dispense  Refill  . hydrochlorothiazide (MICROZIDE) 12.5 MG capsule   Oral   Take 1 capsule (12.5 mg total) by mouth daily.   90 capsule   1   . ibuprofen (ADVIL,MOTRIN) 600 MG tablet   Oral   Take 1 tablet (600 mg total) by mouth 3 (three) times daily.   21 tablet   0   . metroNIDAZOLE (FLAGYL) 500 MG tablet   Oral   Take 1 tablet (500 mg total) by mouth 2 (two) times daily.   20 tablet   0   . traMADol (ULTRAM) 50 MG tablet   Oral   Take 1 tablet (50 mg total) by mouth every 8 (eight) hours as needed for pain.   50 tablet   1    BP 137/93  Pulse 82  Temp(Src) 98.7 F (37.1 C) (Oral)  Resp 16  SpO2 99%  LMP 07/29/2012 Physical Exam  Constitutional: She appears well-developed and well-nourished. No distress.  Cardiovascular: Normal rate and regular rhythm.   Pulmonary/Chest: Effort normal and breath sounds normal.  Abdominal: Soft. Bowel sounds are  normal. She exhibits no distension. There is tenderness in the suprapubic area and left lower quadrant. There is no rigidity, no rebound, no guarding and no CVA tenderness.  Genitourinary: There is no rash, tenderness, lesion or injury on the right labia. There is no rash, tenderness, lesion or injury on the left labia. Uterus is tender. Uterus is not enlarged. Cervix exhibits motion tenderness. Cervix exhibits no discharge. Right adnexum displays no mass, no tenderness and no fullness. Left adnexum displays tenderness. Left adnexum displays no mass. No erythema, tenderness or bleeding around the vagina. No signs of injury around the vagina. No vaginal discharge found.  Lymphadenopathy:       Right: No inguinal adenopathy present.       Left: No inguinal adenopathy present.    ED Course   Procedures (including critical care time)  Labs  Reviewed  POCT URINALYSIS DIP (DEVICE) - Abnormal; Notable for the following:    Hgb urine dipstick TRACE (*)    All other components within normal limits  POCT URINALYSIS DIP (DEVICE) - Abnormal; Notable for the following:    Hgb urine dipstick TRACE (*)    All other components within normal limits  POCT PREGNANCY, URINE  CERVICOVAGINAL ANCILLARY ONLY   No results found. 1. Adnexal pain     MDM  L adnexal pain. PID is on the differential, as is something like an ovarian cyst. Pt given toradol 30mg  IM here for pain, and tx with zithromax 1000mg  po and rocephin 250mg  IM.  Rx for ibuprofen 600mg  TID prn pain #21, and flagyl 500mg  po BID #20. [facility] pain.   Cathlyn Parsons, NP 09/03/12 1950

## 2012-09-04 NOTE — ED Provider Notes (Signed)
Medical screening examination/treatment/procedure(s) were performed by non-physician practitioner and as supervising physician I was immediately available for consultation/collaboration.  Leslee Home, M.D.   Reuben Likes, MD 09/04/12 [phone]

## 2012-09-25 ENCOUNTER — Telehealth (HOSPITAL_COMMUNITY): Payer: Self-pay | Admitting: Interventional Radiology

## 2012-09-25 ENCOUNTER — Other Ambulatory Visit: Payer: Self-pay | Admitting: Internal Medicine

## 2012-09-25 ENCOUNTER — Telehealth: Payer: Self-pay | Admitting: *Deleted

## 2012-09-25 DIAGNOSIS — R51 Headache: Secondary | ICD-10-CM

## 2012-09-25 DIAGNOSIS — I671 Cerebral aneurysm, nonruptured: Secondary | ICD-10-CM

## 2012-09-25 NOTE — Telephone Encounter (Signed)
Called pt to set up 1 yr f/u MRI study, unable to leave VM as her mailbox was full, will try to call her back JMichaux

## 2012-09-25 NOTE — Telephone Encounter (Signed)
Pt called states she received a call from Radiology to schedule a follow up MRI s/p aneurysm.  Pt states she was having problems in 6.2014 and Neurologist, Dr Corliss Skains scheduled an MRA.  Pt requests whether follow up MRI is needed. Pt further states the HCTZ gives her headaches. Please advise

## 2012-09-25 NOTE — Telephone Encounter (Signed)
She should call her neurologist about this and f/up with me about the HCTZ

## 2012-09-25 NOTE — Telephone Encounter (Signed)
Spoke with pt advised of MDs message.  States she will call back later for appointment with Dr Yetta Barre.

## 2012-10-17 ENCOUNTER — Ambulatory Visit: Payer: BC Managed Care – PPO | Admitting: Internal Medicine

## 2012-10-20 ENCOUNTER — Other Ambulatory Visit (INDEPENDENT_AMBULATORY_CARE_PROVIDER_SITE_OTHER): Payer: BC Managed Care – PPO

## 2012-10-20 ENCOUNTER — Encounter: Payer: Self-pay | Admitting: Internal Medicine

## 2012-10-20 ENCOUNTER — Ambulatory Visit (INDEPENDENT_AMBULATORY_CARE_PROVIDER_SITE_OTHER)
Admission: RE | Admit: 2012-10-20 | Discharge: 2012-10-20 | Disposition: A | Discharge status: Home / Self Care | Payer: BC Managed Care – PPO | Source: Ambulatory Visit | Attending: Internal Medicine | Admitting: Internal Medicine

## 2012-10-20 ENCOUNTER — Ambulatory Visit (INDEPENDENT_AMBULATORY_CARE_PROVIDER_SITE_OTHER): Payer: BC Managed Care – PPO | Admitting: Internal Medicine

## 2012-10-20 VITALS — BP 130/92 | HR 81 | Temp 98.3°F | Resp 16 | Wt 141.6 lb

## 2012-10-20 DIAGNOSIS — R7309 Other abnormal glucose: Secondary | ICD-10-CM

## 2012-10-20 DIAGNOSIS — E0590 Thyrotoxicosis, unspecified without thyrotoxic crisis or storm: Secondary | ICD-10-CM

## 2012-10-20 DIAGNOSIS — R109 Unspecified abdominal pain: Secondary | ICD-10-CM | POA: Insufficient documentation

## 2012-10-20 DIAGNOSIS — I10 Essential (primary) hypertension: Secondary | ICD-10-CM

## 2012-10-20 DIAGNOSIS — M545 Low back pain: Secondary | ICD-10-CM

## 2012-10-20 DIAGNOSIS — F17200 Nicotine dependence, unspecified, uncomplicated: Secondary | ICD-10-CM

## 2012-10-20 DIAGNOSIS — I671 Cerebral aneurysm, nonruptured: Secondary | ICD-10-CM

## 2012-10-20 LAB — URINALYSIS, ROUTINE W REFLEX MICROSCOPIC
Bilirubin Urine: NEGATIVE
Ketones, ur: NEGATIVE
Total Protein, Urine: NEGATIVE
Urine Glucose: NEGATIVE
pH: 6 (ref 5.0–8.0)

## 2012-10-20 LAB — COMPREHENSIVE METABOLIC PANEL
ALT: 16 U/L (ref 0–35)
AST: 17 U/L (ref 0–37)
Albumin: 4.2 g/dL (ref 3.5–5.2)
Alkaline Phosphatase: 50 U/L (ref 39–117)
BUN: 9 mg/dL (ref 6–23)
Calcium: 9.3 mg/dL (ref 8.4–10.5)
Chloride: 108 mEq/L (ref 96–112)
Potassium: 3.4 mEq/L — ABNORMAL LOW (ref 3.5–5.1)

## 2012-10-20 LAB — CBC WITH DIFFERENTIAL/PLATELET
Basophils Absolute: 0 10*3/uL (ref 0.0–0.1)
Basophils Relative: 0.4 % (ref 0.0–3.0)
Eosinophils Absolute: 0.1 10*3/uL (ref 0.0–0.7)
Hemoglobin: 13.2 g/dL (ref 12.0–15.0)
Lymphocytes Relative: 24.9 % (ref 12.0–46.0)
MCHC: 34.1 g/dL (ref 30.0–36.0)
MCV: 93.1 fl (ref 78.0–100.0)
Monocytes Absolute: 0.5 10*3/uL (ref 0.1–1.0)
Neutro Abs: 5 10*3/uL (ref 1.4–7.7)
Neutrophils Relative %: 67.2 % (ref 43.0–77.0)
RBC: 4.17 Mil/uL (ref 3.87–5.11)
RDW: 13.3 % (ref 11.5–14.6)

## 2012-10-20 LAB — T4: T4, Total: 9.1 ug/dL (ref 5.0–12.5)

## 2012-10-20 MED ORDER — NEBIVOLOL HCL 5 MG PO TABS: 5.0000 mg | ORAL_TABLET | Freq: Every day | ORAL | Status: DC

## 2012-10-20 NOTE — Progress Notes (Signed)
Subjective:    Patient ID: Kathy Chaney, female    DOB: August 22, 1975, 37 y.o.   MRN: [id number]  Hypertension This is a chronic problem. The current episode started more than 1 year ago. The problem has been gradually worsening since onset. The problem is uncontrolled. Associated symptoms include anxiety and headaches (she attributes her HA to HCTZ). Pertinent negatives include no blurred vision, chest pain, malaise/fatigue, neck pain, orthopnea, palpitations, peripheral edema, PND, shortness of breath or sweats. Risk factors for coronary artery disease include smoking/tobacco exposure and stress. Past treatments include diuretics. The current treatment provides moderate improvement. Compliance problems include diet, exercise and medication side effects.       Review of Systems  Constitutional: Negative.  Negative for fever, chills, malaise/fatigue, diaphoresis and fatigue.  HENT: Negative for neck pain.   Eyes: Negative.  Negative for blurred vision.  Respiratory: Negative.  Negative for cough, choking, chest tightness, shortness of breath, wheezing and stridor.   Cardiovascular: Negative.  Negative for chest pain, palpitations, orthopnea, leg swelling and PND.  Gastrointestinal: Negative.  Negative for nausea, vomiting, abdominal pain, diarrhea, constipation and blood in stool.  Endocrine: Negative.   Genitourinary: Negative.  Negative for dysuria, urgency, frequency, hematuria, decreased urine volume, vaginal discharge, enuresis, difficulty urinating, genital sores, vaginal pain, menstrual problem, pelvic pain and dyspareunia. Flank pain: chronic, recurrent episodes of right flank pain.  Musculoskeletal: Positive for back pain (chronic, right low back pain). Negative for myalgias, joint swelling, arthralgias and gait problem.  Skin: Negative.   Allergic/Immunologic: Negative.   Neurological: Positive for headaches (she attributes her HA to HCTZ). Negative for dizziness, tremors, seizures,  syncope, facial asymmetry, speech difficulty, weakness, light-headedness and numbness.  Hematological: Negative.  Negative for adenopathy. Does not bruise/bleed easily.  Psychiatric/Behavioral: Negative for suicidal ideas, hallucinations, behavioral problems, confusion, sleep disturbance, self-injury, dysphoric mood, decreased concentration and agitation. The patient is nervous/anxious. The patient is not hyperactive.        Objective:   Physical Exam  Vitals reviewed. Constitutional: She is oriented to person, place, and time. She appears well-developed and well-nourished.  Non-toxic appearance. She does not have a sickly appearance. She does not appear ill. No distress.  HENT:  Head: Normocephalic and atraumatic.  Mouth/Throat: Oropharynx is clear and moist. No oropharyngeal exudate.  Eyes: Conjunctivae are normal. Right eye exhibits no discharge. Left eye exhibits no discharge. No scleral icterus.  Neck: Normal range of motion. Neck supple. No JVD present. No tracheal deviation present. No thyromegaly present.  Cardiovascular: Normal rate, regular rhythm, normal heart sounds and intact distal pulses.  Exam reveals no gallop and no friction rub.   No murmur heard. Pulmonary/Chest: Effort normal and breath sounds normal. No stridor. No respiratory distress. She has no wheezes. She has no rales. She exhibits no tenderness.  Abdominal: Soft. Normal appearance and bowel sounds are normal. She exhibits no distension, no ascites and no mass. There is no hepatosplenomegaly, splenomegaly or hepatomegaly. There is no tenderness. There is no rebound, no guarding and no CVA tenderness. No hernia. Hernia confirmed negative in the ventral area, confirmed negative in the right inguinal area and confirmed negative in the left inguinal area.  Musculoskeletal: Normal range of motion. She exhibits no edema.       Lumbar back: Normal. She exhibits normal range of motion, no tenderness, no bony tenderness, no  swelling, no edema, no deformity, no laceration, no pain, no spasm and normal pulse.  Lymphadenopathy:    She has no cervical adenopathy.  Neurological: She is alert and oriented to person, place, and time. She has normal strength. She displays no atrophy and no tremor. No cranial nerve deficit or sensory deficit. She exhibits normal muscle tone. She displays a negative Romberg sign. She displays no seizure activity. Coordination and gait normal.  Reflex Scores:      Tricep reflexes are 2+ on the right side and 2+ on the left side.      Bicep reflexes are 2+ on the right side and 2+ on the left side.      Brachioradialis reflexes are 2+ on the right side and 2+ on the left side.      Patellar reflexes are 2+ on the right side and 2+ on the left side.      Achilles reflexes are 2+ on the right side and 2+ on the left side. Skin: Skin is warm and dry. No rash noted. She is not diaphoretic. No erythema. No pallor.  Psychiatric: She has a normal mood and affect. Her behavior is normal. Judgment and thought content normal.     Lab Results  Component Value Date   WBC 6.5 04/10/2012   HGB 14.0 04/10/2012   HCT 40.5 04/10/2012   PLT 268.0 04/10/2012   GLUCOSE 102* 04/10/2012   CHOL  Value: 129        ATP III CLASSIFICATION:  <200     mg/dL   Desirable  161-096  mg/dL   Borderline High  >=045    mg/dL   High        04/26/8117   TRIG 20 02/05/2010   HDL 47 02/05/2010   LDLCALC  Value: 78        Total Cholesterol/HDL:CHD Risk Coronary Heart Disease Risk Table                     Men   Women  1/2 Average Risk   3.4   3.3  Average Risk       5.0   4.4  2 X Average Risk   9.6   7.1  3 X Average Risk  23.4   11.0        Use the calculated Patient Ratio above and the CHD Risk Table to determine the patient's CHD Risk.        ATP III CLASSIFICATION (LDL):  <100     mg/dL   Optimal  147-829  mg/dL   Near or Above                    Optimal  130-159  mg/dL   Borderline  562-130  mg/dL   High  >865     mg/dL   Very  High 7/84/6962   ALT 14 04/10/2012   AST 16 04/10/2012   NA 137 04/10/2012   K 3.7 04/10/2012   CL 101 04/10/2012   CREATININE 0.62 07/14/2012   BUN 8 04/10/2012   CO2 30 04/10/2012   TSH 0.54 04/10/2012   INR 0.96 11/06/2010   HGBA1C 5.6 04/10/2012       Assessment & Plan:

## 2012-10-20 NOTE — Patient Instructions (Signed)

## 2012-10-22 ENCOUNTER — Encounter: Payer: Self-pay | Admitting: Internal Medicine

## 2012-10-22 NOTE — Assessment & Plan Note (Signed)
I will check her A1C to see if she has developed DM2 

## 2012-10-22 NOTE — Assessment & Plan Note (Signed)
I will check labs and xray to see if I can find that cause for this pain

## 2012-10-22 NOTE — Assessment & Plan Note (Signed)
She tells me that she saw Dr. Eustace Quail recently and that the aneurysm was stable

## 2012-10-22 NOTE — Assessment & Plan Note (Signed)
No dangerous s/s noted

## 2012-10-22 NOTE — Assessment & Plan Note (Signed)
At her request, she will stop the HCTZ and will try bystolic for HTN

## 2012-10-22 NOTE — Assessment & Plan Note (Signed)
She is not willing to quit smoking at this time

## 2012-10-22 NOTE — Assessment & Plan Note (Signed)
Her labs today show that she is euthyroid

## 2012-10-30 ENCOUNTER — Telehealth (HOSPITAL_COMMUNITY): Payer: Self-pay | Admitting: Interventional Radiology

## 2012-11-23 ENCOUNTER — Other Ambulatory Visit: Payer: Self-pay

## 2013-02-22 ENCOUNTER — Ambulatory Visit (HOSPITAL_COMMUNITY)
Admission: RE | Admit: 2013-02-22 | Discharge: 2013-02-22 | Disposition: A | Discharge status: Home / Self Care | Payer: BC Managed Care – PPO | Source: Ambulatory Visit | Attending: Internal Medicine | Admitting: Internal Medicine

## 2013-02-22 DIAGNOSIS — R51 Headache: Secondary | ICD-10-CM

## 2013-02-22 DIAGNOSIS — I671 Cerebral aneurysm, nonruptured: Secondary | ICD-10-CM | POA: Insufficient documentation

## 2013-02-22 DIAGNOSIS — Z9889 Other specified postprocedural states: Secondary | ICD-10-CM | POA: Insufficient documentation

## 2013-02-22 LAB — CREATININE, SERUM
Creatinine, Ser: 0.71 mg/dL (ref 0.50–1.10)
GFR calc Af Amer: >90 mL/min (ref >90)

## 2013-02-22 MED ORDER — GADOBENATE DIMEGLUMINE 529 MG/ML IV SOLN
15.0000 mL | Freq: Once | INTRAVENOUS | Status: AC
Start: 2013-02-22 — End: 2013-02-22
  Administered 2013-02-22: 13 mL via INTRAVENOUS

## 2013-02-26 ENCOUNTER — Telehealth (HOSPITAL_COMMUNITY): Payer: Self-pay | Admitting: Interventional Radiology

## 2013-02-26 NOTE — Telephone Encounter (Signed)
Called pt told her that her MRI was stable and she would be checked again in 1 yr. Pt states understanding and is in agreement with this plan of care. JM

## 2013-03-21 ENCOUNTER — Emergency Department (INDEPENDENT_AMBULATORY_CARE_PROVIDER_SITE_OTHER): Payer: BC Managed Care – PPO

## 2013-03-21 ENCOUNTER — Emergency Department (HOSPITAL_COMMUNITY)
Admission: EM | Admit: 2013-03-21 | Discharge: 2013-03-21 | Disposition: A | Discharge status: Home / Self Care | Payer: BC Managed Care – PPO | Source: Home / Self Care | Attending: Family Medicine | Admitting: Family Medicine

## 2013-03-21 ENCOUNTER — Encounter (HOSPITAL_COMMUNITY): Payer: Self-pay | Admitting: Emergency Medicine

## 2013-03-21 DIAGNOSIS — R109 Unspecified abdominal pain: Secondary | ICD-10-CM

## 2013-03-21 LAB — COMPREHENSIVE METABOLIC PANEL
ALBUMIN: 4.2 g/dL (ref 3.5–5.2)
ALK PHOS: 73 U/L (ref 39–117)
ALT: 17 U/L (ref 0–35)
AST: 17 U/L (ref 0–37)
BILIRUBIN TOTAL: 0.3 mg/dL (ref 0.3–1.2)
BUN: 8 mg/dL (ref 6–23)
CHLORIDE: 101 meq/L (ref 96–112)
CO2: 27 meq/L (ref 19–32)
Calcium: 9.6 mg/dL (ref 8.4–10.5)
Creatinine, Ser: 0.65 mg/dL (ref 0.50–1.10)
GLUCOSE: 85 mg/dL (ref 70–99)
POTASSIUM: 3.7 meq/L (ref 3.7–5.3)
Sodium: 141 mEq/L (ref 137–147)
Total Protein: 7.8 g/dL (ref 6.0–8.3)

## 2013-03-21 LAB — POCT URINALYSIS DIP (DEVICE)
BILIRUBIN URINE: NEGATIVE
GLUCOSE, UA: NEGATIVE mg/dL
Hgb urine dipstick: NEGATIVE
KETONES UR: NEGATIVE mg/dL
NITRITE: NEGATIVE
PH: 7 (ref 5.0–8.0)
Protein, ur: NEGATIVE mg/dL
Specific Gravity, Urine: 1.01 (ref 1.005–1.030)
Urobilinogen, UA: 0.2 mg/dL (ref 0.0–1.0)

## 2013-03-21 LAB — POCT PREGNANCY, URINE: PREG TEST UR: NEGATIVE

## 2013-03-21 LAB — CBC
HCT: 39 % (ref 36.0–46.0)
Hemoglobin: 13.9 g/dL (ref 12.0–15.0)
MCH: 31.9 pg (ref 26.0–34.0)
MCHC: 35.6 g/dL (ref 30.0–36.0)
MCV: 89.4 fL (ref 78.0–100.0)
Platelets: 291 10*3/uL (ref 150–400)
RBC: 4.36 MIL/uL (ref 3.87–5.11)
RDW: 13.2 % (ref 11.5–15.5)
WBC: 7.8 10*3/uL (ref 4.0–10.5)

## 2013-03-21 LAB — LIPASE, BLOOD: LIPASE: 30 U/L (ref 11–59)

## 2013-03-21 MED ORDER — ONDANSETRON HCL 4 MG PO TABS: 4.0000 mg | ORAL_TABLET | Freq: Three times a day (TID) | ORAL | Status: DC | PRN

## 2013-03-21 NOTE — ED Notes (Addendum)
Patient transported to X-ray 

## 2013-03-21 NOTE — Discharge Instructions (Signed)
Abdominal Pain, Adult Many things can cause abdominal pain. Usually, abdominal pain is not caused by a disease and will improve without treatment. It can often be observed and treated at home. Your health care provider will do a physical exam and possibly order blood tests and X-rays to help determine the seriousness of your pain. However, in many cases, more time must pass before a clear cause of the pain can be found. Before that point, your health care provider may not know if you need more testing or further treatment. HOME CARE INSTRUCTIONS  Monitor your abdominal pain for any changes. The following actions may help to alleviate any discomfort you are experiencing:  Only take over-the-counter or prescription medicines as directed by your health care provider.  Do not take laxatives unless directed to do so by your health care provider.  Try a clear liquid diet (broth, tea, or water) as directed by your health care provider. Slowly move to a bland diet as tolerated. SEEK MEDICAL CARE IF:  You have unexplained abdominal pain.  You have abdominal pain associated with nausea or diarrhea.  You have pain when you urinate or have a bowel movement.  You experience abdominal pain that wakes you in the night.  You have abdominal pain that is worsened or improved by eating food.  You have abdominal pain that is worsened with eating fatty foods. SEEK IMMEDIATE MEDICAL CARE IF:   Your pain does not go away within 2 hours.  You have a fever.  You keep throwing up (vomiting).  Your pain is felt only in portions of the abdomen, such as the right side or the left lower portion of the abdomen.  You pass bloody or black tarry stools. MAKE SURE YOU:  Understand these instructions.   Will watch your condition.   Will get help right away if you are not doing well or get worse.  Document Released: 10/14/2004 Document Revised: 10/25/2012 Document Reviewed: 09/13/2012 [facility] Patient  Information 2014 [place].  Abdominal Pain, Women Abdominal (stomach, pelvic, or belly) pain can be caused by many things. It is important to tell your doctor:  The location of the pain.  Does it come and go or is it present all the time?  Are there things that start the pain (eating certain foods, exercise)?  Are there other symptoms associated with the pain (fever, nausea, vomiting, diarrhea)? All of this is helpful to know when trying to find the cause of the pain. CAUSES   Stomach: virus or bacteria infection, or ulcer.  Intestine: appendicitis (inflamed appendix), regional ileitis (Crohn's disease), ulcerative colitis (inflamed colon), irritable bowel syndrome, diverticulitis (inflamed diverticulum of the colon), or cancer of the stomach or intestine.  Gallbladder disease or stones in the gallbladder.  Kidney disease, kidney stones, or infection.  Pancreas infection or cancer.  Fibromyalgia (pain disorder).  Diseases of the female organs:  Uterus: fibroid (non-cancerous) tumors or infection.  Fallopian tubes: infection or tubal pregnancy.  Ovary: cysts or tumors.  Pelvic adhesions (scar tissue).  Endometriosis (uterus lining tissue growing in the pelvis and on the pelvic organs).  Pelvic congestion syndrome (female organs filling up with blood just before the menstrual period).  Pain with the menstrual period.  Pain with ovulation (producing an egg).  Pain with an IUD (intrauterine device, birth control) in the uterus.  Cancer of the female organs.  Functional pain (pain not caused by a disease, may improve without treatment).  Psychological pain.  Depression. DIAGNOSIS  Your doctor  will decide the seriousness of your pain by doing an examination.  Blood tests.  X-rays.  Ultrasound.  CT scan (computed tomography, special type of X-ray).  MRI (magnetic resonance imaging).  Cultures, for infection.  Barium enema (dye inserted in the large  intestine, to better view it with X-rays).  Colonoscopy (looking in intestine with a lighted tube).  Laparoscopy (minor surgery, looking in abdomen with a lighted tube).  Major abdominal exploratory surgery (looking in abdomen with a large incision). TREATMENT  The treatment will depend on the cause of the pain.   Many cases can be observed and treated at home.  Over-the-counter medicines recommended by your caregiver.  Prescription medicine.  Antibiotics, for infection.  Birth control pills, for painful periods or for ovulation pain.  Hormone treatment, for endometriosis.  Nerve blocking injections.  Physical therapy.  Antidepressants.  Counseling with a psychologist or psychiatrist.  Minor or major surgery. HOME CARE INSTRUCTIONS   Do not take laxatives, unless directed by your caregiver.  Take over-the-counter pain medicine only if ordered by your caregiver. Do not take aspirin because it can cause an upset stomach or bleeding.  Try a clear liquid diet (broth or water) as ordered by your caregiver. Slowly move to a bland diet, as tolerated, if the pain is related to the stomach or intestine.  Have a thermometer and take your temperature several times a day, and record it.  Bed rest and sleep, if it helps the pain.  Avoid sexual intercourse, if it causes pain.  Avoid stressful situations.  Keep your follow-up appointments and tests, as your caregiver orders.  If the pain does not go away with medicine or surgery, you may try:  Acupuncture.  Relaxation exercises (yoga, meditation).  Group therapy.  Counseling. SEEK MEDICAL CARE IF:   You notice certain foods cause stomach pain.  Your home care treatment is not helping your pain.  You need stronger pain medicine.  You want your IUD removed.  You feel faint or lightheaded.  You develop nausea and vomiting.  You develop a rash.  You are having side effects or an allergy to your medicine. SEEK  IMMEDIATE MEDICAL CARE IF:   Your pain does not go away or gets worse.  You have a fever.  Your pain is felt only in portions of the abdomen. The right side could possibly be appendicitis. The left lower portion of the abdomen could be colitis or diverticulitis.  You are passing blood in your stools (bright red or black tarry stools, with or without vomiting).  You have blood in your urine.  You develop chills, with or without a fever.  You pass out. MAKE SURE YOU:   Understand these instructions.  Will watch your condition.  Will get help right away if you are not doing well or get worse. Document Released: 11/01/2006 Document Revised: 03/29/2011 Document Reviewed: 11/21/2008 [facility] Patient Information 2014 [place], [place].

## 2013-03-21 NOTE — ED Provider Notes (Signed)
CSN: [id number]     Arrival date & time 03/21/13  1607 History   First MD Initiated Contact with Patient 03/21/13 1652     Chief Complaint  Patient presents with  . Back Pain   (Consider location/radiation/quality/duration/timing/severity/associated sxs/prior Treatment) Patient is a 38 y.o. female presenting with abdominal pain. The history is provided by the patient.  Abdominal Pain Pain location:  RUQ, L flank and R flank Pain quality: aching   Pain radiates to:  Back Onset quality:  Gradual Duration:  4 days Timing:  Intermittent Progression:  Waxing and waning Chronicity:  New Worsened by:  Eating Associated symptoms: anorexia and nausea   Associated symptoms: no belching, no chest pain, no chills, no constipation, no cough, no diarrhea, no dysuria, no fatigue, no fever, no flatus, no hematemesis, no hematochezia, no hematuria, no melena, no shortness of breath, no sore throat, no vaginal bleeding, no vaginal discharge and no vomiting     Past Medical History  Diagnosis Date  . Hypertension   . Headache(784.0)   . Low back pain   . 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 Tobacco abuse    Past Surgical History  Procedure Laterality Date  . Tubal ligation    . Aneurysm coiling  01/2010    basilar artery  . Hernia repair     Family History  Problem Relation Age of Onset  . Adopted: Yes  . Alcohol abuse Neg Hx   . Cancer Neg Hx   . Early death Neg Hx   . Heart disease Neg Hx   . Hyperlipidemia Neg Hx   . Hypertension Neg Hx   . Stroke Neg Hx    History  Substance Use Topics  . Smoking status: Current Every Day Smoker -- 0.50 packs/day for 15 years    Types: Cigarettes  . Smokeless tobacco: Not on file  . Alcohol Use: Yes     Comment: social   OB History   Grav Para Term Preterm Abortions TAB SAB Ect Mult Living                 Review of Systems  Constitutional: Negative for fever, chills and fatigue.  HENT: Negative for sore throat.   Respiratory:  Negative for cough and shortness of breath.   Cardiovascular: Negative for chest pain.  Gastrointestinal: Positive for nausea, abdominal pain and anorexia. Negative for vomiting, diarrhea, constipation, melena, hematochezia, flatus and hematemesis.  Genitourinary: Negative for dysuria, hematuria, vaginal bleeding and vaginal discharge.  All other systems reviewed and are negative.    Allergies  Amlodipine  Home Medications   Current Outpatient Rx  Name  Route  Sig  Dispense  Refill  . ibuprofen (ADVIL,MOTRIN) 600 MG tablet   Oral   Take 1 tablet (600 mg total) by mouth 3 (three) times daily.   21 tablet   0   . nebivolol (BYSTOLIC) 5 MG tablet   Oral   Take 1 tablet (5 mg total) by mouth daily.   35 tablet   0   . ondansetron (ZOFRAN) 4 MG tablet   Oral   Take 1 tablet (4 mg total) by mouth every 8 (eight) hours as needed for nausea or vomiting.   12 tablet   0   . traMADol (ULTRAM) 50 MG tablet   Oral   Take 1 tablet (50 mg total) by mouth every 8 (eight) hours as needed for pain.   50 tablet   1  BP 144/94  Temp(Src) 98.5 F (36.9 C) (Oral)  Resp 16  SpO2 100%  LMP 03/09/2013 Physical Exam  Nursing note and vitals reviewed. Constitutional: She is oriented to person, place, and time. She appears well-developed and well-nourished. No distress.  HENT:  Head: Normocephalic and atraumatic.  Eyes: Conjunctivae are normal. No scleral icterus.  Neck: Normal range of motion. Neck supple.  Cardiovascular: Normal rate, regular rhythm and normal heart sounds.   Pulmonary/Chest: Effort normal and breath sounds normal. No respiratory distress. She has no wheezes.  Abdominal: Soft. She exhibits no distension and no mass. Bowel sounds are decreased. There is no hepatosplenomegaly. There is tenderness in the right upper quadrant. There is positive Murphy's sign. There is no rigidity, no rebound, no guarding, no CVA tenderness and no tenderness at McBurney's point.   Musculoskeletal: Normal range of motion.  Neurological: She is alert and oriented to person, place, and time.  Skin: Skin is warm and dry. No rash noted.  Psychiatric: She has a normal mood and affect. Her behavior is normal.    ED Course  Procedures (including critical care time) Labs Review Labs Reviewed  POCT URINALYSIS DIP (DEVICE) - Abnormal; Notable for the following:    Leukocytes, UA TRACE (*)    All other components within normal limits  COMPREHENSIVE METABOLIC PANEL  CBC  LIPASE, BLOOD  POCT PREGNANCY, URINE   Imaging Review Dg Abd 2 Views  03/21/2013   CLINICAL DATA:  Infracostal pain. Nausea when eating and drinking. Bilateral flank pain.  EXAM: ABDOMEN - 2 VIEW  COMPARISON:  None.  FINDINGS: The bowel gas pattern is normal. There is no evidence of free air. No radio-opaque calculi or other significant radiographic abnormality is seen. Tubal ligation clips incidentally noted.  IMPRESSION: Negative.   Electronically Signed   By: Dereck Ligas M.D.   On: 03/21/2013 18:10     MDM   1. Rt flank pain    Abdominal/right flank discomfort: CBC, CMP, UA, Lipase all normal. Flat/upright abdominal film normal. UPT negative. Advised patient that while she may still be experiencing some mild biliary colic with meals (??gall bladder sludge), it did not appear that she needs immediate surgical consultation for acute cholecystitis. No evidence of additional acute abdominal process. All results printed and provided to patient. Advised her to stick to a bland diet for next few days using zofran as prescribed for nausea. If symptoms persist, advise she discuss this with PCP and perhaps undergo abdominal U/S. Cautioned if symptoms become suddenly worse, persistent or severe, she should seek re-evaluation at her nearest ER. She is pain and nausea free at time of discharge.   [place], [place] 03/21/13 [phone]

## 2013-03-21 NOTE — ED Notes (Addendum)
Pt  Reports  Pain  r  Side  Of  Back  Radiating     To  Sides     X  4  Days  With  Nausea    -  Symptoms  Worse  After   She  Eats         No  Vomiting   -  She  Is  Sitting  Upright on exam table  No  Acute  Distress     Also  Reports  Sinus  Pressure  Behind  Nose  /  Eyes

## 2013-03-22 NOTE — ED Provider Notes (Signed)
Medical screening examination/treatment/procedure(s) were performed by a resident physician or non-physician practitioner and as the supervising physician I was immediately available for consultation/collaboration.  Evan Corey, MD    Evan S Corey, MD 03/22/13 0750 

## 2013-03-26 ENCOUNTER — Encounter (HOSPITAL_COMMUNITY): Payer: Self-pay | Admitting: Emergency Medicine

## 2013-03-26 ENCOUNTER — Emergency Department (HOSPITAL_COMMUNITY)
Admission: EM | Admit: 2013-03-26 | Discharge: 2013-03-26 | Disposition: A | Discharge status: Home / Self Care | Payer: BC Managed Care – PPO | Attending: Emergency Medicine | Admitting: Emergency Medicine

## 2013-03-26 ENCOUNTER — Emergency Department (HOSPITAL_COMMUNITY): Payer: BC Managed Care – PPO

## 2013-03-26 DIAGNOSIS — I10 Essential (primary) hypertension: Secondary | ICD-10-CM | POA: Insufficient documentation

## 2013-03-26 DIAGNOSIS — F17200 Nicotine dependence, unspecified, uncomplicated: Secondary | ICD-10-CM | POA: Insufficient documentation

## 2013-03-26 DIAGNOSIS — R109 Unspecified abdominal pain: Secondary | ICD-10-CM | POA: Insufficient documentation

## 2013-03-26 DIAGNOSIS — R112 Nausea with vomiting, unspecified: Secondary | ICD-10-CM | POA: Insufficient documentation

## 2013-03-26 DIAGNOSIS — R509 Fever, unspecified: Secondary | ICD-10-CM | POA: Insufficient documentation

## 2013-03-26 DIAGNOSIS — Z9851 Tubal ligation status: Secondary | ICD-10-CM | POA: Insufficient documentation

## 2013-03-26 LAB — URINALYSIS, ROUTINE W REFLEX MICROSCOPIC
BILIRUBIN URINE: NEGATIVE
Glucose, UA: NEGATIVE mg/dL
Hgb urine dipstick: NEGATIVE
Ketones, ur: 15 mg/dL — AB
Leukocytes, UA: NEGATIVE
Nitrite: NEGATIVE
PH: 5.5 (ref 5.0–8.0)
Protein, ur: NEGATIVE mg/dL
SPECIFIC GRAVITY, URINE: 1.017 (ref 1.005–1.030)
Urobilinogen, UA: 0.2 mg/dL (ref 0.0–1.0)

## 2013-03-26 LAB — COMPREHENSIVE METABOLIC PANEL
ALK PHOS: 79 U/L (ref 39–117)
ALT: 13 U/L (ref 0–35)
AST: 16 U/L (ref 0–37)
Albumin: 4.4 g/dL (ref 3.5–5.2)
BUN: 9 mg/dL (ref 6–23)
CHLORIDE: 102 meq/L (ref 96–112)
CO2: 25 mEq/L (ref 19–32)
Calcium: 9.3 mg/dL (ref 8.4–10.5)
Creatinine, Ser: 0.68 mg/dL (ref 0.50–1.10)
GFR calc Af Amer: >90 mL/min (ref >90)
GFR calc non Af Amer: >90 mL/min (ref >90)
GLUCOSE: 82 mg/dL (ref 70–99)
POTASSIUM: 3.6 meq/L — AB (ref 3.7–5.3)
Sodium: 141 mEq/L (ref 137–147)
TOTAL PROTEIN: 7.5 g/dL (ref 6.0–8.3)
Total Bilirubin: 0.3 mg/dL (ref 0.3–1.2)

## 2013-03-26 LAB — CBC WITH DIFFERENTIAL/PLATELET
BASOS PCT: 0 % (ref 0–1)
Basophils Absolute: 0 10*3/uL (ref 0.0–0.1)
Eosinophils Absolute: 0.1 10*3/uL (ref 0.0–0.7)
Eosinophils Relative: 1 % (ref 0–5)
HCT: 37.9 % (ref 36.0–46.0)
HEMOGLOBIN: 13.6 g/dL (ref 12.0–15.0)
LYMPHS ABS: 2.4 10*3/uL (ref 0.7–4.0)
Lymphocytes Relative: 18 % (ref 12–46)
MCH: 32 pg (ref 26.0–34.0)
MCHC: 35.9 g/dL (ref 30.0–36.0)
MCV: 89.2 fL (ref 78.0–100.0)
Monocytes Absolute: 1 10*3/uL (ref 0.1–1.0)
Monocytes Relative: 7 % (ref 3–12)
NEUTROS ABS: 9.8 10*3/uL — AB (ref 1.7–7.7)
NEUTROS PCT: 74 % (ref 43–77)
PLATELETS: 284 10*3/uL (ref 150–400)
RBC: 4.25 MIL/uL (ref 3.87–5.11)
RDW: 13.1 % (ref 11.5–15.5)
WBC: 13.3 10*3/uL — ABNORMAL HIGH (ref 4.0–10.5)

## 2013-03-26 LAB — LIPASE, BLOOD: Lipase: 23 U/L (ref 11–59)

## 2013-03-26 MED ORDER — FAMOTIDINE 20 MG PO TABS
20.0000 mg | ORAL_TABLET | Freq: Once | ORAL | Status: AC
  Administered 2013-03-26: 20 mg via ORAL
  Filled 2013-03-26: qty 1

## 2013-03-26 MED ORDER — PANTOPRAZOLE SODIUM 20 MG PO TBEC: 20.0000 mg | DELAYED_RELEASE_TABLET | Freq: Every day | ORAL | Status: DC

## 2013-03-26 MED ORDER — ONDANSETRON HCL 4 MG PO TABS: 4.0000 mg | ORAL_TABLET | Freq: Four times a day (QID) | ORAL | Status: DC

## 2013-03-26 NOTE — ED Provider Notes (Signed)
Medical screening examination/treatment/procedure(s) were conducted as a shared visit with non-physician practitioner(s) or resident and myself. I personally evaluated the patient during the encounter and agree with the findings and plan unless otherwise indicated.  I have personally reviewed any xrays and/ or EKG's with the provider and I agree with interpretation.  Worsening RUQ pain and nausea, worse after eating. Burning sensation epigastric. Recent NSAID use, no known ulcers, no bleeding. Exam RUQ and epig pain mild, no guarding, no jaundice, non toxic appearing, mild dry mm.  Bedside US showed sludge, no wall thickening, formal ordered for further detail.  EMERGENCY DEPARTMENT BILIARY ULTRASOUND INTERPRETATION  "Study: Limited Abdominal Ultrasound of the gallbladder and common bile duct."  INDICATIONS: RUQ pain and Nausea  Indication: Multiple views of the gallbladder and common bile duct were obtained in real-time with a Multi-frequency probe."  PERFORMED BY: Myself  IMAGES ARCHIVED?: Yes  FINDINGS: Gallbladder wall normal in thickness and Sonographic Murphy's sign absent  LIMITATIONS: Bowel Gas and Abdominal pain  INTERPRETATION: Sludge, no fluid or wall thickening  Close fup outpt.  Labs Reviewed  CBC WITH DIFFERENTIAL - Abnormal; Notable for the following:    WBC 13.3 (*)    Neutro Abs 9.8 (*)    All other components within normal limits  COMPREHENSIVE METABOLIC PANEL - Abnormal; Notable for the following:    Potassium 3.6 (*)    All other components within normal limits  URINALYSIS, ROUTINE W REFLEX MICROSCOPIC - Abnormal; Notable for the following:    Ketones, ur 15 (*)    All other components within normal limits  LIPASE, BLOOD    Abdominal pain   Mariea Clonts, MD 03/26/13 [phone]

## 2013-03-26 NOTE — ED Notes (Signed)
States had an episode of pain under right shoulder blade yesterday and back pain, woke up today with "throat feeling like there is razor blades in my throat" vomited x 1 this am, no diarrhea,  Also states that "everything I ate yesterday I could feel moving down through throat, stomach, and all the way down" pain under ribs hurts worse after eating. Also had a fever this am.

## 2013-03-26 NOTE — ED Notes (Signed)
Per pt sts since being seen at the [facility] the other day she has had worsening upper abdominal pain. sts when she eats she feels like the food is sitting there and she can feel it pass all the way through her GI tract. sts pain in her abdomen and a burning feeling in her throat. sts nausea every time she eats. sts vomited one time.

## 2013-03-26 NOTE — Discharge Instructions (Signed)
Abdominal Pain, Adult °Many things can cause abdominal pain. Usually, abdominal pain is not caused by a disease and will improve without treatment. It can often be observed and treated at home. Your health care provider will do a physical exam and possibly order blood tests and X-rays to help determine the seriousness of your pain. However, in many cases, more time must pass before a clear cause of the pain can be found. Before that point, your health care provider may not know if you need more testing or further treatment. °HOME CARE INSTRUCTIONS  °Monitor your abdominal pain for any changes. The following actions may help to alleviate any discomfort you are experiencing: °· Only take over-the-counter or prescription medicines as directed by your health care provider. °· Do not take laxatives unless directed to do so by your health care provider. °· Try a clear liquid diet (broth, tea, or water) as directed by your health care provider. Slowly move to a bland diet as tolerated. °SEEK MEDICAL CARE IF: °· You have unexplained abdominal pain. °· You have abdominal pain associated with nausea or diarrhea. °· You have pain when you urinate or have a bowel movement. °· You experience abdominal pain that wakes you in the night. °· You have abdominal pain that is worsened or improved by eating food. °· You have abdominal pain that is worsened with eating fatty foods. °SEEK IMMEDIATE MEDICAL CARE IF:  °· Your pain does not go away within 2 hours. °· You have a fever. °· You keep throwing up (vomiting). °· Your pain is felt only in portions of the abdomen, such as the right side or the left lower portion of the abdomen. °· You pass bloody or black tarry stools. °MAKE SURE YOU: °· Understand these instructions.   °· Will watch your condition.   °· Will get help right away if you are not doing well or get worse.   °Document Released: 10/14/2004 Document Revised: 10/25/2012 Document Reviewed: 09/13/2012 °[employer]® Patient  Information ©2014 [employer]. ° °

## 2013-03-26 NOTE — ED Provider Notes (Signed)
CSN: [id number]     Arrival date & time 03/26/13  1059 History   First MD Initiated Contact with Patient 03/26/13 1309     Chief Complaint  Patient presents with  . Abdominal Pain   HPI Comments: 38 yo F Hx of HTN, Aneurysm s/p coiling presents with CC of abdominal and flank pain.  Pt states symptoms began approximately 2 weeks ago.  She c/o of RUQ abdominal pain, R and L flank pain, worse with eating, with associated nausea, and one episode of vomiting today.  She also had one day of fever today high of 101, which has now resolved.  She denies chills, CP, SOB, cough, diarrhea, constipation, dysuria, hematuria, myalgias, rash.  She was seen at [facility] last Thursday for similar complaints.  She had labs at that time which were WNL, and a KUB which was WNL as well.  Pt was given Rx for Zofran, which she has used without much benefit.  Symptoms have still not resolved, and this morning had some burning in her throat, and pt came to ED for further evaluation.    Patient is a 38 y.o. female presenting with abdominal pain. The history is provided by the patient. No language interpreter was used.  Abdominal Pain Pain location:  R flank, L flank and RUQ Pain quality: burning and sharp   Pain radiates to:  L flank, R flank and RUQ Pain severity:  Moderate Onset quality:  Gradual Duration:  2 weeks Timing:  Constant Progression:  Worsening Chronicity:  New Context: eating   Context: not alcohol use, not awakening from sleep, not diet changes, not laxative use, not medication withdrawal, not previous surgeries, not recent illness, not recent sexual activity, not recent travel, not retching, not sick contacts, not suspicious food intake and not trauma   Relieved by:  Nothing Worsened by:  Eating Ineffective treatments:  OTC medications Associated symptoms: fever, nausea and vomiting   Associated symptoms: no anorexia, no belching, no chest pain, no chills, no constipation, no cough, no diarrhea, no dysuria,  no fatigue, no flatus, no hematemesis, no hematochezia, no hematuria, no melena, no shortness of breath, no sore throat, no vaginal bleeding and no vaginal discharge   Fever:    Duration:  1 day   Timing:  Intermittent   Max temp PTA (F):  101   Temp source:  Oral   Progression:  Resolved Nausea:    Severity:  Mild   Onset quality:  Gradual   Duration:  2 weeks   Timing:  Intermittent   Progression:  Waxing and waning Vomiting:    Quality:  Stomach contents   Number of occurrences:  1   Severity:  Mild   Duration:  1 day   Progression:  Resolved Risk factors: no alcohol abuse, no aspirin use, not elderly, has not had multiple surgeries, no NSAID use, not obese, not pregnant and no recent hospitalization     Past Medical History  Diagnosis Date  . Hypertension   . Headache(784.0)   . Low back pain   . 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 Tobacco abuse    Past Surgical History  Procedure Laterality Date  . Tubal ligation    . Aneurysm coiling  01/2010    basilar artery  . Hernia repair     Family History  Problem Relation Age of Onset  . Adopted: Yes  . Alcohol abuse Neg Hx   . Cancer Neg Hx   .  Early death Neg Hx   . Heart disease Neg Hx   . Hyperlipidemia Neg Hx   . Hypertension Neg Hx   . Stroke Neg Hx    History  Substance Use Topics  . Smoking status: Current Every Day Smoker -- 0.50 packs/day for 15 years    Types: Cigarettes  . Smokeless tobacco: Not on file  . Alcohol Use: Yes     Comment: social   OB History   Grav Para Term Preterm Abortions TAB SAB Ect Mult Living                 Review of Systems  Constitutional: Positive for fever. Negative for chills and fatigue.  HENT: Negative for sore throat.   Respiratory: Negative for cough and shortness of breath.   Cardiovascular: Negative for chest pain.  Gastrointestinal: Positive for nausea, vomiting and abdominal pain. Negative for diarrhea, constipation, melena, hematochezia, anorexia,  flatus and hematemesis.  Genitourinary: Negative for dysuria, hematuria, vaginal bleeding and vaginal discharge.  Musculoskeletal: Negative for myalgias.  Skin: Negative for rash.  Neurological: Negative for dizziness, weakness, light-headedness, numbness and headaches.  Hematological: Negative for adenopathy. Does not bruise/bleed easily.  All other systems reviewed and are negative.      Allergies  Amlodipine  Home Medications   Current Outpatient Rx  Name  Route  Sig  Dispense  Refill  . acetaminophen (TYLENOL) 500 MG tablet   Oral   Take 500 mg by mouth every 6 (six) hours as needed for headache.         . nebivolol (BYSTOLIC) 5 MG tablet   Oral   Take 5 mg by mouth at bedtime.         . ondansetron (ZOFRAN) 4 MG tablet   Oral   Take 1 tablet (4 mg total) by mouth every 8 (eight) hours as needed for nausea or vomiting.   12 tablet   0    BP 135/94  Pulse 81  Temp(Src) 98.1 F (36.7 C) (Oral)  Resp 18  SpO2 100%  LMP 03/09/2013 Physical Exam  Nursing note and vitals reviewed. Constitutional: She is oriented to person, place, and time. She appears well-developed and well-nourished.  HENT:  Head: Normocephalic and atraumatic.  Right Ear: External ear normal.  Left Ear: External ear normal.  Nose: Nose normal.  Mouth/Throat: Oropharynx is clear and moist.  Eyes: Conjunctivae and EOM are normal. Pupils are equal, round, and reactive to light.  Neck: Normal range of motion. Neck supple.  Cardiovascular: Normal rate, regular rhythm, normal heart sounds and intact distal pulses.   Pulmonary/Chest: Effort normal and breath sounds normal. No respiratory distress. She has no wheezes. She has no rales. She exhibits no tenderness.  Abdominal: Soft. Bowel sounds are normal. She exhibits no distension and no mass. There is tenderness. There is no rebound and no guarding.  RUQ, central abdominal TTP.  Soft, nondistended, no guarding, no rebound.    Musculoskeletal:  Normal range of motion.  No CVA TTP.  Neurological: She is alert and oriented to person, place, and time.  Skin: Skin is warm and dry.    ED Course  Procedures (including critical care time) Labs Review Labs Reviewed  CBC WITH DIFFERENTIAL - Abnormal; Notable for the following:    WBC 13.3 (*)    Neutro Abs 9.8 (*)    All other components within normal limits  COMPREHENSIVE METABOLIC PANEL - Abnormal; Notable for the following:    Potassium 3.6 (*)  All other components within normal limits  URINALYSIS, ROUTINE W REFLEX MICROSCOPIC - Abnormal; Notable for the following:    Ketones, ur 15 (*)    All other components within normal limits  LIPASE, BLOOD   Imaging Review US Abdomen Limited  03/26/2013   CLINICAL DATA:  Abdominal pain.  EXAM: US ABDOMEN LIMITED - RIGHT UPPER QUADRANT  COMPARISON:  No priors.  FINDINGS: Gallbladder:  No gallstones or wall thickening visualized (wall thickness is 1.9 mm). No sonographic Murphy sign noted.  Common bile duct:  Diameter: Slightly prominent measuring up to 6.7 mm in the porta hepatis. No definite ductal stone identified.  Liver:  No focal lesion identified. Within normal limits in parenchymal echogenicity. No intrahepatic biliary ductal dilatation.  IMPRESSION: 1. Common bile duct is slightly prominent measuring 6.7 mm in the porta hepatis (upper limits of normal is 6 mm). However, there are no ductal stones, no gallstones, and no intra hepatic biliary ductal dilatation. This is of uncertain clinical significance. Correlation with liver function tests is recommended. In the setting of normal liver function tests, this would be presumed to be physiologic. If there are abnormalities suggestive of biliary tract obstruction, further evaluation with MRI/MRCP may be appropriate.   Electronically Signed   By: Vinnie Langton M.D.   On: 03/26/2013 14:35     EKG Interpretation None      MDM   Final diagnoses:  None   38 yo F Hx of HTN, Aneurysm  s/p coiling presents with CC of abdominal and flank pain.  Filed Vitals:   03/26/13 1341  BP: 120/69  Pulse: 73  Temp:   Resp: 18   Physical exam as above.  Pt afebrile, and other VS WNL currently.  Pt with RUQ, and central abdominal TTP.  H&P suspicious for cholelithiasis/cholecystitis.  Bedside US shows sludge.  Will get repeat Labs (CBC, CMP, Lipase, UA) here and obtain formal US RUQ to eval gall bladder.  Pt does not want any pain meds or nausea medications right now.  Will reeval.   Labs show mild leukocytosis, with left shift.  CMP, Lipase, UA are WNL.  Pregnancy negative on 3/4 did not repeat today.  US shows mild prominent CBD, without ductal stones, gallstones, or heptatic biliary ductal dilatation.  No wall thickening, or cholecystic fluid.    Diagnosis abdominal pain, questionable origin with gallbladder sludge, vs. Gastritis/GERD with burning esophageal sensation.  Pt given Pepcid with some improvement.    Pt to be d/c home in good condition.  Encouraged to continue supportive care.  Rx PPI, zofran.  F/u with PCP, surgery within 1 week for further evaluation.  Return precautions given.  Pt understands and agrees with plan.  I have discussed pt's care plan with Dr. Reather Converse.  Sinda Du, MD            Sinda Du, MD 03/26/13 [phone]

## 2013-06-25 ENCOUNTER — Ambulatory Visit: Payer: BC Managed Care – PPO | Admitting: Internal Medicine

## 2013-06-25 DIAGNOSIS — Z0289 Encounter for other administrative examinations: Secondary | ICD-10-CM

## 2013-07-06 ENCOUNTER — Other Ambulatory Visit: Payer: Self-pay | Admitting: Obstetrics & Gynecology

## 2013-07-06 ENCOUNTER — Other Ambulatory Visit (HOSPITAL_COMMUNITY)
Admission: RE | Admit: 2013-07-06 | Discharge: 2013-07-06 | Disposition: A | Discharge status: Home / Self Care | Payer: BC Managed Care – PPO | Source: Ambulatory Visit | Attending: Obstetrics & Gynecology | Admitting: Obstetrics & Gynecology

## 2013-07-06 DIAGNOSIS — Z01419 Encounter for gynecological examination (general) (routine) without abnormal findings: Secondary | ICD-10-CM | POA: Insufficient documentation

## 2013-07-06 DIAGNOSIS — Z1151 Encounter for screening for human papillomavirus (HPV): Secondary | ICD-10-CM | POA: Insufficient documentation

## 2013-07-10 LAB — CYTOLOGY - PAP

## 2013-09-12 ENCOUNTER — Telehealth (HOSPITAL_COMMUNITY): Payer: Self-pay | Admitting: Interventional Radiology

## 2013-09-12 NOTE — Telephone Encounter (Signed)
Pt called to give me her new phone numbers. I updated the system with her new number and her husband's new number. She also wanted to know if she should be seeing a neurologist? I told her I would ask Deveshwar if she should be and if so who he would refer her to? Gave note to [facility] 09/12/13. JM

## 2013-09-17 ENCOUNTER — Other Ambulatory Visit (HOSPITAL_COMMUNITY): Payer: Self-pay | Admitting: Interventional Radiology

## 2013-09-17 ENCOUNTER — Telehealth (HOSPITAL_COMMUNITY): Payer: Self-pay | Admitting: Interventional Radiology

## 2013-09-17 DIAGNOSIS — I729 Aneurysm of unspecified site: Secondary | ICD-10-CM

## 2013-09-17 NOTE — Telephone Encounter (Signed)
Called pt and scheduled consult JM

## 2013-09-26 ENCOUNTER — Ambulatory Visit (HOSPITAL_COMMUNITY)
Admission: RE | Admit: 2013-09-26 | Discharge: 2013-09-26 | Disposition: A | Discharge status: Home / Self Care | Payer: BC Managed Care – PPO | Source: Ambulatory Visit | Attending: Interventional Radiology | Admitting: Interventional Radiology

## 2013-09-26 DIAGNOSIS — I729 Aneurysm of unspecified site: Secondary | ICD-10-CM

## 2013-10-04 ENCOUNTER — Encounter: Payer: Self-pay | Admitting: Internal Medicine

## 2013-10-04 ENCOUNTER — Ambulatory Visit (INDEPENDENT_AMBULATORY_CARE_PROVIDER_SITE_OTHER): Payer: BC Managed Care – PPO | Admitting: Internal Medicine

## 2013-10-04 VITALS — BP 124/78 | HR 79 | Temp 98.3°F | Resp 16 | Ht 62.0 in | Wt 144.0 lb

## 2013-10-04 DIAGNOSIS — J309 Allergic rhinitis, unspecified: Secondary | ICD-10-CM

## 2013-10-04 DIAGNOSIS — I10 Essential (primary) hypertension: Secondary | ICD-10-CM

## 2013-10-04 MED ORDER — MONTELUKAST SODIUM 10 MG PO TABS: 10.0000 mg | ORAL_TABLET | Freq: Every day | ORAL | Status: DC

## 2013-10-04 MED ORDER — BECLOMETHASONE DIPROPIONATE 80 MCG/ACT NA AERS: 4.0000 | INHALATION_SPRAY | Freq: Every day | NASAL | Status: DC

## 2013-10-04 NOTE — Assessment & Plan Note (Signed)
She is taking claritin and will cont that I have asked her to start Qnasl and singulair as well

## 2013-10-04 NOTE — Patient Instructions (Signed)

## 2013-10-04 NOTE — Progress Notes (Signed)
Pre visit review using our clinic review tool, if applicable. No additional management support is needed unless otherwise documented below in the visit note. 

## 2013-10-04 NOTE — Progress Notes (Signed)
Subjective:    Patient ID: Kathy Chaney, female    DOB: Aug 14, 1975, 38 y.o.   MRN: [id number]  HPI Comments: She complains of a one month history of nasal congestion and post-nasal drip.     Review of Systems  Constitutional: Negative.  Negative for fever, chills, diaphoresis, appetite change and fatigue.  HENT: Positive for congestion, postnasal drip and rhinorrhea. Negative for ear pain, facial swelling, nosebleeds, sinus pressure, sneezing, sore throat, tinnitus, trouble swallowing and voice change.   Eyes: Negative.   Respiratory: Negative.  Negative for cough, choking, chest tightness and shortness of breath.   Cardiovascular: Negative.  Negative for chest pain, palpitations and leg swelling.  Gastrointestinal: Negative.  Negative for nausea, vomiting, abdominal pain, diarrhea, constipation and blood in stool.  Endocrine: Negative.   Genitourinary: Negative.   Musculoskeletal: Negative.   Skin: Negative.  Negative for rash.  Allergic/Immunologic: Negative.   Neurological: Negative.   Hematological: Negative.  Negative for adenopathy. Does not bruise/bleed easily.  Psychiatric/Behavioral: Negative.        Objective:   Physical Exam  Vitals reviewed. Constitutional: She is oriented to person, place, and time. She appears well-developed and well-nourished. No distress.  HENT:  Head: Normocephalic and atraumatic.  Right Ear: Hearing, tympanic membrane, external ear and ear canal normal.  Left Ear: Hearing, tympanic membrane, external ear and ear canal normal.  Nose: Mucosal edema and rhinorrhea present. No nose lacerations, sinus tenderness, nasal deformity, septal deviation or nasal septal hematoma. No epistaxis.  No foreign bodies. Right sinus exhibits no maxillary sinus tenderness and no frontal sinus tenderness. Left sinus exhibits no maxillary sinus tenderness and no frontal sinus tenderness.  Mouth/Throat: Oropharynx is clear and moist and mucous membranes are normal.  Mucous membranes are not pale, not dry and not cyanotic. No oral lesions. No trismus in the jaw. No uvula swelling. No oropharyngeal exudate, posterior oropharyngeal edema, posterior oropharyngeal erythema or tonsillar abscesses.  Eyes: Conjunctivae are normal. Right eye exhibits no discharge. Left eye exhibits no discharge. No scleral icterus.  Neck: Normal range of motion. Neck supple. No JVD present. No tracheal deviation present. No thyromegaly present.  Cardiovascular: Normal rate, regular rhythm, normal heart sounds and intact distal pulses.  Exam reveals no gallop and no friction rub.   No murmur heard. Pulmonary/Chest: Effort normal and breath sounds normal. No stridor. No respiratory distress. She has no wheezes. She has no rales. She exhibits no tenderness.  Abdominal: Soft. Bowel sounds are normal. She exhibits no distension and no mass. There is no tenderness. There is no rebound and no guarding.  Musculoskeletal: Normal range of motion. She exhibits no edema and no tenderness.  Lymphadenopathy:    She has no cervical adenopathy.  Neurological: She is oriented to person, place, and time.  Skin: Skin is warm and dry. No rash noted. She is not diaphoretic. No erythema. No pallor.     Lab Results  Component Value Date   WBC 13.3* 03/26/2013   HGB 13.6 03/26/2013   HCT 37.9 03/26/2013   PLT 284 03/26/2013   GLUCOSE 82 03/26/2013   CHOL  Value: 129        ATP III CLASSIFICATION:  <200     mg/dL   Desirable  200-239  mg/dL   Borderline High  >=240    mg/dL   High        02/05/2010   TRIG 20 02/05/2010   HDL 47 02/05/2010   LDLCALC  Value: 78  Total Cholesterol/HDL:CHD Risk Coronary Heart Disease Risk Table                     Men   Women  1/2 Average Risk   3.4   3.3  Average Risk       5.0   4.4  2 X Average Risk   9.6   7.1  3 X Average Risk  23.4   11.0        Use the calculated Patient Ratio above and the CHD Risk Table to determine the patient's CHD Risk.        ATP III CLASSIFICATION  (LDL):  <100     mg/dL   Optimal  100-129  mg/dL   Near or Above                    Optimal  130-159  mg/dL   Borderline  160-189  mg/dL   High  >190     mg/dL   Very High 02/05/2010   ALT 13 03/26/2013   AST 16 03/26/2013   NA 141 03/26/2013   K 3.6* 03/26/2013   CL 102 03/26/2013   CREATININE 0.68 03/26/2013   BUN 9 03/26/2013   CO2 25 03/26/2013   TSH 0.58 10/20/2012   INR 0.96 11/06/2010   HGBA1C 5.6 10/20/2012  .lastfp      Assessment & Plan:

## 2013-10-04 NOTE — Assessment & Plan Note (Signed)
Her BP is well controlled 

## 2014-01-28 ENCOUNTER — Encounter (HOSPITAL_COMMUNITY): Payer: Self-pay | Admitting: Emergency Medicine

## 2014-01-28 ENCOUNTER — Emergency Department (HOSPITAL_COMMUNITY)
Admission: EM | Admit: 2014-01-28 | Discharge: 2014-01-28 | Disposition: A | Discharge status: Home / Self Care | Payer: BLUE CROSS/BLUE SHIELD | Source: Home / Self Care | Attending: Family Medicine | Admitting: Family Medicine

## 2014-01-28 DIAGNOSIS — N390 Urinary tract infection, site not specified: Secondary | ICD-10-CM

## 2014-01-28 LAB — POCT URINALYSIS DIP (DEVICE)
GLUCOSE, UA: 100 mg/dL — AB
NITRITE: POSITIVE — AB
Specific Gravity, Urine: 1.02 (ref 1.005–1.030)
UROBILINOGEN UA: 4 mg/dL — AB (ref 0.0–1.0)
pH: 6 (ref 5.0–8.0)

## 2014-01-28 MED ORDER — ACETAMINOPHEN 325 MG PO TABS
ORAL_TABLET | ORAL | Status: AC
  Filled 2014-01-28: qty 3

## 2014-01-28 MED ORDER — ACETAMINOPHEN 325 MG PO TABS
975.0000 mg | ORAL_TABLET | Freq: Once | ORAL | Status: AC
  Administered 2014-01-28: 975 mg via ORAL

## 2014-01-28 MED ORDER — CEPHALEXIN 500 MG PO CAPS: 500.0000 mg | ORAL_CAPSULE | Freq: Two times a day (BID) | ORAL | Status: DC

## 2014-01-28 NOTE — ED Provider Notes (Signed)
Kathy Chaney is a 39 y.o. female who presents to Urgent Care today for one day of urinary frequency urgency dysuria and flank pain bilaterally. Symptoms are consistent with UTI. Patient has tried AZO which has not helped much. No fevers or chills vomiting or diarrhea.   Past Medical History  Diagnosis Date  . Hypertension   . Headache(784.0)   . Low back pain   . 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 Tobacco abuse    Past Surgical History  Procedure Laterality Date  . Tubal ligation    . Aneurysm coiling  01/2010    basilar artery  . Hernia repair     History  Substance Use Topics  . Smoking status: Current Every Day Smoker -- 0.50 packs/day for 15 years    Types: Cigarettes  . Smokeless tobacco: Not on file  . Alcohol Use: Yes     Comment: social   ROS as above Medications: Current Facility-Administered Medications  Medication Dose Route Frequency Provider Last Rate Last Dose  . acetaminophen (TYLENOL) tablet 975 mg  975 mg Oral Once Gregor Hams, MD       Current Outpatient Prescriptions  Medication Sig Dispense Refill  . acetaminophen (TYLENOL) 500 MG tablet Take 500 mg by mouth every 6 (six) hours as needed for headache.    . Beclomethasone Dipropionate (QNASL) 80 MCG/ACT AERS Place 4 puffs into the nose daily. 8.7 g 11  . montelukast (SINGULAIR) 10 MG tablet Take 1 tablet (10 mg total) by mouth at bedtime. 30 tablet 11  . nebivolol (BYSTOLIC) 5 MG tablet Take 5 mg by mouth at bedtime.    . cephALEXin (KEFLEX) 500 MG capsule Take 1 capsule (500 mg total) by mouth 2 (two) times daily. 21 capsule 0   Allergies  Allergen Reactions  . Amlodipine     cough     Exam:  BP 132/88 mmHg  Pulse 77  Temp(Src) 98.2 F (36.8 C) (Oral)  Resp 18  SpO2 100%  LMP 01/10/2014 Gen: Well NAD HEENT: EOMI,  MMM Lungs: Normal work of breathing. CTABL Heart: RRR no MRG Abd: NABS, Soft. Nondistended, Nontender no CV angle tenderness to percussion Exts: Brisk capillary  refill, warm and well perfused.   Results for orders placed or performed during the hospital encounter of 01/28/14 (from the past 24 hour(s))  POCT urinalysis dip (device)     Status: Abnormal   Collection Time: 01/28/14  6:06 PM  Result Value Ref Range   Glucose, UA 100 (A) NEGATIVE mg/dL   Bilirubin Urine SMALL (A) NEGATIVE   Ketones, ur TRACE (A) NEGATIVE mg/dL   Specific Gravity, Urine 1.020 1.005 - 1.030   Hgb urine dipstick LARGE (A) NEGATIVE   pH 6.0 5.0 - 8.0   Protein, ur >=300 (A) NEGATIVE mg/dL   Urobilinogen, UA 4.0 (H) 0.0 - 1.0 mg/dL   Nitrite POSITIVE (A) NEGATIVE   Leukocytes, UA LARGE (A) NEGATIVE   No results found.  Assessment and Plan: 39 y.o. female with UTI. Culture pending. Urinalysis inaccurate due to AZO. Treat with Keflex  Discussed warning signs or symptoms. Please see discharge instructions. Patient expresses understanding.     Gregor Hams, MD 01/28/14 [phone]

## 2014-01-28 NOTE — Discharge Instructions (Signed)
Thank you for coming in today. If your belly pain worsens, or you have high fever, bad vomiting, blood in your stool or black tarry stool go to the Emergency Room.   Urinary Tract Infection Urinary tract infections (UTIs) can develop anywhere along your urinary tract. Your urinary tract is your body's drainage system for removing wastes and extra water. Your urinary tract includes two kidneys, two ureters, a bladder, and a urethra. Your kidneys are a pair of bean-shaped organs. Each kidney is about the size of your fist. They are located below your ribs, one on each side of your spine. CAUSES Infections are caused by microbes, which are microscopic organisms, including fungi, viruses, and bacteria. These organisms are so small that they can only be seen through a microscope. Bacteria are the microbes that most commonly cause UTIs. SYMPTOMS  Symptoms of UTIs may vary by age and gender of the patient and by the location of the infection. Symptoms in young women typically include a frequent and intense urge to urinate and a painful, burning feeling in the bladder or urethra during urination. Older women and men are more likely to be tired, shaky, and weak and have muscle aches and abdominal pain. A fever may mean the infection is in your kidneys. Other symptoms of a kidney infection include pain in your back or sides below the ribs, nausea, and vomiting. DIAGNOSIS To diagnose a UTI, your caregiver will ask you about your symptoms. Your caregiver also will ask to provide a urine sample. The urine sample will be tested for bacteria and white blood cells. White blood cells are made by your body to help fight infection. TREATMENT  Typically, UTIs can be treated with medication. Because most UTIs are caused by a bacterial infection, they usually can be treated with the use of antibiotics. The choice of antibiotic and length of treatment depend on your symptoms and the type of bacteria causing your  infection. HOME CARE INSTRUCTIONS  If you were prescribed antibiotics, take them exactly as your caregiver instructs you. Finish the medication even if you feel better after you have only taken some of the medication.  Drink enough water and fluids to keep your urine clear or pale yellow.  Avoid caffeine, tea, and carbonated beverages. They tend to irritate your bladder.  Empty your bladder often. Avoid holding urine for long periods of time.  Empty your bladder before and after sexual intercourse.  After a bowel movement, women should cleanse from front to back. Use each tissue only once. SEEK MEDICAL CARE IF:   You have back pain.  You develop a fever.  Your symptoms do not begin to resolve within 3 days. SEEK IMMEDIATE MEDICAL CARE IF:   You have severe back pain or lower abdominal pain.  You develop chills.  You have nausea or vomiting.  You have continued burning or discomfort with urination. MAKE SURE YOU:   Understand these instructions.  Will watch your condition.  Will get help right away if you are not doing well or get worse. Document Released: 10/14/2004 Document Revised: 07/06/2011 Document Reviewed: 02/12/2011 Exit[employer] Patient Information 2015 [employer]. This information is not intended to replace advice given to you by your health care provider. Make sure you discuss any questions you have with your health care provider.  

## 2014-01-28 NOTE — ED Notes (Signed)
Pt states that she has had burning and side pain with urination.

## 2014-01-30 LAB — URINE CULTURE
Colony Count: >=100000
SPECIAL REQUESTS: NORMAL

## 2014-01-31 ENCOUNTER — Other Ambulatory Visit: Payer: Self-pay | Admitting: Physician Assistant

## 2014-01-31 ENCOUNTER — Ambulatory Visit
Admission: RE | Admit: 2014-01-31 | Discharge: 2014-01-31 | Disposition: A | Discharge status: Home / Self Care | Payer: BLUE CROSS/BLUE SHIELD | Source: Ambulatory Visit | Attending: Physician Assistant | Admitting: Physician Assistant

## 2014-01-31 DIAGNOSIS — M545 Low back pain: Secondary | ICD-10-CM

## 2014-02-02 NOTE — ED Notes (Signed)
Urine culture final 1/13: >100,000 colonies E. Coli.  Pt. adequately treated with Keflex. Kathy Chaney 02/02/2014

## 2014-03-16 ENCOUNTER — Emergency Department (HOSPITAL_COMMUNITY)
Admission: EM | Admit: 2014-03-16 | Discharge: 2014-03-17 | Disposition: A | Discharge status: Home / Self Care | Payer: BLUE CROSS/BLUE SHIELD | Attending: Emergency Medicine | Admitting: Emergency Medicine

## 2014-03-16 ENCOUNTER — Encounter (HOSPITAL_COMMUNITY): Payer: Self-pay | Admitting: Emergency Medicine

## 2014-03-16 ENCOUNTER — Emergency Department (HOSPITAL_COMMUNITY): Payer: BLUE CROSS/BLUE SHIELD

## 2014-03-16 DIAGNOSIS — Y998 Other external cause status: Secondary | ICD-10-CM | POA: Diagnosis not present

## 2014-03-16 DIAGNOSIS — Z7951 Long term (current) use of inhaled steroids: Secondary | ICD-10-CM | POA: Diagnosis not present

## 2014-03-16 DIAGNOSIS — Z720 Tobacco use: Secondary | ICD-10-CM | POA: Diagnosis not present

## 2014-03-16 DIAGNOSIS — I10 Essential (primary) hypertension: Secondary | ICD-10-CM | POA: Diagnosis not present

## 2014-03-16 DIAGNOSIS — Y9389 Activity, other specified: Secondary | ICD-10-CM | POA: Insufficient documentation

## 2014-03-16 DIAGNOSIS — S82292A Other fracture of shaft of left tibia, initial encounter for closed fracture: Secondary | ICD-10-CM | POA: Insufficient documentation

## 2014-03-16 DIAGNOSIS — Y9289 Other specified places as the place of occurrence of the external cause: Secondary | ICD-10-CM | POA: Insufficient documentation

## 2014-03-16 DIAGNOSIS — W010XXA Fall on same level from slipping, tripping and stumbling without subsequent striking against object, initial encounter: Secondary | ICD-10-CM | POA: Diagnosis not present

## 2014-03-16 DIAGNOSIS — Z792 Long term (current) use of antibiotics: Secondary | ICD-10-CM | POA: Diagnosis not present

## 2014-03-16 DIAGNOSIS — S99912A Unspecified injury of left ankle, initial encounter: Secondary | ICD-10-CM | POA: Diagnosis present

## 2014-03-16 DIAGNOSIS — S82202A Unspecified fracture of shaft of left tibia, initial encounter for closed fracture: Secondary | ICD-10-CM

## 2014-03-16 NOTE — ED Notes (Signed)
Pt reports she tripped over a small step on roller blades and heard a crack in her L ankle. Pt sts pain radiates up leg when she moves. PMS intact.

## 2014-03-16 NOTE — ED Provider Notes (Signed)
CSN: [id number]     Arrival date & time 03/16/14  2237 History  This chart was scribed for Delos Haring, PA-C with Janice Norrie, MD by Edison Simon, ED Scribe. This patient was seen in room TR07C/TR07C and the patient's care was started at 11:44 PM.    Chief Complaint  Patient presents with  . Ankle Pain   HPI  HPI Comments: Kathy Chaney is a 39 y.o. female who presents to the Emergency Department complaining of pain to medial left ankle with onset 1 hour prior to arrival. She states she was rollerblading at skating rink and tripped over a low step, falling and hearing a pop. She states pain shoots up her leg when she rotates her ankle. She denies neck pain or LOC. Denies hitting her head or any other injury.  Past Medical History  Diagnosis Date  . Hypertension   . Headache(784.0)   . Low back pain   . 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 Tobacco abuse    Past Surgical History  Procedure Laterality Date  . Tubal ligation    . Aneurysm coiling  01/2010    basilar artery  . Hernia repair     Family History  Problem Relation Age of Onset  . Adopted: Yes  . Alcohol abuse Neg Hx   . Cancer Neg Hx   . Early death Neg Hx   . Heart disease Neg Hx   . Hyperlipidemia Neg Hx   . Hypertension Neg Hx   . Stroke Neg Hx    History  Substance Use Topics  . Smoking status: Current Every Day Smoker -- 0.50 packs/day for 15 years    Types: Cigarettes  . Smokeless tobacco: Not on file  . Alcohol Use: Yes     Comment: social   OB History    No data available     Review of Systems  Musculoskeletal: Negative for neck pain.       Left ankle pain  All other systems reviewed and are negative.     Allergies  Amlodipine  Home Medications   Prior to Admission medications   Medication Sig Start Date End Date Taking? Authorizing Provider  acetaminophen (TYLENOL) 500 MG tablet Take 500 mg by mouth every 6 (six) hours as needed for headache.    Historical Provider, MD   Beclomethasone Dipropionate (QNASL) 80 MCG/ACT AERS Place 4 puffs into the nose daily. 10/04/13   Janith Lima, MD  cephALEXin (KEFLEX) 500 MG capsule Take 1 capsule (500 mg total) by mouth 2 (two) times daily. 01/28/14   Gregor Hams, MD  cyclobenzaprine (FLEXERIL) 10 MG tablet Take 1 tablet (10 mg total) by mouth 2 (two) times daily as needed for muscle spasms. 03/17/14   Linus Mako, PA-C  HYDROcodone-acetaminophen (NORCO/VICODIN) 5-325 MG per tablet Take 1-2 tablets by mouth every 4 (four) hours as needed. 03/17/14   Rochel Privett Marilu Favre, PA-C  ibuprofen (ADVIL,MOTRIN) 600 MG tablet Take 1 tablet (600 mg total) by mouth every 6 (six) hours as needed. 03/17/14   Kenrick Pore Marilu Favre, PA-C  montelukast (SINGULAIR) 10 MG tablet Take 1 tablet (10 mg total) by mouth at bedtime. 10/04/13   Janith Lima, MD  nebivolol (BYSTOLIC) 5 MG tablet Take 5 mg by mouth at bedtime. 10/20/12   Janith Lima, MD   BP 143/89 mmHg  Pulse 95  Temp(Src) 98.7 F (37.1 C)  Resp 16  Ht 5\' 2"  (1.575 m)  Wt 157 lb (71.215 kg)  BMI 28.71 kg/m2  SpO2 97%  LMP 02/15/2014 (Approximate) Physical Exam  Constitutional: She is oriented to person, place, and time. She appears well-developed and well-nourished.  HENT:  Head: Normocephalic and atraumatic.  Eyes: Conjunctivae are normal.  Neck: Normal range of motion. Neck supple.  Pulmonary/Chest: Effort normal.  Musculoskeletal:       Left ankle: She exhibits decreased range of motion and swelling. She exhibits no ecchymosis, no deformity, no laceration and normal pulse. Tenderness. Lateral malleolus and medial malleolus tenderness found. No AITFL, no CF ligament, no posterior TFL, no head of 5th metatarsal and no proximal fibula tenderness found. Achilles tendon normal.  Neurological: She is alert and oriented to person, place, and time.  Skin: Skin is warm and dry.  Psychiatric: She has a normal mood and affect.  Nursing note and vitals reviewed.   ED Course   Procedures (including critical care time)  DIAGNOSTIC STUDIES: Oxygen Saturation is 97% on room air, normal by my interpretation.    COORDINATION OF CARE: 11:48 PM Discussed with patient that x-ray seems to reveal evidence of fracture, still pending radiology reading. Discussed treatent plan including pain medication. The patient agrees with the plan and has no further questions at this time. Xray shows posterior tibial fracture. Concern for ligament damage as well due to swelling. Will put in posterior splint with stirrups and refer to Ortho. Will give pain meds, NSAIDs and muscle relaxers.   Labs Review Labs Reviewed - No data to display  Imaging Review Dg Ankle Complete Left  03/16/2014   CLINICAL DATA:  Left ankle pain after fall.  EXAM: LEFT ANKLE COMPLETE - 3+ VIEW  COMPARISON:  None.  FINDINGS: There is a nondisplaced fracture of the posterior tibial tubercle. No additional fracture. The ankle mortise is preserved. There is soft tissue edema, most significant laterally.  IMPRESSION: Nondisplaced posterior tibial tubercle fracture.   Electronically Signed   By: Jeb Levering M.D.   On: 03/16/2014 23:49     EKG Interpretation None      MDM   Final diagnoses:  Tibial fracture, left, closed, initial encounter   39 y.o.Kathy Chaney's evaluation in the Emergency Department is complete. It has been determined that no acute conditions requiring further emergency intervention are present at this time. The patient/guardian have been advised of the diagnosis and plan. We have discussed signs and symptoms that warrant return to the ED, such as changes or worsening in symptoms.  Vital signs are stable at discharge. Filed Vitals:   03/16/14 2240  BP: 143/89  Pulse: 95  Temp: 98.7 F (37.1 C)  Resp: 16    Patient/guardian has voiced understanding and agreed to follow-up with the PCP or specialist.    I personally performed the services described in this documentation, which  was scribed in my presence. The recorded information has been reviewed and is accurate.   Linus Mako, PA-C 03/17/14 0017  Janice Norrie, MD 03/17/14 [phone]

## 2014-03-17 MED ORDER — HYDROCODONE-ACETAMINOPHEN 5-325 MG PO TABS: 1.0000 | ORAL_TABLET | ORAL | Status: DC | PRN

## 2014-03-17 MED ORDER — IBUPROFEN 600 MG PO TABS: 600.0000 mg | ORAL_TABLET | Freq: Four times a day (QID) | ORAL | Status: DC | PRN

## 2014-03-17 MED ORDER — OXYCODONE-ACETAMINOPHEN 5-325 MG PO TABS
1.0000 | ORAL_TABLET | Freq: Once | ORAL | Status: AC
  Administered 2014-03-17: 1 via ORAL
  Filled 2014-03-17: qty 1

## 2014-03-17 MED ORDER — CYCLOBENZAPRINE HCL 10 MG PO TABS: 10.0000 mg | ORAL_TABLET | Freq: Two times a day (BID) | ORAL | Status: DC | PRN

## 2014-03-17 MED ORDER — IBUPROFEN 400 MG PO TABS
800.0000 mg | ORAL_TABLET | Freq: Once | ORAL | Status: AC
  Administered 2014-03-17: 800 mg via ORAL
  Filled 2014-03-17: qty 2

## 2014-03-17 NOTE — Progress Notes (Signed)
Orthopedic Tech Progress Note Patient Details:  Kathy Chaney 09-01-1975 [id number]  Ortho Devices Type of Ortho Device: Stirrup splint, Post (short) splint Splint Material: Fiberglass Ortho Device/Splint Interventions: Application   [name] 03/17/2014, 12:15 AM

## 2014-03-17 NOTE — ED Notes (Signed)
Ortho paged. 

## 2014-03-17 NOTE — Discharge Instructions (Signed)
Cast or Splint Care Casts and splints support injured limbs and keep bones from moving while they heal. It is important to care for your cast or splint at home.  HOME CARE INSTRUCTIONS  Keep the cast or splint uncovered during the drying period. It can take 24 to 48 hours to dry if it is made of plaster. A fiberglass cast will dry in less than 1 hour.  Do not rest the cast on anything harder than a pillow for the first 24 hours.  Do not put weight on your injured limb or apply pressure to the cast until your health care provider gives you permission.  Keep the cast or splint dry. Wet casts or splints can lose their shape and may not support the limb as well. A wet cast that has lost its shape can also create harmful pressure on your skin when it dries. Also, wet skin can become infected.  Cover the cast or splint with a plastic bag when bathing or when out in the rain or snow. If the cast is on the trunk of the body, take sponge baths until the cast is removed.  If your cast does become wet, dry it with a towel or a blow dryer on the cool setting only.  Keep your cast or splint clean. Soiled casts may be wiped with a moistened cloth.  Do not place any hard or soft foreign objects under your cast or splint, such as cotton, toilet paper, lotion, or powder.  Do not try to scratch the skin under the cast with any object. The object could get stuck inside the cast. Also, scratching could lead to an infection. If itching is a problem, use a blow dryer on a cool setting to relieve discomfort.  Do not trim or cut your cast or remove padding from inside of it.  Exercise all joints next to the injury that are not immobilized by the cast or splint. For example, if you have a long leg cast, exercise the hip joint and toes. If you have an arm cast or splint, exercise the shoulder, elbow, thumb, and fingers.  Elevate your injured arm or leg on 1 or 2 pillows for the first 1 to 3 days to decrease  swelling and pain.It is best if you can comfortably elevate your cast so it is higher than your heart. SEEK MEDICAL CARE IF:   Your cast or splint cracks.  Your cast or splint is too tight or too loose.  You have unbearable itching inside the cast.  Your cast becomes wet or develops a soft spot or area.  You have a bad smell coming from inside your cast.  You get an object stuck under your cast.  Your skin around the cast becomes red or raw.  You have new pain or worsening pain after the cast has been applied. SEEK IMMEDIATE MEDICAL CARE IF:   You have fluid leaking through the cast.  You are unable to move your fingers or toes.  You have discolored (blue or white), cool, painful, or very swollen fingers or toes beyond the cast.  You have tingling or numbness around the injured area.    Tibial Fracture, Adult You have a fracture (break in bone) of your tibia. This is the large "shin" bone in your lower leg. These fractures are easily diagnosed with x-rays. TREATMENT  You have a simple fracture which usually will heal without disability. It can be treated with simple immobilization. This means the bone can  be held with a cast or splint in a favorable position until your caregiver feels it is stable (healed well enough). Then you can begin range of motion exercises to keep your knee and ankle limber (moving well). HOME CARE INSTRUCTIONS   Apply ice to the injury for 15-20 minutes, 03-04 times per day while awake, for 2 days. Put the ice in a plastic bag and place a thin towel between the bag of ice and your cast.  If you have a plaster or fiberglass cast:  Do not try to scratch the skin under the cast using sharp or pointed objects.  Check the skin around the cast every day. You may put lotion on any red or sore areas.  Keep your cast dry and clean.  If you have a plaster splint:  Wear the splint as directed.  You may loosen the elastic around the splint if your toes  become numb, tingle, or turn cold or blue.  Do not put pressure on any part of your cast or splint until it is fully hardened.  Your cast or splint can be protected during bathing with a plastic bag. Do not lower the cast or splint into water.  Use crutches as directed.  Only take over-the-counter or prescription medicines for pain, discomfort, or fever as directed by your caregiver.  See your caregiver as directed. It is very important to keep all follow-up referrals and appointments in order to avoid any long-term problems with your leg and ankle including chronic pain, inability to move the ankle normally, failure of the fracture to heal and permanent disability. SEEK IMMEDIATE MEDICAL CARE IF:   Pain is becoming worse rather than better, or if pain is uncontrolled with medications.  You have increased swelling, pain, or redness in the foot.  You begin to lose feeling in your foot or toes.  You develop a cold or blue foot or toes on the injured side.  You develop severe pain in your injured leg, especially if it is increased with movement of your toes. Document Released: 09/29/2000 Document Revised: 03/29/2011 Document Reviewed: 02/28/2013 [facility] Patient Information 2015 [place], [place]. This information is not intended to replace advice given to you by your health care provider. Make sure you discuss any questions you have with your health care provider.

## 2014-03-20 ENCOUNTER — Encounter (HOSPITAL_COMMUNITY): Payer: Self-pay

## 2014-03-20 ENCOUNTER — Emergency Department (HOSPITAL_COMMUNITY): Payer: BLUE CROSS/BLUE SHIELD

## 2014-03-20 ENCOUNTER — Emergency Department (HOSPITAL_COMMUNITY)
Admission: EM | Admit: 2014-03-20 | Discharge: 2014-03-21 | Disposition: A | Discharge status: Home / Self Care | Payer: BLUE CROSS/BLUE SHIELD | Attending: Emergency Medicine | Admitting: Emergency Medicine

## 2014-03-20 DIAGNOSIS — Z7951 Long term (current) use of inhaled steroids: Secondary | ICD-10-CM | POA: Insufficient documentation

## 2014-03-20 DIAGNOSIS — R079 Chest pain, unspecified: Secondary | ICD-10-CM | POA: Diagnosis present

## 2014-03-20 DIAGNOSIS — I10 Essential (primary) hypertension: Secondary | ICD-10-CM | POA: Insufficient documentation

## 2014-03-20 DIAGNOSIS — Z792 Long term (current) use of antibiotics: Secondary | ICD-10-CM | POA: Insufficient documentation

## 2014-03-20 DIAGNOSIS — R109 Unspecified abdominal pain: Secondary | ICD-10-CM | POA: Insufficient documentation

## 2014-03-20 DIAGNOSIS — K2970 Gastritis, unspecified, without bleeding: Secondary | ICD-10-CM | POA: Insufficient documentation

## 2014-03-20 DIAGNOSIS — Z79899 Other long term (current) drug therapy: Secondary | ICD-10-CM | POA: Insufficient documentation

## 2014-03-20 DIAGNOSIS — R1013 Epigastric pain: Secondary | ICD-10-CM

## 2014-03-20 DIAGNOSIS — Z720 Tobacco use: Secondary | ICD-10-CM | POA: Diagnosis not present

## 2014-03-20 LAB — BASIC METABOLIC PANEL
Anion gap: 12 (ref 5–15)
BUN: 6 mg/dL (ref 6–23)
CO2: 23 mmol/L (ref 19–32)
CREATININE: 0.59 mg/dL (ref 0.50–1.10)
Calcium: 9.3 mg/dL (ref 8.4–10.5)
Chloride: 102 mmol/L (ref 96–112)
GFR calc non Af Amer: >90 mL/min (ref >90)
Glucose, Bld: 92 mg/dL (ref 70–99)
Potassium: 3.8 mmol/L (ref 3.5–5.1)
Sodium: 137 mmol/L (ref 135–145)

## 2014-03-20 LAB — I-STAT TROPONIN, ED
Troponin i, poc: 0 ng/mL (ref 0.00–0.08)
Troponin i, poc: 0 ng/mL (ref 0.00–0.08)

## 2014-03-20 LAB — CBC
HEMATOCRIT: 39.3 % (ref 36.0–46.0)
Hemoglobin: 13.6 g/dL (ref 12.0–15.0)
MCH: 30.1 pg (ref 26.0–34.0)
MCHC: 34.6 g/dL (ref 30.0–36.0)
MCV: 86.9 fL (ref 78.0–100.0)
Platelets: 282 10*3/uL (ref 150–400)
RBC: 4.52 MIL/uL (ref 3.87–5.11)
RDW: 12.8 % (ref 11.5–15.5)
WBC: 10 10*3/uL (ref 4.0–10.5)

## 2014-03-20 LAB — HEPATIC FUNCTION PANEL
ALT: 22 U/L (ref 0–35)
AST: 22 U/L (ref 0–37)
Albumin: 4.1 g/dL (ref 3.5–5.2)
Alkaline Phosphatase: 60 U/L (ref 39–117)
BILIRUBIN TOTAL: 0.5 mg/dL (ref 0.3–1.2)
Bilirubin, Direct: <0.1 mg/dL (ref 0.0–0.5)
Total Protein: 7.2 g/dL (ref 6.0–8.3)

## 2014-03-20 LAB — LIPASE, BLOOD: LIPASE: 28 U/L (ref 11–59)

## 2014-03-20 MED ORDER — ONDANSETRON HCL 4 MG/2ML IJ SOLN
4.0000 mg | Freq: Once | INTRAMUSCULAR | Status: AC
  Administered 2014-03-20: 4 mg via INTRAVENOUS
  Filled 2014-03-20: qty 2

## 2014-03-20 MED ORDER — IOHEXOL 300 MG/ML  SOLN
25.0000 mL | Freq: Once | INTRAMUSCULAR | Status: AC | PRN
  Administered 2014-03-20: 25 mL via ORAL

## 2014-03-20 MED ORDER — MORPHINE SULFATE 4 MG/ML IJ SOLN
6.0000 mg | Freq: Once | INTRAMUSCULAR | Status: AC
  Administered 2014-03-20: 6 mg via INTRAVENOUS
  Filled 2014-03-20: qty 2

## 2014-03-20 MED ORDER — ONDANSETRON 4 MG PO TBDP: 4.0000 mg | ORAL_TABLET | Freq: Three times a day (TID) | ORAL | Status: DC | PRN

## 2014-03-20 MED ORDER — GI COCKTAIL ~~LOC~~
30.0000 mL | Freq: Once | ORAL | Status: AC
  Administered 2014-03-20: 30 mL via ORAL
  Filled 2014-03-20: qty 30

## 2014-03-20 MED ORDER — DICYCLOMINE HCL 20 MG PO TABS: 20.0000 mg | ORAL_TABLET | Freq: Two times a day (BID) | ORAL | Status: DC

## 2014-03-20 MED ORDER — OMEPRAZOLE 20 MG PO CPDR: 20.0000 mg | DELAYED_RELEASE_CAPSULE | Freq: Every day | ORAL | Status: DC

## 2014-03-20 NOTE — ED Provider Notes (Signed)
CSN: [id number]     Arrival date & time 03/20/14  1658 History   First MD Initiated Contact with Patient 03/20/14 1904     Chief Complaint  Patient presents with  . Chest Pain     (Consider location/radiation/quality/duration/timing/severity/associated sxs/prior Treatment) HPI  39 yo F with PMHx of HTN, recent tibial fx on 2/28, and headaches who presents with epigastric abdominal pain since 0300 this AM. Patient states that she woke up with a mild epigastric abdominal pain. She was able to change positions and use the restroom, which improved her symptoms and she was able to go back to sleep for several hours. However, throughout the day today she has had progressively worsening epigastric abdominal pain with associated nausea as well as several episodes of nonbloody, nonbilious emesis. She describes the pain as a constant gnawing and aching epigastric pain that is made worse with palpation, as well as eating. She took Pepto-Bismol at home without any significant improvement. The pain is more so epigastric rather than chest pain and she denies any current shortness of breath. Denies any palpitations or diaphoresis. No history of pancreatitis. No history of cholecystitis. Recent medication changes include taking Percocet as well as ibuprofen for her ankle pain. No known alleviating factors.  Past Medical History  Diagnosis Date  . Hypertension   . Headache(784.0)   . Low back pain   . 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 Tobacco abuse    Past Surgical History  Procedure Laterality Date  . Tubal ligation    . Aneurysm coiling  01/2010    basilar artery  . Hernia repair     Family History  Problem Relation Age of Onset  . Adopted: Yes  . Alcohol abuse Neg Hx   . Cancer Neg Hx   . Early death Neg Hx   . Heart disease Neg Hx   . Hyperlipidemia Neg Hx   . Hypertension Neg Hx   . Stroke Neg Hx    History  Substance Use Topics  . Smoking status: Current Every Day Smoker --  0.50 packs/day for 15 years    Types: Cigarettes  . Smokeless tobacco: Not on file  . Alcohol Use: Yes     Comment: social   OB History    No data available     Review of Systems  Constitutional: Negative for fever and chills.  HENT: Negative for congestion, rhinorrhea and sore throat.   Eyes: Negative for visual disturbance.  Respiratory: Negative for cough, shortness of breath and wheezing.   Cardiovascular: Negative for chest pain and leg swelling.  Gastrointestinal: Positive for nausea, vomiting and abdominal pain. Negative for diarrhea.  Genitourinary: Negative for flank pain.  Musculoskeletal: Negative for neck pain and neck stiffness.  Skin: Negative for rash.  Allergic/Immunologic: Negative for immunocompromised state.  Neurological: Negative for dizziness, weakness and headaches.      Allergies  Amlodipine  Home Medications   Prior to Admission medications   Medication Sig Start Date End Date Taking? Authorizing Provider  acetaminophen (TYLENOL) 500 MG tablet Take 500 mg by mouth every 6 (six) hours as needed for headache.    Historical Provider, MD  Beclomethasone Dipropionate (QNASL) 80 MCG/ACT AERS Place 4 puffs into the nose daily. 10/04/13   Janith Lima, MD  cephALEXin (KEFLEX) 500 MG capsule Take 1 capsule (500 mg total) by mouth 2 (two) times daily. 01/28/14   Gregor Hams, MD  cyclobenzaprine (FLEXERIL) 10 MG tablet Take  1 tablet (10 mg total) by mouth 2 (two) times daily as needed for muscle spasms. 03/17/14   Linus Mako, PA-C  HYDROcodone-acetaminophen (NORCO/VICODIN) 5-325 MG per tablet Take 1-2 tablets by mouth every 4 (four) hours as needed. 03/17/14   Tiffany Marilu Favre, PA-C  ibuprofen (ADVIL,MOTRIN) 600 MG tablet Take 1 tablet (600 mg total) by mouth every 6 (six) hours as needed. 03/17/14   Tiffany Marilu Favre, PA-C  montelukast (SINGULAIR) 10 MG tablet Take 1 tablet (10 mg total) by mouth at bedtime. 10/04/13   Janith Lima, MD  nebivolol (BYSTOLIC)  5 MG tablet Take 5 mg by mouth at bedtime. 10/20/12   Janith Lima, MD   BP 142/87 mmHg  Pulse 59  Temp(Src) 98.1 F (36.7 C)  Resp 13  SpO2 96%  LMP 02/15/2014 (Approximate) Physical Exam  Constitutional: She is oriented to person, place, and time. She appears well-developed and well-nourished. No distress.  HENT:  Head: Normocephalic and atraumatic.  Mouth/Throat: No oropharyngeal exudate.  Eyes: Conjunctivae are normal. Pupils are equal, round, and reactive to light.  Neck: Normal range of motion. Neck supple.  Cardiovascular: Normal rate, normal heart sounds and intact distal pulses.  Exam reveals no friction rub.   No murmur heard. Pulmonary/Chest: Effort normal and breath sounds normal. No respiratory distress. She has no wheezes. She has no rales.  Abdominal: Soft. Bowel sounds are normal. She exhibits no distension. There is tenderness in the epigastric area and left upper quadrant. There is no rigidity, no rebound, no guarding, no CVA tenderness, no tenderness at McBurney's point and negative Murphy's sign.  Musculoskeletal: She exhibits no edema.  Neurological: She is alert and oriented to person, place, and time.  Skin: Skin is warm. No rash noted.  Nursing note and vitals reviewed.   ED Course  Procedures (including critical care time) Labs Review Labs Reviewed  CBC  BASIC METABOLIC PANEL  HEPATIC FUNCTION PANEL  LIPASE, BLOOD  I-STAT [place], ED  Randolm Idol, ED    Imaging Review Dg Chest 2 View  03/20/2014   CLINICAL DATA:  Midsternal chest pain and burning that began around 3 a.m. today. Initial encounter.  EXAM: CHEST  2 VIEW  COMPARISON:  10/20/2012.  FINDINGS: Trachea is midline. Heart size normal. Lungs are clear. No pleural fluid.  IMPRESSION: No acute findings.   Electronically Signed   By: Lorin Picket M.D.   On: 03/20/2014 18:58     EKG Interpretation   Date/Time:  Wednesday March 20 2014 17:02:03 EST Ventricular Rate:  85 PR Interval:   164 QRS Duration: 76 QT Interval:  378 QTC Calculation: 449 R Axis:   62 Text Interpretation:  Normal sinus rhythm Normal ECG No significant change  was found Confirmed by Wyvonnia Dusky  MD, STEPHEN [phone]) on 03/20/2014 7:40:04 PM      MDM   39 yo F with PMHx of HTN, recent tibial fx on 2/28, and headaches who p/w epigastric abdominal pain since 0300 this AM. See HPI above. On arrival, T 98.69F, HR 59, RR 13, BP 142/87, satting 96% on RA. Exam as above, remarkable for mild epigastric and LUQ TTP, but abdomen o/w soft, NT, ND. Lungs CTAB, heart RRR. Remainder as above.  Pt's presentation is most consistent with likely acute gastritis in setting of recent NSAID use for LLE injury. Pt has been taking ibuprofen q6-8 hours on an empty stomach. Abdomen is o/w soft, non-distended, without rigidity and vitals are stable without signs to suggest ulcer  perforation or acute abdomen. DDx includes hepatitis, pancreatitis, enteritis. No RUQ TTP, negative Murphy's, with no h/o biliary colic sx and do not suspect cholecystitis at this time. Will give GI cocktail, analgesia, and send labs. Initially, pt reported CP so labs sent for this - EKG shows no acute changes and troponin negative. On my interview, her chest pain is epigastric in nature and she denies any actual substernal pain, SOB, diaphoresis, or other sx to suggest cardiac or pulmonary etiology. She is not tachypneic, tachycardic, hypoxia, with normal WOB, no pleurisy, no respiratory complaints and do not suspect PE.  Labs as above. CBC with no leukocytosis or anemia. BMP unremarkable and LFTs are normal with normal Bili and AST, ALT. Liapse also normal. Delta troponin normal. CXR clear. Pain improved with GI cocktail. Will PO challenge. Abdomen remains soft.  Pt experiencing increasing pain after PO challenge. Will send for CT Abd/Pelvis. Abdomen remains soft though with mild epigastric TTP. She continues to deny any RUQ TTP and is non-tender in RUQ on my exam.  Will f/u CT, continue to treat for gastritis, with plan to d/c with PPI, bentyl, and Zofran with instructions to d/c NSAIDs.  Clinical Impression: 1. Epigastric abdominal pain   2. Gastritis     Disposition: Pending CT Scan  Condition: Stable  Pt seen in conjunction with Dr. Everitt Amber, MD 03/21/14 [place], MD 03/21/14 1308

## 2014-03-20 NOTE — ED Notes (Signed)
Pt. Reports chest pain that started around 0300. States pain is cramping and is intermittent. Took GERD medication with no relief. Denies heart history. Pt. Is alert and oriented x4.

## 2014-03-20 NOTE — Discharge Instructions (Signed)
-   Stopped taking the ibuprofen, as this is likely contributing to her abdominal pain - Take the omeprazole in the morning 30 minutes before eating - Follow up with your PCP in 2-3 days for repeat exam and follow-up - Did not take any aspirin, ibuprofen, naproxen, or other NSAIDs - Do not drink alcohol  Abdominal Pain Many things can cause abdominal pain. Usually, abdominal pain is not caused by a disease and will improve without treatment. It can often be observed and treated at home. Your health care provider will do a physical exam and possibly order blood tests and X-rays to help determine the seriousness of your pain. However, in many cases, more time must pass before a clear cause of the pain can be found. Before that point, your health care provider may not know if you need more testing or further treatment. HOME CARE INSTRUCTIONS  Monitor your abdominal pain for any changes. The following actions may help to alleviate any discomfort you are experiencing:  Only take over-the-counter or prescription medicines as directed by your health care provider.  Do not take laxatives unless directed to do so by your health care provider.  Try a clear liquid diet (broth, tea, or water) as directed by your health care provider. Slowly move to a bland diet as tolerated. SEEK MEDICAL CARE IF:  You have unexplained abdominal pain.  You have abdominal pain associated with nausea or diarrhea.  You have pain when you urinate or have a bowel movement.  You experience abdominal pain that wakes you in the night.  You have abdominal pain that is worsened or improved by eating food.  You have abdominal pain that is worsened with eating fatty foods.  You have a fever. SEEK IMMEDIATE MEDICAL CARE IF:   Your pain does not go away within 2 hours.  You keep throwing up (vomiting).  Your pain is felt only in portions of the abdomen, such as the right side or the left lower portion of the abdomen.  You  pass bloody or black tarry stools. MAKE SURE YOU:  Understand these instructions.   Will watch your condition.   Will get help right away if you are not doing well or get worse.  Document Released: 10/14/2004 Document Revised: 01/09/2013 Document Reviewed: 09/13/2012 [facility] Patient Information 2015 [place], [place]. This information is not intended to replace advice given to you by your health care provider. Make sure you discuss any questions you have with your health care provider.

## 2014-03-21 ENCOUNTER — Encounter (HOSPITAL_COMMUNITY): Payer: Self-pay

## 2014-03-21 ENCOUNTER — Emergency Department (HOSPITAL_COMMUNITY): Payer: BLUE CROSS/BLUE SHIELD

## 2014-03-21 MED ORDER — IOHEXOL 300 MG/ML  SOLN
100.0000 mL | Freq: Once | INTRAMUSCULAR | Status: AC | PRN
  Administered 2014-03-21: 100 mL via INTRAVENOUS

## 2014-03-21 NOTE — ED Provider Notes (Signed)
Ct negative for acute disease, but has possible gastritis no ulceration Pt improved Advised need for f/u Pt agreeable with plan   Sharyon Cable, MD 03/21/14 0222

## 2014-06-11 ENCOUNTER — Other Ambulatory Visit (HOSPITAL_COMMUNITY): Payer: Self-pay | Admitting: Interventional Radiology

## 2014-06-11 DIAGNOSIS — I729 Aneurysm of unspecified site: Secondary | ICD-10-CM

## 2014-06-25 ENCOUNTER — Ambulatory Visit (HOSPITAL_COMMUNITY)
Admission: RE | Admit: 2014-06-25 | Discharge: 2014-06-25 | Disposition: A | Discharge status: Home / Self Care | Payer: BLUE CROSS/BLUE SHIELD | Source: Ambulatory Visit | Attending: Interventional Radiology | Admitting: Interventional Radiology

## 2014-06-25 DIAGNOSIS — Z48812 Encounter for surgical aftercare following surgery on the circulatory system: Secondary | ICD-10-CM | POA: Diagnosis not present

## 2014-06-25 DIAGNOSIS — I671 Cerebral aneurysm, nonruptured: Secondary | ICD-10-CM | POA: Diagnosis not present

## 2014-06-25 DIAGNOSIS — I729 Aneurysm of unspecified site: Secondary | ICD-10-CM

## 2014-06-25 LAB — POCT I-STAT CREATININE: Creatinine, Ser: 0.7 mg/dL (ref 0.44–1.00)

## 2014-06-25 MED ORDER — GADOBENATE DIMEGLUMINE 529 MG/ML IV SOLN
15.0000 mL | Freq: Once | INTRAVENOUS | Status: AC | PRN
  Administered 2014-06-25: 15 mL via INTRAVENOUS

## 2014-09-25 ENCOUNTER — Other Ambulatory Visit: Payer: Self-pay | Admitting: Gastroenterology

## 2014-09-26 ENCOUNTER — Emergency Department (HOSPITAL_COMMUNITY)
Admission: EM | Admit: 2014-09-26 | Discharge: 2014-09-26 | Disposition: A | Discharge status: Home / Self Care | Payer: BLUE CROSS/BLUE SHIELD | Attending: Emergency Medicine | Admitting: Emergency Medicine

## 2014-09-26 ENCOUNTER — Encounter (HOSPITAL_COMMUNITY): Payer: Self-pay

## 2014-09-26 DIAGNOSIS — Z3202 Encounter for pregnancy test, result negative: Secondary | ICD-10-CM | POA: Insufficient documentation

## 2014-09-26 DIAGNOSIS — I10 Essential (primary) hypertension: Secondary | ICD-10-CM | POA: Insufficient documentation

## 2014-09-26 DIAGNOSIS — R110 Nausea: Secondary | ICD-10-CM | POA: Insufficient documentation

## 2014-09-26 DIAGNOSIS — Z720 Tobacco use: Secondary | ICD-10-CM | POA: Diagnosis not present

## 2014-09-26 DIAGNOSIS — M545 Low back pain, unspecified: Secondary | ICD-10-CM

## 2014-09-26 DIAGNOSIS — Z79899 Other long term (current) drug therapy: Secondary | ICD-10-CM | POA: Diagnosis not present

## 2014-09-26 LAB — URINALYSIS, ROUTINE W REFLEX MICROSCOPIC
BILIRUBIN URINE: NEGATIVE
Glucose, UA: NEGATIVE mg/dL
HGB URINE DIPSTICK: NEGATIVE
Ketones, ur: NEGATIVE mg/dL
Nitrite: NEGATIVE
PH: 6.5 (ref 5.0–8.0)
Protein, ur: NEGATIVE mg/dL
Specific Gravity, Urine: 1.006 (ref 1.005–1.030)
Urobilinogen, UA: 0.2 mg/dL (ref 0.0–1.0)

## 2014-09-26 LAB — CBC WITH DIFFERENTIAL/PLATELET
BASOS PCT: 0 % (ref 0–1)
Basophils Absolute: 0 10*3/uL (ref 0.0–0.1)
EOS PCT: 1 % (ref 0–5)
Eosinophils Absolute: 0.1 10*3/uL (ref 0.0–0.7)
HCT: 37.4 % (ref 36.0–46.0)
Hemoglobin: 13 g/dL (ref 12.0–15.0)
Lymphocytes Relative: 26 % (ref 12–46)
Lymphs Abs: 2.1 10*3/uL (ref 0.7–4.0)
MCH: 30.3 pg (ref 26.0–34.0)
MCHC: 34.8 g/dL (ref 30.0–36.0)
MCV: 87.2 fL (ref 78.0–100.0)
MONO ABS: 0.5 10*3/uL (ref 0.1–1.0)
Monocytes Relative: 6 % (ref 3–12)
Neutro Abs: 5.3 10*3/uL (ref 1.7–7.7)
Neutrophils Relative %: 67 % (ref 43–77)
PLATELETS: 256 10*3/uL (ref 150–400)
RBC: 4.29 MIL/uL (ref 3.87–5.11)
RDW: 12.8 % (ref 11.5–15.5)
WBC: 8 10*3/uL (ref 4.0–10.5)

## 2014-09-26 LAB — URINE MICROSCOPIC-ADD ON

## 2014-09-26 LAB — COMPREHENSIVE METABOLIC PANEL
ALBUMIN: 3.9 g/dL (ref 3.5–5.0)
ALT: 15 U/L (ref 14–54)
AST: 17 U/L (ref 15–41)
Alkaline Phosphatase: 58 U/L (ref 38–126)
Anion gap: 8 (ref 5–15)
BUN: 6 mg/dL (ref 6–20)
CO2: 23 mmol/L (ref 22–32)
CREATININE: 0.61 mg/dL (ref 0.44–1.00)
Calcium: 8.9 mg/dL (ref 8.9–10.3)
Chloride: 107 mmol/L (ref 101–111)
GFR calc Af Amer: >60 mL/min (ref >60)
GLUCOSE: 98 mg/dL (ref 65–99)
POTASSIUM: 3.6 mmol/L (ref 3.5–5.1)
SODIUM: 138 mmol/L (ref 135–145)
Total Bilirubin: 0.4 mg/dL (ref 0.3–1.2)
Total Protein: 6.7 g/dL (ref 6.5–8.1)

## 2014-09-26 LAB — PREGNANCY, URINE: Preg Test, Ur: NEGATIVE

## 2014-09-26 MED ORDER — HYDROCODONE-ACETAMINOPHEN 5-325 MG PO TABS: 1.0000 | ORAL_TABLET | Freq: Four times a day (QID) | ORAL | Status: DC | PRN

## 2014-09-26 MED ORDER — KETOROLAC TROMETHAMINE 60 MG/2ML IM SOLN
60.0000 mg | Freq: Once | INTRAMUSCULAR | Status: AC
  Administered 2014-09-26: 60 mg via INTRAMUSCULAR
  Filled 2014-09-26: qty 2

## 2014-09-26 NOTE — ED Notes (Signed)
Pt presents with 1 month h/o R sided abdominal pain.  Pt reports pain is constant, radiates around to flank.  +nausea;  Pt reports foul smelling urine, denies dysuria.  Pt reports endoscopy yesterday d/t medication she was taking for broken foot was irritating her stomach.

## 2014-09-26 NOTE — Discharge Instructions (Signed)
Try alternating ice and heat to your low back. Take Norco as needed for severe pain and 600 mg ibuprofen every 6 hours for mild-to-moderate pain. Follow-up with your primary care doctor regarding your symptoms today. Return to the emergency department, as needed, if symptoms worsen.  Flank Pain Flank pain refers to pain that is located on the side of the body between the upper abdomen and the back. The pain may occur over a short period of time (acute) or may be long-term or reoccurring (chronic). It may be mild or severe. Flank pain can be caused by many things. CAUSES  Some of the more common causes of flank pain include:  Muscle strains.   Muscle spasms.   A disease of your spine (vertebral disk disease).   A lung infection (pneumonia).   Fluid around your lungs (pulmonary edema).   A kidney infection.   Kidney stones.   A very painful skin rash caused by the chickenpox virus (shingles).   Gallbladder disease.  [place] care will depend on the cause of your pain. In general,  Rest as directed by your caregiver.  Drink enough fluids to keep your urine clear or pale yellow.  Only take over-the-counter or prescription medicines as directed by your caregiver. Some medicines may help relieve the pain.  Tell your caregiver about any changes in your pain.  Follow up with your caregiver as directed. SEEK IMMEDIATE MEDICAL CARE IF:   Your pain is not controlled with medicine.   You have new or worsening symptoms.  Your pain increases.   You have abdominal pain.   You have shortness of breath.   You have persistent nausea or vomiting.   You have swelling in your abdomen.   You feel faint or pass out.   You have blood in your urine.  You have a fever or persistent symptoms for more than 2-3 days.  You have a fever and your symptoms suddenly get worse. MAKE SURE YOU:   Understand these instructions.  Will watch your  condition.  Will get help right away if you are not doing well or get worse. Document Released: 02/25/2005 Document Revised: 09/29/2011 Document Reviewed: 08/19/2011 [facility] Patient Information 2015 [place], [place]. This information is not intended to replace advice given to you by your health care provider. Make sure you discuss any questions you have with your health care provider.

## 2014-09-26 NOTE — ED Provider Notes (Signed)
CSN: [id number]     Arrival date & time 09/26/14  1442 History   First MD Initiated Contact with Patient 09/26/14 1509     No chief complaint on file.    (Consider location/radiation/quality/duration/timing/severity/associated sxs/prior Treatment) HPI Comments: Patient is a 39 year old female with a history of hypertension and low back pain who presents to the emergency department for further evaluation of right-sided flank pain. Patient states that she has been experiencing pain intermittently over the past month. Patient has worsened over the past 2 days. Patient states that aggravating factors include movement as well as eating and drinking. She describes the pain is aching with sporadic sharp sensations associated with worsening of her symptoms. Patient reports some mild nausea. She has not had any fever, chest pain, shortness of breath, vomiting, melanoma or hematochezia. No dysuria or hematuria. Her last menstrual period was 08/30/2014. She had 2 normal bowel movements this morning. Abdominal surgical history significant for an umbilical hernia repair as well as a tubal ligation.  The history is provided by the patient. No language interpreter was used.    Past Medical History  Diagnosis Date  . Hypertension   . Headache(784.0)   . Low back pain   . 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 Tobacco abuse    Past Surgical History  Procedure Laterality Date  . Tubal ligation    . Aneurysm coiling  01/2010    basilar artery  . Hernia repair     Family History  Problem Relation Age of Onset  . Adopted: Yes  . Alcohol abuse Neg Hx   . Cancer Neg Hx   . Early death Neg Hx   . Heart disease Neg Hx   . Hyperlipidemia Neg Hx   . Hypertension Neg Hx   . Stroke Neg Hx    Social History  Substance Use Topics  . Smoking status: Current Every Day Smoker -- 0.50 packs/day for 15 years    Types: Cigarettes  . Smokeless tobacco: None  . Alcohol Use: Yes     Comment: social   OB  History    No data available      Review of Systems  Constitutional: Negative for fever.  Respiratory: Negative for shortness of breath.   Cardiovascular: Negative for chest pain.  Gastrointestinal: Positive for nausea. Negative for vomiting, abdominal pain and diarrhea.  Genitourinary: Negative for dysuria and hematuria.       Negative for incontinence  Musculoskeletal: Positive for back pain.  All other systems reviewed and are negative.   Allergies  Amlodipine  Home Medications   Prior to Admission medications   Medication Sig Start Date End Date Taking? Authorizing Provider  acetaminophen (TYLENOL) 500 MG tablet Take 500 mg by mouth every 6 (six) hours as needed for headache.   Yes Historical Provider, MD  ibuprofen (ADVIL,MOTRIN) 600 MG tablet Take 1 tablet (600 mg total) by mouth every 6 (six) hours as needed. Patient taking differently: Take 600 mg by mouth every 6 (six) hours as needed for moderate pain.  03/17/14  Yes Tiffany Carlota Raspberry, PA-C  omeprazole (PRILOSEC) 20 MG capsule Take 1 capsule (20 mg total) by mouth daily. 03/20/14  Yes Duffy Bruce, MD  ondansetron (ZOFRAN ODT) 4 MG disintegrating tablet Take 1 tablet (4 mg total) by mouth every 8 (eight) hours as needed for nausea or vomiting. 03/20/14  Yes Duffy Bruce, MD  Beclomethasone Dipropionate (QNASL) 80 MCG/ACT AERS Place 4 puffs into the nose daily. Patient  not taking: Reported on 09/26/2014 10/04/13   Janith Lima, MD  cephALEXin (KEFLEX) 500 MG capsule Take 1 capsule (500 mg total) by mouth 2 (two) times daily. Patient not taking: Reported on 03/20/2014 01/28/14   Gregor Hams, MD  cyclobenzaprine (FLEXERIL) 10 MG tablet Take 1 tablet (10 mg total) by mouth 2 (two) times daily as needed for muscle spasms. Patient not taking: Reported on 09/26/2014 03/17/14   Delos Haring, PA-C  dicyclomine (BENTYL) 20 MG tablet Take 1 tablet (20 mg total) by mouth 2 (two) times daily. Patient not taking: Reported on 09/26/2014 03/20/14    Duffy Bruce, MD  HYDROcodone-acetaminophen (NORCO/VICODIN) 5-325 MG per tablet Take 1-2 tablets by mouth every 6 (six) hours as needed. 09/26/14   Antonietta Breach, PA-C  montelukast (SINGULAIR) 10 MG tablet Take 1 tablet (10 mg total) by mouth at bedtime. Patient not taking: Reported on 03/20/2014 10/04/13   Janith Lima, MD   BP 136/95 mmHg  Pulse 76  Temp(Src) 98.4 F (36.9 C)  Resp 14  SpO2 99%  LMP 08/30/2014   Physical Exam  Constitutional: She is oriented to person, place, and time. She appears well-developed and well-nourished. No distress.  Nontoxic/nonseptic appearing  HENT:  Head: Normocephalic and atraumatic.  Mouth/Throat: Oropharynx is clear and moist. No oropharyngeal exudate.  Eyes: Conjunctivae and EOM are normal. No scleral icterus.  Neck: Normal range of motion.  Cardiovascular: Normal rate, regular rhythm and intact distal pulses.   Pulmonary/Chest: Effort normal and breath sounds normal. No respiratory distress. She has no wheezes. She has no rales.  Abdominal: Soft. She exhibits no distension. There is no tenderness. There is no rebound and no guarding.  Abdomen soft. No focal tenderness appreciated. No masses or peritoneal signs on exam.  Musculoskeletal: Normal range of motion.  Neurological: She is alert and oriented to person, place, and time. She exhibits normal muscle tone. Coordination normal.  GCS 15. Patient moving all extremities.  Skin: Skin is warm and dry. No rash noted. She is not diaphoretic. No erythema. No pallor.  Psychiatric: She has a normal mood and affect. Her behavior is normal.  Nursing note and vitals reviewed.   ED Course  Procedures (including critical care time) Labs Review Labs Reviewed  URINALYSIS, ROUTINE W REFLEX MICROSCOPIC (NOT AT [facility]) - Abnormal; Notable for the following:    Leukocytes, UA TRACE (*)    All other components within normal limits  URINE MICROSCOPIC-ADD ON - Abnormal; Notable for the following:    Squamous  Epithelial / LPF MANY (*)    All other components within normal limits  PREGNANCY, URINE  CBC WITH DIFFERENTIAL/PLATELET  COMPREHENSIVE METABOLIC PANEL    Imaging Review No results found.   I have personally reviewed and evaluated these images and lab results as part of my medical decision-making.   EKG Interpretation None      MDM   Final diagnoses:  Right-sided low back pain without sciatica    39 year old female presents to the emergency department for evaluation of one month of right sided low back pain. Pain is reproducible on palpation. No red flags or signs concerning for cauda equina. No fever or leukocytosis today. Urine pregnancy is negative. Urinalysis does not suggest infection. Have also considered atypically presenting cholecystitis; however, patient has no leukocytosis or elevated LFTs. She has no focal tenderness in her right upper quadrant today; negative Murphy sign. Kidney stone is possible, but less likely given chronicity of symptoms and lack of microscopic hematuria.  Kidney function is preserved today. Also doubt appendicitis, TOA, or ovarian torsion, especially in light of symptom chronicity.  Given reproducibility, will manage symptoms as MSK in etiology. Patient given a short course of Norco for pain control. Have advised the use of NSAIDs for mild to moderate pain. Primary care follow-up advised and return precautions given. Patient agreeable to plan with no unaddressed concerns. Patient discharged in good condition; VSS.   Filed Vitals:   09/26/14 1530 09/26/14 1600 09/26/14 1617 09/26/14 1750  BP: 141/91 130/85 130/85 136/95  Pulse: 71 69 68 76  Temp:      Resp:   22 14  SpO2: 98% 98% 99% 99%      Antonietta Breach, PA-C 09/26/14 [place], MD 09/27/14 1610

## 2014-10-01 ENCOUNTER — Other Ambulatory Visit: Payer: Self-pay | Admitting: Physician Assistant

## 2014-10-01 DIAGNOSIS — R109 Unspecified abdominal pain: Secondary | ICD-10-CM

## 2014-10-11 ENCOUNTER — Ambulatory Visit
Admission: RE | Admit: 2014-10-11 | Discharge: 2014-10-11 | Disposition: A | Discharge status: Home / Self Care | Payer: BLUE CROSS/BLUE SHIELD | Source: Ambulatory Visit | Attending: Physician Assistant | Admitting: Physician Assistant

## 2014-10-11 DIAGNOSIS — R109 Unspecified abdominal pain: Secondary | ICD-10-CM

## 2014-10-11 MED ORDER — IOPAMIDOL (ISOVUE-300) INJECTION 61%
100.0000 mL | Freq: Once | INTRAVENOUS | Status: AC | PRN
  Administered 2014-10-11: 100 mL via INTRAVENOUS

## 2014-12-09 ENCOUNTER — Encounter (HOSPITAL_COMMUNITY): Payer: Self-pay | Admitting: Emergency Medicine

## 2014-12-09 DIAGNOSIS — Z79899 Other long term (current) drug therapy: Secondary | ICD-10-CM | POA: Diagnosis not present

## 2014-12-09 DIAGNOSIS — M545 Low back pain: Secondary | ICD-10-CM | POA: Diagnosis not present

## 2014-12-09 DIAGNOSIS — F17210 Nicotine dependence, cigarettes, uncomplicated: Secondary | ICD-10-CM | POA: Diagnosis not present

## 2014-12-09 DIAGNOSIS — R109 Unspecified abdominal pain: Secondary | ICD-10-CM | POA: Diagnosis present

## 2014-12-09 DIAGNOSIS — Z3202 Encounter for pregnancy test, result negative: Secondary | ICD-10-CM | POA: Insufficient documentation

## 2014-12-09 DIAGNOSIS — I10 Essential (primary) hypertension: Secondary | ICD-10-CM | POA: Insufficient documentation

## 2014-12-09 LAB — COMPREHENSIVE METABOLIC PANEL
ALT: 28 U/L (ref 14–54)
ANION GAP: 7 (ref 5–15)
AST: 24 U/L (ref 15–41)
Albumin: 4.2 g/dL (ref 3.5–5.0)
Alkaline Phosphatase: 50 U/L (ref 38–126)
BUN: 6 mg/dL (ref 6–20)
CO2: 29 mmol/L (ref 22–32)
Calcium: 9.3 mg/dL (ref 8.9–10.3)
Chloride: 104 mmol/L (ref 101–111)
Creatinine, Ser: 0.7 mg/dL (ref 0.44–1.00)
Glucose, Bld: 98 mg/dL (ref 65–99)
POTASSIUM: 3.6 mmol/L (ref 3.5–5.1)
Sodium: 140 mmol/L (ref 135–145)
TOTAL PROTEIN: 6.9 g/dL (ref 6.5–8.1)
Total Bilirubin: 0.4 mg/dL (ref 0.3–1.2)

## 2014-12-09 LAB — URINALYSIS, ROUTINE W REFLEX MICROSCOPIC
Bilirubin Urine: NEGATIVE
Glucose, UA: NEGATIVE mg/dL
HGB URINE DIPSTICK: NEGATIVE
Ketones, ur: NEGATIVE mg/dL
Leukocytes, UA: NEGATIVE
Nitrite: NEGATIVE
PROTEIN: NEGATIVE mg/dL
Specific Gravity, Urine: 1.022 (ref 1.005–1.030)
pH: 7.5 (ref 5.0–8.0)

## 2014-12-09 LAB — LIPASE, BLOOD: Lipase: 42 U/L (ref 11–51)

## 2014-12-09 LAB — POC URINE PREG, ED: Preg Test, Ur: NEGATIVE

## 2014-12-09 LAB — CBC
HEMATOCRIT: 36.4 % (ref 36.0–46.0)
Hemoglobin: 12.5 g/dL (ref 12.0–15.0)
MCH: 30.6 pg (ref 26.0–34.0)
MCHC: 34.3 g/dL (ref 30.0–36.0)
MCV: 89 fL (ref 78.0–100.0)
Platelets: 261 10*3/uL (ref 150–400)
RBC: 4.09 MIL/uL (ref 3.87–5.11)
RDW: 13.2 % (ref 11.5–15.5)
WBC: 8.6 10*3/uL (ref 4.0–10.5)

## 2014-12-09 NOTE — ED Notes (Signed)
Pt. reports right flank pain radiating to mid abdomen for 3 months with nausea , denies emesis or diarrhea . No fever or chills. No dysuria or hematuria .

## 2014-12-10 ENCOUNTER — Emergency Department (HOSPITAL_COMMUNITY): Payer: BLUE CROSS/BLUE SHIELD

## 2014-12-10 ENCOUNTER — Emergency Department (HOSPITAL_COMMUNITY)
Admission: EM | Admit: 2014-12-10 | Discharge: 2014-12-10 | Disposition: A | Discharge status: Home / Self Care | Payer: BLUE CROSS/BLUE SHIELD | Attending: Emergency Medicine | Admitting: Emergency Medicine

## 2014-12-10 ENCOUNTER — Encounter (HOSPITAL_COMMUNITY): Payer: Self-pay

## 2014-12-10 DIAGNOSIS — R52 Pain, unspecified: Secondary | ICD-10-CM

## 2014-12-10 DIAGNOSIS — M7918 Myalgia, other site: Secondary | ICD-10-CM

## 2014-12-10 MED ORDER — METHOCARBAMOL 500 MG PO TABS
1000.0000 mg | ORAL_TABLET | Freq: Once | ORAL | Status: AC
  Administered 2014-12-10: 1000 mg via ORAL
  Filled 2014-12-10: qty 2

## 2014-12-10 MED ORDER — DICLOFENAC SODIUM 1 % TD GEL: 4.0000 g | Freq: Four times a day (QID) | TRANSDERMAL | Status: DC

## 2014-12-10 MED ORDER — METHOCARBAMOL 500 MG PO TABS: 500.0000 mg | ORAL_TABLET | Freq: Two times a day (BID) | ORAL | Status: DC

## 2014-12-10 MED ORDER — KETOROLAC TROMETHAMINE 60 MG/2ML IM SOLN
60.0000 mg | Freq: Once | INTRAMUSCULAR | Status: AC
  Administered 2014-12-10: 60 mg via INTRAMUSCULAR
  Filled 2014-12-10: qty 2

## 2014-12-10 NOTE — ED Provider Notes (Signed)
CSN: [id number]     Arrival date & time 12/09/14  2224 History  By signing my name below, I, Irene Pap, attest that this documentation has been prepared under the direction and in the presence of Allyanna Appleman, MD. Electronically Signed: Irene Pap, ED Scribe. 12/10/2014. 1:21 AM.   Chief Complaint  Patient presents with  . Flank Pain  . Abdominal Pain   Patient is a 39 y.o. female presenting with flank pain and abdominal pain. The history is provided by the patient. No language interpreter was used.  Flank Pain This is a new problem. The current episode started more than 1 week ago. The problem occurs constantly. The problem has been gradually worsening. Associated symptoms include abdominal pain. Pertinent negatives include no chest pain and no shortness of breath. Nothing relieves the symptoms. Treatments tried: Nsaids. The treatment provided no relief.  Abdominal Pain Pain location:  R flank Pain radiates to:  Does not radiate Pain severity:  Mild Onset quality:  Gradual Duration:  12 weeks Timing:  Constant Progression:  Waxing and waning Chronicity:  New Ineffective treatments:  None tried Associated symptoms: no chest pain, no diarrhea, no dysuria, no fever, no hematuria, no shortness of breath and no vomiting   HPI Comments: SHARIA BODA is a 39 y.o. Female with a hx of HTN who presents to the Emergency Department complaining of waxing and waning, gradually worsening, right flank plan that radiates to mid abdomen onset 3 months ago. She states that "it feels like something is kicking me and I can feel something moving." She reports associated nausea. Pt states that she has been seen by a gastroenterologist and her PCP where CT scans, labs, and x-rays were performed with no significant findings. She reports worsening pain with palpation and bending at the waist. She reports that she has been taking ibuprofen and naproxen to no relief. She denies diarrhea, dysuria,  hematuria, or vomiting.    Past Medical History  Diagnosis Date  . Hypertension   . Headache(784.0)   . Low back pain   . 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 Tobacco abuse    Past Surgical History  Procedure Laterality Date  . Tubal ligation    . Aneurysm coiling  01/2010    basilar artery  . Hernia repair     Family History  Problem Relation Age of Onset  . Adopted: Yes  . Alcohol abuse Neg Hx   . Cancer Neg Hx   . Early death Neg Hx   . Heart disease Neg Hx   . Hyperlipidemia Neg Hx   . Hypertension Neg Hx   . Stroke Neg Hx    Social History  Substance Use Topics  . Smoking status: Current Every Day Smoker -- 0.00 packs/day for 0 years    Types: Cigarettes  . Smokeless tobacco: None  . Alcohol Use: Yes   OB History    No data available     Review of Systems  Constitutional: Negative for fever.  Respiratory: Negative for shortness of breath.   Cardiovascular: Negative for chest pain.  Gastrointestinal: Positive for abdominal pain. Negative for vomiting and diarrhea.  Genitourinary: Positive for flank pain. Negative for dysuria, frequency, hematuria and difficulty urinating.  Neurological: Negative for weakness and numbness.  All other systems reviewed and are negative.  Allergies  Amlodipine  Home Medications   Prior to Admission medications   Medication Sig Start Date End Date Taking? Authorizing Provider  acetaminophen (  TYLENOL) 500 MG tablet Take 500 mg by mouth every 6 (six) hours as needed for headache.    Historical Provider, MD  Beclomethasone Dipropionate (QNASL) 80 MCG/ACT AERS Place 4 puffs into the nose daily. Patient not taking: Reported on 09/26/2014 10/04/13   Janith Lima, MD  cephALEXin (KEFLEX) 500 MG capsule Take 1 capsule (500 mg total) by mouth 2 (two) times daily. Patient not taking: Reported on 03/20/2014 01/28/14   Gregor Hams, MD  cyclobenzaprine (FLEXERIL) 10 MG tablet Take 1 tablet (10 mg total) by mouth 2 (two) times  daily as needed for muscle spasms. Patient not taking: Reported on 09/26/2014 03/17/14   Delos Haring, PA-C  dicyclomine (BENTYL) 20 MG tablet Take 1 tablet (20 mg total) by mouth 2 (two) times daily. Patient not taking: Reported on 09/26/2014 03/20/14   Duffy Bruce, MD  HYDROcodone-acetaminophen (NORCO/VICODIN) 5-325 MG per tablet Take 1-2 tablets by mouth every 6 (six) hours as needed. 09/26/14   Antonietta Breach, PA-C  ibuprofen (ADVIL,MOTRIN) 600 MG tablet Take 1 tablet (600 mg total) by mouth every 6 (six) hours as needed. Patient taking differently: Take 600 mg by mouth every 6 (six) hours as needed for moderate pain.  03/17/14   Tiffany Carlota Raspberry, PA-C  montelukast (SINGULAIR) 10 MG tablet Take 1 tablet (10 mg total) by mouth at bedtime. Patient not taking: Reported on 03/20/2014 10/04/13   Janith Lima, MD  omeprazole (PRILOSEC) 20 MG capsule Take 1 capsule (20 mg total) by mouth daily. 03/20/14   Duffy Bruce, MD  ondansetron (ZOFRAN ODT) 4 MG disintegrating tablet Take 1 tablet (4 mg total) by mouth every 8 (eight) hours as needed for nausea or vomiting. 03/20/14   Duffy Bruce, MD   BP 149/108 mmHg  Pulse 78  Temp(Src) 97.7 F (36.5 C) (Oral)  Resp 18  Ht 5\' 2"  (1.575 m)  Wt 159 lb 2 oz (72.179 kg)  BMI 29.10 kg/m2  SpO2 98%  LMP 11/09/2014 (Exact Date) Physical Exam  Constitutional: She is oriented to person, place, and time. She appears well-developed and well-nourished.  HENT:  Head: Normocephalic and atraumatic.  Mouth/Throat: Oropharynx is clear and moist. No oropharyngeal exudate.  Eyes: EOM are normal. Pupils are equal, round, and reactive to light.  Neck: Normal range of motion. Neck supple.  Cardiovascular: Normal rate, regular rhythm and normal heart sounds.  Exam reveals no gallop and no friction rub.   No murmur heard. Pulmonary/Chest: Effort normal and breath sounds normal. She has no wheezes. She has no rales.  Abdominal: Soft. Bowel sounds are normal. She exhibits no mass.  There is no tenderness. There is no rebound and no guarding.  Gassy  Musculoskeletal: Normal range of motion.  Tight right paraspinal muscles; no step offs, crepitus of the TRLs  Neurological: She is alert and oriented to person, place, and time. She has normal reflexes.  Skin: Skin is warm and dry.  Psychiatric: She has a normal mood and affect. Her behavior is normal.  Nursing note and vitals reviewed.   ED Course  Procedures (including critical care time) DIAGNOSTIC STUDIES: Oxygen Saturation is 98% on RA, normal by my interpretation.    COORDINATION OF CARE: 12:26 AM-Discussed treatment plan which includes labs and referrals to rehab with pt at bedside and pt agreed to plan.    Labs Review Labs Reviewed  URINALYSIS, ROUTINE W REFLEX MICROSCOPIC (NOT AT [facility]) - Abnormal; Notable for the following:    APPearance CLOUDY (*)    All  other components within normal limits  LIPASE, BLOOD  COMPREHENSIVE METABOLIC PANEL  CBC  POC URINE PREG, ED    Imaging Review Ct Renal Stone Study  12/10/2014  CLINICAL DATA:  Right flank pain radiating to the mid abdomen for 3 months. Nausea. EXAM: CT ABDOMEN AND PELVIS WITHOUT CONTRAST TECHNIQUE: Multidetector CT imaging of the abdomen and pelvis was performed following the standard protocol without IV contrast. COMPARISON:  10/11/2014 FINDINGS: Lung bases are clear. Kidneys are symmetrical in size and shape. No hydronephrosis or hydroureter. No renal, ureteral, or bladder stones identified. Bladder wall is not thickened. The unenhanced appearance of the liver, spleen, gallbladder, pancreas, adrenal glands, abdominal aorta, inferior vena cava, and retroperitoneal lymph nodes is unremarkable. Stomach, small bowel, and colon are not abnormally distended. No free air or free fluid in the abdomen. Abdominal wall musculature appears intact. Pelvis: Appendix is normal. Uterus and ovaries are not enlarged. Surgical clips consistent with tubal ligations. No  free or loculated pelvic fluid collections. No pelvic mass or lymphadenopathy. Rectosigmoid colon is decompressed. No destructive bone lesions. Degenerative changes at the lumbosacral interspace. IMPRESSION: No renal or ureteral stone or obstruction demonstrated. Electronically Signed   By: Lucienne Capers M.D.   On: 12/10/2014 01:13   I have personally reviewed and evaluated these images and lab results as part of my medical decision-making.   EKG Interpretation None      MDM   Final diagnoses:  Pain    Pain is reproducible on exam.  Scans and labs are normal.  Will treat for MSK pain and refer to sports medicine.    I personally performed the services described in this documentation, which was scribed in my presence. The recorded information has been reviewed and is accurate.       Veatrice Kells, MD 12/10/14 [phone]

## 2014-12-10 NOTE — Discharge Instructions (Signed)
Back Injury Prevention Back injuries can be very painful. They can also be difficult to heal. After having one back injury, you are more likely to injure your back again. It is important to learn how to avoid injuring or re-injuring your back. The following tips can help you to prevent a back injury. WHAT SHOULD I KNOW ABOUT PHYSICAL FITNESS?  Exercise for 30 minutes per day on most days of the week or as directed by your health care provider. Make sure to:  Do aerobic exercises, such as walking, jogging, biking, or swimming.  Do exercises that increase balance and strength, such as tai chi and yoga. These can decrease your risk of falling and injuring your back.  Do stretching exercises to help with flexibility.  Try to develop strong abdominal muscles. Your abdominal muscles provide a lot of the support that is needed by your back.  Maintain a healthy weight. This helps to decrease your risk of a back injury. WHAT SHOULD I KNOW ABOUT MY DIET?  Talk with your health care provider about your overall diet. Take supplements and vitamins only as directed by your health care provider.  Talk with your health care provider about how much calcium and vitamin D you need each day. These nutrients help to prevent weakening of the bones (osteoporosis). Osteoporosis can cause broken (fractured) bones, which lead to back pain.  Include good sources of calcium in your diet, such as dairy products, green leafy vegetables, and products that have had calcium added to them (fortified).  Include good sources of vitamin D in your diet, such as milk and foods that are fortified with vitamin D. WHAT SHOULD I KNOW ABOUT MY POSTURE?  Sit up straight and stand up straight. Avoid leaning forward when you sit or hunching over when you stand.  Choose chairs that have good low-back (lumbar) support.  If you work at a desk, sit close to it so you do not need to lean over. Keep your chin tucked in. Keep your neck  drawn back, and keep your elbows bent at a right angle. Your arms should look like the letter "L."  Sit high and close to the steering wheel when you drive. Add a lumbar support to your car seat, if needed.  Avoid sitting or standing in one position for very long. Take breaks to get up, stretch, and walk around at least one time every hour. Take breaks every hour if you are driving for long periods of time.  Sleep on your side with your knees slightly bent, or sleep on your back with a pillow under your knees. Do not lie on the front of your body to sleep. WHAT SHOULD I KNOW ABOUT LIFTING, TWISTING, AND REACHING? Lifting and Heavy Lifting  Avoid heavy lifting, especially repetitive heavy lifting. If you must do heavy lifting:  Stretch before lifting.  Work slowly.  Rest between lifts.  Use a tool such as a cart or a dolly to move objects if one is available.  Make several small trips instead of carrying one heavy load.  Ask for help when you need it, especially when moving big objects.  Follow these steps when lifting:  Stand with your feet shoulder-width apart.  Get as close to the object as you can. Do not try to pick up a heavy object that is far from your body.  Use handles or lifting straps if they are available.  Bend at your knees. Squat down, but keep your heels off the floor.  Keep your shoulders pulled back, your chin tucked in, and your back straight.  Lift the object slowly while you tighten the muscles in your legs, abdomen, and buttocks. Keep the object as close to the center of your body as possible.  Follow these steps when putting down a heavy load:  Stand with your feet shoulder-width apart.  Lower the object slowly while you tighten the muscles in your legs, abdomen, and buttocks. Keep the object as close to the center of your body as possible.  Keep your shoulders pulled back, your chin tucked in, and your back straight.  Bend at your knees. Squat  down, but keep your heels off the floor.  Use handles or lifting straps if they are available. Twisting and Reaching  Avoid lifting heavy objects above your waist.  Do not twist at your waist while you are lifting or carrying a load. If you need to turn, move your feet.  Do not bend over without bending at your knees.  Avoid reaching over your head, across a table, or for an object on a high surface. WHAT ARE SOME OTHER TIPS?  Avoid wet floors and icy ground. Keep sidewalks clear of ice to prevent falls.  Do not sleep on a mattress that is too soft or too hard.  Keep items that are used frequently within easy reach.  Put heavier objects on shelves at waist level, and put lighter objects on lower or higher shelves.  Find ways to decrease your stress, such as exercise, massage, or relaxation techniques. Stress can build up in your muscles. Tense muscles are more vulnerable to injury.  Talk with your health care provider if you feel anxious or depressed. These conditions can make back pain worse.  Wear flat heel shoes with cushioned soles.  Avoid sudden movements.  Use both shoulder straps when carrying a backpack.  Do not use any tobacco products, including cigarettes, chewing tobacco, or electronic cigarettes. If you need help quitting, ask your health care provider.   This information is not intended to replace advice given to you by your health care provider. Make sure you discuss any questions you have with your health care provider.   Document Released: 02/12/2004 Document Revised: 05/21/2014 Document Reviewed: 01/08/2014 Elsevier Interactive Patient Education [employer].

## 2014-12-10 NOTE — ED Notes (Signed)
Patient verbalized understanding of discharge instructions and prescription medications and denies any further needs nor questions at this time. VS stable.

## 2014-12-10 NOTE — ED Notes (Signed)
MD at bedside. 

## 2014-12-17 ENCOUNTER — Encounter: Payer: Self-pay | Admitting: Family Medicine

## 2014-12-17 ENCOUNTER — Ambulatory Visit (INDEPENDENT_AMBULATORY_CARE_PROVIDER_SITE_OTHER): Payer: BLUE CROSS/BLUE SHIELD | Admitting: Family Medicine

## 2014-12-17 VITALS — BP 140/89 | HR 62 | Ht 62.0 in | Wt 159.0 lb

## 2014-12-17 DIAGNOSIS — M5442 Lumbago with sciatica, left side: Secondary | ICD-10-CM | POA: Diagnosis not present

## 2014-12-17 MED ORDER — HYDROCODONE-ACETAMINOPHEN 5-325 MG PO TABS: 1.0000 | ORAL_TABLET | Freq: Four times a day (QID) | ORAL | Status: DC | PRN

## 2014-12-17 MED ORDER — PREDNISONE 10 MG PO TABS: ORAL_TABLET | ORAL | Status: DC

## 2014-12-17 NOTE — Patient Instructions (Addendum)
You have lumbar radiculopathy (a pinched nerve in your low back). A prednisone dose pack is the best option for immediate relief and may be prescribed. Day after finishing prednisone start aleve 2 tabs twice a day with food for pain and inflammation. Norco as needed for severe pain (no driving on this medicine). Flexeril or robaxin as needed for muscle spasms (no driving on this medicine if it makes you sleepy). Stay as active as possible. Physical therapy has been shown to be helpful as well. Strengthening of low back muscles, abdominal musculature are key for long term pain relief. If not improving, will consider further imaging (MRI). Call me on Monday to let me know how you're doing.

## 2014-12-18 ENCOUNTER — Ambulatory Visit: Payer: BLUE CROSS/BLUE SHIELD | Admitting: Family Medicine

## 2014-12-18 NOTE — Progress Notes (Addendum)
PCP: REDMON,NOELLE, PA-C  Subjective:   HPI: Patient is a 39 y.o. female here for low back pain.  Patient reports having 3 months of left sided low back pain radiating into left side of abdomen. Pain level 10/10, throbbing. Unable to get comfortable. Worse with twisting motions. Has had evaluation in ED with CTs of abdomen and pelvis without answer for her current back to abdominal pain. Tried ibuprofen, naproxen, muscle relaxant, topical medication. No dysuria, hematuria, bowel dysfunction.  Past Medical History  Diagnosis Date  . Hypertension   . Headache(784.0)   . Low back pain   . 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 Tobacco abuse     Current Outpatient Prescriptions on File Prior to Visit  Medication Sig Dispense Refill  . Beclomethasone Dipropionate (QNASL) 80 MCG/ACT AERS Place 4 puffs into the nose daily. (Patient not taking: Reported on 09/26/2014) 8.7 g 11  . cephALEXin (KEFLEX) 500 MG capsule Take 1 capsule (500 mg total) by mouth 2 (two) times daily. (Patient not taking: Reported on 03/20/2014) 21 capsule 0  . cyclobenzaprine (FLEXERIL) 10 MG tablet Take 1 tablet (10 mg total) by mouth 2 (two) times daily as needed for muscle spasms. (Patient not taking: Reported on 09/26/2014) 20 tablet 0  . dicyclomine (BENTYL) 20 MG tablet Take 1 tablet (20 mg total) by mouth 2 (two) times daily. (Patient not taking: Reported on 09/26/2014) 20 tablet 0  . methocarbamol (ROBAXIN) 500 MG tablet Take 1 tablet (500 mg total) by mouth 2 (two) times daily. 20 tablet 0  . montelukast (SINGULAIR) 10 MG tablet Take 1 tablet (10 mg total) by mouth at bedtime. (Patient not taking: Reported on 03/20/2014) 30 tablet 11  . omeprazole (PRILOSEC) 20 MG capsule Take 1 capsule (20 mg total) by mouth daily. (Patient taking differently: Take 20 mg by mouth 2 (two) times daily before a meal. ) 14 capsule 0  . ondansetron (ZOFRAN ODT) 4 MG disintegrating tablet Take 1 tablet (4 mg total) by mouth every  8 (eight) hours as needed for nausea or vomiting. (Patient not taking: Reported on 12/10/2014) 20 tablet 0   No current facility-administered medications on file prior to visit.    Past Surgical History  Procedure Laterality Date  . Tubal ligation    . Aneurysm coiling  01/2010    basilar artery  . Hernia repair      Allergies  Allergen Reactions  . Amlodipine     cough    Social History   Social History  . Marital Status: Married    Spouse Name: N/A  . Number of Children: N/A  . Years of Education: N/A   Occupational History  . Not on file.   Social History Main Topics  . Smoking status: Current Every Day Smoker -- 0.00 packs/day for 0 years    Types: Cigarettes  . Smokeless tobacco: Not on file  . Alcohol Use: 0.0 oz/week    0 Standard drinks or equivalent per week  . Drug Use: No  . Sexual Activity: Not on file   Other Topics Concern  . Not on file   Social History Narrative    Family History  Problem Relation Age of Onset  . Adopted: Yes  . Alcohol abuse Neg Hx   . Cancer Neg Hx   . Early death Neg Hx   . Heart disease Neg Hx   . Hyperlipidemia Neg Hx   . Hypertension Neg Hx   . Stroke  Neg Hx     BP 140/89 mmHg  Pulse 62  Ht 5\' 2"  (1.575 m)  Wt 159 lb (72.122 kg)  BMI 29.07 kg/m2  LMP 11/09/2014 (Exact Date)  Review of Systems: See HPI above.    Objective:  Physical Exam:  Gen: NAD  Back: No gross deformity, scoliosis. TTP left lumbar paraspinal region.  No midline or bony TTP. FROM with mild pain on right trunk rotation. Strength LEs 5/5 all muscle groups.   2+ MSRs in patellar and achilles tendons, equal bilaterally. Negative SLRs. Sensation intact to light touch bilaterally.  Left hip: FROM without pain. Negative logroll Negative fabers and piriformis stretches.    Assessment & Plan:  1. Low back pain - with radiation into abdomen.  No evidence shingles.  Only possible musculoskeletal cause would be a high lumbar disc  herniation with radiculopathy.  Start prednisone dose pack, norco, muscle relaxant.  Call us in 1 week for an update on her status.  Consider MRI if not improving.  Addendum:  MRI reviewed and discussed with patient.  Her symptoms are exclusively on the right side now (were on the right into the abdomen then into left hip when she saw me - now only on the right).  Only finding was possible LEFT L4 radiculopathy but this would not account for her right sided symptoms.  No right sided neural impingement up to T11-12 region.  Disc herniation and impingement above this level is extremely unlikely without major injury (MVA, fall from a height) and CTs have not shown any abnormality beyond L5-S1.  Advised I cannot explain her pain.  While muscular pain is possible it's unlikely to be this severe especially without edema, evidence of Grade 3 muscle tear.  She plans to call PCP to discuss other possibilities.

## 2014-12-18 NOTE — Assessment & Plan Note (Signed)
with radiation into abdomen.  No evidence shingles.  Only possible musculoskeletal cause would be a high lumbar disc herniation with radiculopathy.  Start prednisone dose pack, norco, muscle relaxant.  Call us in 1 week for an update on her status.  Consider MRI if not improving.

## 2014-12-19 ENCOUNTER — Encounter: Payer: Self-pay | Admitting: *Deleted

## 2014-12-19 ENCOUNTER — Telehealth: Payer: Self-pay | Admitting: Family Medicine

## 2014-12-19 NOTE — Telephone Encounter (Signed)
Spoke to patient. Letter written

## 2014-12-19 NOTE — Telephone Encounter (Signed)
It's ok to write her out for a week since our office visit if she would like.  As we discussed i'd like her to call us after a week to let us know how she's doing.

## 2014-12-23 ENCOUNTER — Telehealth: Payer: Self-pay | Admitting: Family Medicine

## 2014-12-23 NOTE — Telephone Encounter (Signed)
We discussed if she wasn't improving to go ahead with an MRI of the lumbar spine without contrast.

## 2014-12-24 ENCOUNTER — Emergency Department (HOSPITAL_COMMUNITY)
Admission: EM | Admit: 2014-12-24 | Discharge: 2014-12-24 | Disposition: A | Discharge status: Home / Self Care | Payer: BLUE CROSS/BLUE SHIELD | Attending: Emergency Medicine | Admitting: Emergency Medicine

## 2014-12-24 ENCOUNTER — Encounter (HOSPITAL_COMMUNITY): Payer: Self-pay | Admitting: General Practice

## 2014-12-24 DIAGNOSIS — Z79899 Other long term (current) drug therapy: Secondary | ICD-10-CM | POA: Diagnosis not present

## 2014-12-24 DIAGNOSIS — M545 Low back pain, unspecified: Secondary | ICD-10-CM

## 2014-12-24 DIAGNOSIS — Z87891 Personal history of nicotine dependence: Secondary | ICD-10-CM | POA: Insufficient documentation

## 2014-12-24 DIAGNOSIS — I10 Essential (primary) hypertension: Secondary | ICD-10-CM | POA: Diagnosis not present

## 2014-12-24 MED ORDER — METHOCARBAMOL 500 MG PO TABS: 500.0000 mg | ORAL_TABLET | Freq: Two times a day (BID) | ORAL | Status: DC

## 2014-12-24 MED ORDER — OXYCODONE-ACETAMINOPHEN 5-325 MG PO TABS
2.0000 | ORAL_TABLET | Freq: Once | ORAL | Status: AC
  Administered 2014-12-24: 2 via ORAL
  Filled 2014-12-24: qty 2

## 2014-12-24 MED ORDER — DIAZEPAM 5 MG PO TABS
5.0000 mg | ORAL_TABLET | Freq: Once | ORAL | Status: AC
  Administered 2014-12-24: 5 mg via ORAL
  Filled 2014-12-24: qty 1

## 2014-12-24 MED ORDER — OXYCODONE-ACETAMINOPHEN 5-325 MG PO TABS: 2.0000 | ORAL_TABLET | ORAL | Status: DC | PRN

## 2014-12-24 MED ORDER — DIAZEPAM 5 MG PO TABS: 5.0000 mg | ORAL_TABLET | Freq: Three times a day (TID) | ORAL | Status: DC | PRN

## 2014-12-24 NOTE — ED Notes (Signed)
Pt presents with constant lower back pain that started 3 months ago. Pt does not recall any events that could have lead to her back pain. Pt reports her pain got progressively worse with morning around 0200. Pt describes the pain as a sharp, and stabbing pain in her lower back. Pt rates pain 10/10. Pt also reporting left hip pain. Pt is A/O. Pt denies any CP.

## 2014-12-24 NOTE — ED Provider Notes (Addendum)
CSN: [id number]     Arrival date & time 12/24/14  0707 History   First MD Initiated Contact with Patient 12/24/14 [phone]     Chief Complaint  Patient presents with  . Back Pain      HPI  She presents for evaluation of back pain. Has symptoms for about 3 months. Does not do any heavy lifting or bending. 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  Seen and evaluated here 2 weeks ago. Was referred to sports medicine. Saw Dr. Karlton Lemon at [address] [facility]. Was given anti-inflammatories, pain medication, muscle relaxants. She states her symptoms did improve. However she still has pain daily but has been a little less common. Also finished prednisone 5 days ago. She states she had recurrence of her symptoms about 3 days ago with increasing sharp right-sided pain in her low lumbar area.  Past Medical History  Diagnosis Date  . Hypertension   . Headache(784.0)   . Low back pain   . 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 Tobacco abuse    Past Surgical History  Procedure Laterality Date  . Tubal ligation    . Aneurysm coiling  01/2010    basilar artery  . Hernia repair     Family History  Problem Relation Age of Onset  . Adopted: Yes  . Alcohol abuse Neg Hx   . Cancer Neg Hx   . Early death Neg Hx   . Heart disease Neg Hx   . Hyperlipidemia Neg Hx   . Hypertension Neg Hx   . Stroke Neg Hx    Social History  Substance Use Topics  . Smoking status: Former Smoker -- 0.00 packs/day for 0 years    Types: Cigarettes  . Smokeless tobacco: None  . Alcohol Use: 0.0 oz/week    0 Standard drinks or equivalent per week   OB History    No data available     Review of Systems  Constitutional: Negative for fever, chills, diaphoresis, appetite change and fatigue.  HENT: Negative for mouth sores, sore throat and trouble swallowing.   Eyes: Negative for visual disturbance.  Respiratory: Negative for cough, chest tightness, shortness of breath and  wheezing.   Cardiovascular: Negative for chest pain.  Gastrointestinal: Negative for nausea, vomiting, abdominal pain, diarrhea and abdominal distention.  Endocrine: Negative for polydipsia, polyphagia and polyuria.  Genitourinary: Negative for dysuria, frequency and hematuria.  Musculoskeletal: Positive for myalgias and back pain. Negative for gait problem.  Skin: Negative for color change, pallor and rash.  Neurological: Negative for dizziness, syncope, light-headedness and headaches.  Hematological: Does not bruise/bleed easily.  Psychiatric/Behavioral: Negative for behavioral problems and confusion.      Allergies  Amlodipine  Home Medications   Prior to Admission medications   Medication Sig Start Date End Date Taking? Authorizing Provider  HYDROcodone-acetaminophen (NORCO/VICODIN) 5-325 MG tablet Take 1 tablet by mouth every 6 (six) hours as needed. 12/17/14  Yes Dene Gentry, MD  Beclomethasone Dipropionate (QNASL) 80 MCG/ACT AERS Place 4 puffs into the nose daily. Patient not taking: Reported on 09/26/2014 10/04/13   Janith Lima, MD  cephALEXin (KEFLEX) 500 MG capsule Take 1 capsule (500 mg total) by mouth 2 (two) times daily. Patient not taking: Reported on 03/20/2014 01/28/14   Gregor Hams, MD  cyclobenzaprine (FLEXERIL) 10 MG tablet Take 1 tablet (10 mg total) by mouth 2 (two) times daily as needed for muscle spasms. Patient not taking:  Reported on 09/26/2014 03/17/14   Delos Haring, PA-C  diazepam (VALIUM) 5 MG tablet Take 1 tablet (5 mg total) by mouth every 8 (eight) hours as needed for anxiety or muscle spasms. 12/24/14   Tanna Furry, MD  dicyclomine (BENTYL) 20 MG tablet Take 1 tablet (20 mg total) by mouth 2 (two) times daily. Patient not taking: Reported on 09/26/2014 03/20/14   Duffy Bruce, MD  methocarbamol (ROBAXIN) 500 MG tablet Take 1 tablet (500 mg total) by mouth 2 (two) times daily. 12/24/14   Tanna Furry, MD  montelukast (SINGULAIR) 10 MG tablet Take 1 tablet (10  mg total) by mouth at bedtime. Patient not taking: Reported on 12/24/2014 10/04/13   Janith Lima, MD  omeprazole (PRILOSEC) 20 MG capsule Take 1 capsule (20 mg total) by mouth daily. Patient not taking: Reported on 12/24/2014 03/20/14   Duffy Bruce, MD  ondansetron (ZOFRAN ODT) 4 MG disintegrating tablet Take 1 tablet (4 mg total) by mouth every 8 (eight) hours as needed for nausea or vomiting. Patient not taking: Reported on 12/10/2014 03/20/14   Duffy Bruce, MD  oxyCODONE-acetaminophen (PERCOCET/ROXICET) 5-325 MG tablet Take 2 tablets by mouth every 4 (four) hours as needed. 12/24/14   Tanna Furry, MD  predniSONE (DELTASONE) 10 MG tablet 6 tabs po day 1, 5 tabs po day 2, 4 tabs po day 3, 3 tabs po day 4, 2 tabs po day 5, 1 tab po day 6 Patient not taking: Reported on 12/24/2014 12/17/14   Dene Gentry, MD   BP 141/112 mmHg  Pulse 80  Temp(Src) 97.9 F (36.6 C) (Oral)  Resp 18  Ht 5\' 2"  (1.575 m)  Wt 159 lb (72.122 kg)  BMI 29.07 kg/m2  SpO2 100%  LMP 11/09/2014 Physical Exam  Constitutional: She is oriented to person, place, and time. She appears well-developed and well-nourished. No distress.  HENT:  Head: Normocephalic.  Eyes: Conjunctivae are normal. Pupils are equal, round, and reactive to light. No scleral icterus.  Neck: Normal range of motion. Neck supple. No thyromegaly present.  Cardiovascular: Normal rate and regular rhythm.  Exam reveals no gallop and no friction rub.   No murmur heard. Pulmonary/Chest: Effort normal and breath sounds normal. No respiratory distress. She has no wheezes. She has no rales.  Abdominal: Soft. Bowel sounds are normal. She exhibits no distension. There is no tenderness. There is no rebound.  Musculoskeletal: Normal range of motion.       Arms: Neurological: She is alert and oriented to person, place, and time.   Normal symmetric strength to flex/.extend hip and knees, dorsi/plantar flex ankles. Normal symmetric sensation to all  distributions to LEs Patellar and achilles reflexes 1-2+. Downgoing Babinski   Skin: Skin is warm and dry. No rash noted.  Psychiatric: She has a normal mood and affect. Her behavior is normal.    ED Course  Procedures (including critical care time) Labs Review Labs Reviewed - No data to display  Imaging Review No results found. I have personally reviewed and evaluated these images and lab results as part of my medical decision-making.   EKG Interpretation None      MDM   Final diagnoses:  Right-sided low back pain without sciatica    No findings to suggest herniated nucleus. She states she has scheduled follow-up and MRI with Dr. Barbaraann Barthel. No indications for emergent imaging today. Plan will be symptomatically treatment.    Tanna Furry, MD 12/24/14 [id number]  Tanna Furry, MD 12/24/14 [phone]

## 2014-12-24 NOTE — Discharge Instructions (Signed)

## 2014-12-25 NOTE — Addendum Note (Signed)
Addended by: Sherrie George F on: 12/25/2014 09:17 AM   Modules accepted: Orders

## 2014-12-28 ENCOUNTER — Ambulatory Visit (HOSPITAL_BASED_OUTPATIENT_CLINIC_OR_DEPARTMENT_OTHER)
Admission: RE | Admit: 2014-12-28 | Discharge: 2014-12-28 | Disposition: A | Discharge status: Home / Self Care | Payer: BLUE CROSS/BLUE SHIELD | Source: Ambulatory Visit | Attending: Family Medicine | Admitting: Family Medicine

## 2014-12-28 DIAGNOSIS — M5442 Lumbago with sciatica, left side: Secondary | ICD-10-CM

## 2014-12-28 DIAGNOSIS — M4806 Spinal stenosis, lumbar region: Secondary | ICD-10-CM | POA: Diagnosis not present

## 2014-12-28 DIAGNOSIS — M25552 Pain in left hip: Secondary | ICD-10-CM | POA: Diagnosis not present

## 2014-12-28 DIAGNOSIS — M545 Low back pain: Secondary | ICD-10-CM | POA: Insufficient documentation

## 2014-12-28 DIAGNOSIS — M5126 Other intervertebral disc displacement, lumbar region: Secondary | ICD-10-CM | POA: Diagnosis not present

## 2014-12-28 DIAGNOSIS — R202 Paresthesia of skin: Secondary | ICD-10-CM | POA: Diagnosis not present

## 2014-12-30 ENCOUNTER — Telehealth: Payer: Self-pay | Admitting: Family Medicine

## 2014-12-30 NOTE — Telephone Encounter (Signed)
Spoke with patient - addendum in chart and note faxed over to PCP.

## 2015-02-03 ENCOUNTER — Emergency Department (HOSPITAL_COMMUNITY): Payer: BLUE CROSS/BLUE SHIELD

## 2015-02-03 ENCOUNTER — Encounter (HOSPITAL_COMMUNITY): Payer: Self-pay | Admitting: Family Medicine

## 2015-02-03 ENCOUNTER — Emergency Department (HOSPITAL_COMMUNITY)
Admission: EM | Admit: 2015-02-03 | Discharge: 2015-02-04 | Disposition: A | Discharge status: Home / Self Care | Payer: BLUE CROSS/BLUE SHIELD | Attending: Emergency Medicine | Admitting: Emergency Medicine

## 2015-02-03 DIAGNOSIS — Z79899 Other long term (current) drug therapy: Secondary | ICD-10-CM | POA: Diagnosis not present

## 2015-02-03 DIAGNOSIS — H18892 Other specified disorders of cornea, left eye: Secondary | ICD-10-CM | POA: Diagnosis not present

## 2015-02-03 DIAGNOSIS — H538 Other visual disturbances: Secondary | ICD-10-CM | POA: Insufficient documentation

## 2015-02-03 DIAGNOSIS — I10 Essential (primary) hypertension: Secondary | ICD-10-CM | POA: Insufficient documentation

## 2015-02-03 DIAGNOSIS — Z87891 Personal history of nicotine dependence: Secondary | ICD-10-CM | POA: Insufficient documentation

## 2015-02-03 LAB — CBC WITH DIFFERENTIAL/PLATELET
BASOS ABS: 0 10*3/uL (ref 0.0–0.1)
BASOS PCT: 0 %
EOS ABS: 0.1 10*3/uL (ref 0.0–0.7)
Eosinophils Relative: 1 %
HCT: 38.9 % (ref 36.0–46.0)
HEMOGLOBIN: 13.4 g/dL (ref 12.0–15.0)
LYMPHS ABS: 2.2 10*3/uL (ref 0.7–4.0)
Lymphocytes Relative: 28 %
MCH: 30.7 pg (ref 26.0–34.0)
MCHC: 34.4 g/dL (ref 30.0–36.0)
MCV: 89.2 fL (ref 78.0–100.0)
Monocytes Absolute: 0.6 10*3/uL (ref 0.1–1.0)
Monocytes Relative: 8 %
NEUTROS PCT: 63 %
Neutro Abs: 5.1 10*3/uL (ref 1.7–7.7)
Platelets: 250 10*3/uL (ref 150–400)
RBC: 4.36 MIL/uL (ref 3.87–5.11)
RDW: 13.1 % (ref 11.5–15.5)
WBC: 8.1 10*3/uL (ref 4.0–10.5)

## 2015-02-03 LAB — BASIC METABOLIC PANEL
ANION GAP: 9 (ref 5–15)
BUN: 6 mg/dL (ref 6–20)
CHLORIDE: 104 mmol/L (ref 101–111)
CO2: 26 mmol/L (ref 22–32)
Calcium: 9.1 mg/dL (ref 8.9–10.3)
Creatinine, Ser: 0.73 mg/dL (ref 0.44–1.00)
Glucose, Bld: 104 mg/dL — ABNORMAL HIGH (ref 65–99)
POTASSIUM: 3.2 mmol/L — AB (ref 3.5–5.1)
SODIUM: 139 mmol/L (ref 135–145)

## 2015-02-03 MED ORDER — ERYTHROMYCIN 5 MG/GM OP OINT
1.0000 "application " | TOPICAL_OINTMENT | Freq: Four times a day (QID) | OPHTHALMIC | Status: DC
  Administered 2015-02-03: 1 via OPHTHALMIC
  Filled 2015-02-03: qty 3.5

## 2015-02-03 MED ORDER — FLUORESCEIN SODIUM 1 MG OP STRP
2.0000 | ORAL_STRIP | Freq: Once | OPHTHALMIC | Status: AC
  Administered 2015-02-03: 2 via OPHTHALMIC
  Filled 2015-02-03: qty 2

## 2015-02-03 MED ORDER — PROPARACAINE HCL 0.5 % OP SOLN
1.0000 [drp] | Freq: Once | OPHTHALMIC | Status: AC
  Administered 2015-02-03: 1 [drp] via OPHTHALMIC
  Filled 2015-02-03: qty 15

## 2015-02-03 NOTE — Discharge Instructions (Signed)
You were seen for blurry vision.  The exact cause is unknown.  Follow up with your eye doctor.  Use the eye ointment as prescribed.    Blurred Vision Having blurred vision means that you cannot see things clearly. Your vision may seem fuzzy or out of focus. Blurred vision is a very common symptom of an eye or vision problem. Blurred vision is often a gradual blur that occurs in one eye or both eyes. There are many causes of blurred vision, including cataracts, macular degeneration, and diabetic retinopathy. Blurred vision can be diagnosed based on your symptoms and a physical exam. Tell your health care provider about any other health problems you have, any recent eye injury, and any prior surgeries. You may need to see a health care provider who specializes in eye problems (ophthalmologist). Your treatment depends on what is causing your blurred vision.  HOME CARE INSTRUCTIONS  Tell your health care provider about any changes in your blurred vision.  Do not drive or operate heavy machinery if your vision is blurry.  Keep all follow-up visits as directed by your health care provider. This is important. SEEK MEDICAL CARE IF:  Your symptoms get worse.  You have new symptoms.  You have trouble seeing at night.  You have trouble seeing up close or far away.  You have trouble noticing the difference between colors. SEEK IMMEDIATE MEDICAL CARE IF:  You have severe eye pain.  You have a severe headache.  You have flashing lights in your field of vision.  You have a sudden change in vision.  You have a sudden loss of vision.  You have vision change after an injury.  You notice drainage coming from your eyes.  You notice a rash around your eyes.   This information is not intended to replace advice given to you by your health care provider. Make sure you discuss any questions you have with your health care provider.   Document Released: 01/07/2003 Document Revised: 05/21/2014  Document Reviewed: 11/28/2013 Elsevier Interactive Patient Education [employer].

## 2015-02-03 NOTE — ED Provider Notes (Signed)
CSN: [id number]     Arrival date & time 02/03/15  1618 History   First MD Initiated Contact with Patient 02/03/15 1806     Chief Complaint  Patient presents with  . Blurred Vision     (Consider location/radiation/quality/duration/timing/severity/associated sxs/prior Treatment) HPI Comments: 40 year old female with history of left eye blurriness and decreased vision as well as brain aneurysm presents for increased left eye blurriness. The patient reports that she was sick yesterday with the stomach bug and had repeat repetitive vomiting. She reports that today she had a mild headache and noted that the vision in her left eye was worse than usual. She had been rubbing at her eye most of the day to try to get the vision to come back but was unable to. She follows with interventional radiology as they did a coiling of her aneurysm. She reports feeling symptoms she had secondary to the aneurysm was blurriness and decreased vision in the left eye. She says that she knows that she has a small area of residual aneurysm and she and her physician were afraid that this something may have happened while she was vomiting that cause this to rupture and decrease her vision. Patient reports that her vomiting resolved on its own. No abdominal pain. No diarrhea. She said other than her vision and a mild posterior headache she feels well.   Past Medical History  Diagnosis Date  . Hypertension   . Headache(784.0)   . Low back pain   . 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 Tobacco abuse    Past Surgical History  Procedure Laterality Date  . Tubal ligation    . Aneurysm coiling  01/2010    basilar artery  . Hernia repair     Family History  Problem Relation Age of Onset  . Adopted: Yes  . Alcohol abuse Neg Hx   . Cancer Neg Hx   . Early death Neg Hx   . Heart disease Neg Hx   . Hyperlipidemia Neg Hx   . Hypertension Neg Hx   . Stroke Neg Hx    Social History  Substance Use Topics  .  Smoking status: Former Smoker -- 0.00 packs/day for 0 years    Types: Cigarettes  . Smokeless tobacco: None  . Alcohol Use: 0.0 oz/week    0 Standard drinks or equivalent per week   OB History    No data available     Review of Systems  Constitutional: Negative for fever, chills and fatigue.  HENT: Negative for congestion, postnasal drip, rhinorrhea and sinus pressure.   Eyes: Positive for redness and visual disturbance. Negative for photophobia and pain.  Gastrointestinal: Positive for vomiting (resolved). Negative for nausea and diarrhea.  Genitourinary: Negative for dysuria, urgency, hematuria and flank pain.  Musculoskeletal: Negative for myalgias, back pain and neck pain.  Skin: Negative for rash.  Neurological: Negative for dizziness, seizures, speech difficulty, weakness and numbness.  Hematological: Does not bruise/bleed easily.      Allergies  Amlodipine  Home Medications   Prior to Admission medications   Medication Sig Start Date End Date Taking? Authorizing Provider  acetaminophen (TYLENOL) 500 MG tablet Take 500 mg by mouth every 6 (six) hours as needed for moderate pain.   Yes Historical Provider, MD  oxyCODONE-acetaminophen (PERCOCET/ROXICET) 5-325 MG tablet Take 2 tablets by mouth every 4 (four) hours as needed. Patient taking differently: Take 2 tablets by mouth every 4 (four) hours as needed for moderate  pain.  12/24/14  Yes Tanna Furry, MD  Beclomethasone Dipropionate (QNASL) 80 MCG/ACT AERS Place 4 puffs into the nose daily. Patient not taking: Reported on 09/26/2014 10/04/13   Janith Lima, MD  cephALEXin (KEFLEX) 500 MG capsule Take 1 capsule (500 mg total) by mouth 2 (two) times daily. Patient not taking: Reported on 03/20/2014 01/28/14   Gregor Hams, MD  cyclobenzaprine (FLEXERIL) 10 MG tablet Take 1 tablet (10 mg total) by mouth 2 (two) times daily as needed for muscle spasms. Patient not taking: Reported on 09/26/2014 03/17/14   Delos Haring, PA-C  diazepam  (VALIUM) 5 MG tablet Take 1 tablet (5 mg total) by mouth every 8 (eight) hours as needed for anxiety or muscle spasms. 12/24/14   Tanna Furry, MD  dicyclomine (BENTYL) 20 MG tablet Take 1 tablet (20 mg total) by mouth 2 (two) times daily. Patient not taking: Reported on 09/26/2014 03/20/14   Duffy Bruce, MD  HYDROcodone-acetaminophen (NORCO/VICODIN) 5-325 MG tablet Take 1 tablet by mouth every 6 (six) hours as needed. 12/17/14   Dene Gentry, MD  methocarbamol (ROBAXIN) 500 MG tablet Take 1 tablet (500 mg total) by mouth 2 (two) times daily. 12/24/14   Tanna Furry, MD  montelukast (SINGULAIR) 10 MG tablet Take 1 tablet (10 mg total) by mouth at bedtime. Patient not taking: Reported on 12/24/2014 10/04/13   Janith Lima, MD  omeprazole (PRILOSEC) 20 MG capsule Take 1 capsule (20 mg total) by mouth daily. Patient not taking: Reported on 12/24/2014 03/20/14   Duffy Bruce, MD  ondansetron (ZOFRAN ODT) 4 MG disintegrating tablet Take 1 tablet (4 mg total) by mouth every 8 (eight) hours as needed for nausea or vomiting. Patient not taking: Reported on 12/10/2014 03/20/14   Duffy Bruce, MD  predniSONE (DELTASONE) 10 MG tablet 6 tabs po day 1, 5 tabs po day 2, 4 tabs po day 3, 3 tabs po day 4, 2 tabs po day 5, 1 tab po day 6 Patient not taking: Reported on 12/24/2014 12/17/14   Dene Gentry, MD   BP 135/84 mmHg  Pulse 71  Temp(Src) 98 F (36.7 C) (Oral)  Resp 20  Ht 5\' 2"  (1.575 m)  Wt 153 lb (69.4 kg)  BMI 27.98 kg/m2  SpO2 97% Physical Exam  Constitutional: She is oriented to person, place, and time. She appears well-developed and well-nourished. No distress.  HENT:  Head: Normocephalic and atraumatic.  Right Ear: External ear normal.  Left Ear: External ear normal.  Nose: Nose normal.  Mouth/Throat: Oropharynx is clear and moist. No oropharyngeal exudate.  Eyes: EOM and lids are normal. Pupils are equal, round, and reactive to light. Lids are everted and swept, no foreign bodies found.  Right eye exhibits no discharge. No foreign body present in the right eye. Left eye exhibits no discharge. No foreign body present in the left eye. Right conjunctiva is not injected. Right conjunctiva has no hemorrhage. Left conjunctiva is injected. Left conjunctiva has no hemorrhage.  Slit lamp exam:      The right eye shows no corneal abrasion, no corneal flare, no corneal ulcer, no hyphema and no anterior chamber bulge.       The left eye shows corneal abrasion. The left eye shows no corneal flare, no corneal ulcer, no hyphema and no anterior chamber bulge.  Decreased peripheral vision in the left eye. Visual acuity 20/20 in the right eye and 20/100 in the left eye. Tonopen OD 3 OS 2.  Patient with  linear lesion over the left cornea that appears to be a corneal abrasion. No sign of ulceration.  Neck: Normal range of motion. Neck supple.  Cardiovascular: Normal rate, regular rhythm, normal heart sounds and intact distal pulses.   No murmur heard. Pulmonary/Chest: Effort normal. No respiratory distress. She has no wheezes. She has no rales.  Abdominal: Soft. She exhibits no distension. There is no tenderness.  Musculoskeletal: Normal range of motion. She exhibits no edema or tenderness.  Neurological: She is alert and oriented to person, place, and time. She has normal strength. No cranial nerve deficit or sensory deficit. Coordination and gait normal.  Skin: Skin is warm and dry. No rash noted. She is not diaphoretic.  Vitals reviewed.   ED Course  Procedures (including critical care time) Labs Review Labs Reviewed  BASIC METABOLIC PANEL - Abnormal; Notable for the following:    Potassium 3.2 (*)    Glucose, Bld 104 (*)    All other components within normal limits  CBC WITH DIFFERENTIAL/PLATELET    Imaging Review Mr Angiogram Head Wo Contrast  02/03/2015  CLINICAL DATA:  40 year old female who awoke with left eye visual changes today. Was ill yesterday with vomiting. Personal history  of endovascular treated basilar tip aneurysm. Initial encounter. EXAM: MRI HEAD WITHOUT CONTRAST MRA HEAD WITHOUT CONTRAST TECHNIQUE: Multiplanar, multiecho pulse sequences of the brain and surrounding structures were obtained without intravenous contrast. Angiographic images of the head were obtained using MRA technique without contrast. COMPARISON:  Brain MRI and intracranial MRA 06/25/2014 and earlier. FINDINGS: MRI HEAD FINDINGS Major intracranial vascular flow voids are stable. Cerebral volume remains normal. Susceptibility artifact re- identified at the level of the basilar tip aneurysm coil pack which is eccentric to the left. No restricted diffusion to suggest acute infarction. No midline shift, mass effect, evidence of mass lesion, ventriculomegaly, extra-axial collection or acute intracranial hemorrhage. Cervicomedullary junction and pituitary are within normal limits. Negative visualized cervical spine. Gray and white matter signal remains normal. Visible internal auditory structures appear normal. Mastoids are clear. Mild paranasal sinus mucosal thickening has not significantly changed. Orbits soft tissues appear stable and normal. Negative scalp soft tissues. Normal bone marrow signal. MRA HEAD FINDINGS Stable antegrade flow in the posterior circulation. Distal vertebral arteries and PICA origins appear normal. Stable basilar artery without stenosis. Stable SCA and PCA origins. Tortuous left P1 segment again noted. Posterior communicating arteries are diminutive or absent. Susceptibility artifact from basilar tip coil pack re- demonstrated. Small 2 mm area of residual flow signal just caudal to the coil pack is stable since 2012. No aneurysm flow signal elsewhere. Stable antegrade flow in both ICA siphons. No siphon stenosis. Normal ophthalmic artery origins. Normal carotid termini. Normal MCA and ACA origins. Diminutive anterior communicating artery. Visualized ACA and MCA branches are stable and  within normal limits. IMPRESSION: No acute intracranial abnormality. Stable noncontrast MRI and MRA appearance of the brain since 2012. Stable post endovascular treatment appearance of the basilar tip aneurysm. Electronically Signed   By: Genevie Ann M.D.   On: 02/03/2015 19:39   Mr Brain Wo Contrast  02/03/2015  CLINICAL DATA:  40 year old female who awoke with left eye visual changes today. Was ill yesterday with vomiting. Personal history of endovascular treated basilar tip aneurysm. Initial encounter. EXAM: MRI HEAD WITHOUT CONTRAST MRA HEAD WITHOUT CONTRAST TECHNIQUE: Multiplanar, multiecho pulse sequences of the brain and surrounding structures were obtained without intravenous contrast. Angiographic images of the head were obtained using MRA technique without contrast. COMPARISON:  Brain MRI and intracranial MRA 06/25/2014 and earlier. FINDINGS: MRI HEAD FINDINGS Major intracranial vascular flow voids are stable. Cerebral volume remains normal. Susceptibility artifact re- identified at the level of the basilar tip aneurysm coil pack which is eccentric to the left. No restricted diffusion to suggest acute infarction. No midline shift, mass effect, evidence of mass lesion, ventriculomegaly, extra-axial collection or acute intracranial hemorrhage. Cervicomedullary junction and pituitary are within normal limits. Negative visualized cervical spine. Gray and white matter signal remains normal. Visible internal auditory structures appear normal. Mastoids are clear. Mild paranasal sinus mucosal thickening has not significantly changed. Orbits soft tissues appear stable and normal. Negative scalp soft tissues. Normal bone marrow signal. MRA HEAD FINDINGS Stable antegrade flow in the posterior circulation. Distal vertebral arteries and PICA origins appear normal. Stable basilar artery without stenosis. Stable SCA and PCA origins. Tortuous left P1 segment again noted. Posterior communicating arteries are diminutive or  absent. Susceptibility artifact from basilar tip coil pack re- demonstrated. Small 2 mm area of residual flow signal just caudal to the coil pack is stable since 2012. No aneurysm flow signal elsewhere. Stable antegrade flow in both ICA siphons. No siphon stenosis. Normal ophthalmic artery origins. Normal carotid termini. Normal MCA and ACA origins. Diminutive anterior communicating artery. Visualized ACA and MCA branches are stable and within normal limits. IMPRESSION: No acute intracranial abnormality. Stable noncontrast MRI and MRA appearance of the brain since 2012. Stable post endovascular treatment appearance of the basilar tip aneurysm. Electronically Signed   By: Genevie Ann M.D.   On: 02/03/2015 19:39   I have personally reviewed and evaluated these images and lab results as part of my medical decision-making.   EKG Interpretation None      MDM  Patient was seen and evaluated in stable condition. MRI and MRA of the brain were unremarkable with stable findings. Eye examination as above. Small corneal abrasion noted over left eye. Bedside ultrasound was completed that did not show any sign of retinal detachment. Patient with history of blurry vision in the left eye and reports that it did seem to improve while she was in the emergency department. 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 She reported that she be able to follow up with her ophthalmologist outpatient as well as with interventional radiology. She was discharged home in stable condition with erythromycin ointment. Final diagnoses:  Blurred vision    1. Blurred vision    Harvel Quale, MD 02/04/15 [phone]

## 2015-02-03 NOTE — ED Notes (Signed)
Charge nurse notified that pt needs a bed per triage concerns

## 2015-02-03 NOTE — ED Notes (Addendum)
Patient transported to MRI 

## 2015-02-03 NOTE — ED Notes (Signed)
Pt here for waking up this am with a film over left eye and trouble seeing. sts she was sick yesterday with vomiting. sts hx of aneurism. Some pressure in head.

## 2015-06-18 ENCOUNTER — Other Ambulatory Visit: Payer: Self-pay | Admitting: Obstetrics & Gynecology

## 2015-06-18 ENCOUNTER — Other Ambulatory Visit (HOSPITAL_COMMUNITY)
Admission: RE | Admit: 2015-06-18 | Discharge: 2015-06-18 | Disposition: A | Discharge status: Home / Self Care | Payer: BLUE CROSS/BLUE SHIELD | Source: Ambulatory Visit | Attending: Obstetrics & Gynecology | Admitting: Obstetrics & Gynecology

## 2015-06-18 DIAGNOSIS — Z01419 Encounter for gynecological examination (general) (routine) without abnormal findings: Secondary | ICD-10-CM | POA: Diagnosis present

## 2015-06-18 DIAGNOSIS — Z113 Encounter for screening for infections with a predominantly sexual mode of transmission: Secondary | ICD-10-CM | POA: Diagnosis present

## 2015-06-19 LAB — CYTOLOGY - PAP

## 2015-06-26 ENCOUNTER — Other Ambulatory Visit (HOSPITAL_COMMUNITY): Payer: Self-pay | Admitting: Interventional Radiology

## 2015-06-26 DIAGNOSIS — I729 Aneurysm of unspecified site: Secondary | ICD-10-CM

## 2015-07-08 ENCOUNTER — Ambulatory Visit (HOSPITAL_COMMUNITY)
Admission: RE | Admit: 2015-07-08 | Discharge: 2015-07-08 | Disposition: A | Discharge status: Home / Self Care | Payer: BLUE CROSS/BLUE SHIELD | Source: Ambulatory Visit | Attending: Interventional Radiology | Admitting: Interventional Radiology

## 2015-07-08 DIAGNOSIS — I729 Aneurysm of unspecified site: Secondary | ICD-10-CM

## 2015-07-08 DIAGNOSIS — I725 Aneurysm of other precerebral arteries: Secondary | ICD-10-CM | POA: Diagnosis not present

## 2015-07-08 LAB — CREATININE, SERUM: CREATININE: 0.67 mg/dL (ref 0.44–1.00)

## 2015-07-08 MED ORDER — GADOBENATE DIMEGLUMINE 529 MG/ML IV SOLN
14.0000 mL | Freq: Once | INTRAVENOUS | Status: AC | PRN
  Administered 2015-07-08: 14 mL via INTRAVENOUS

## 2015-07-24 ENCOUNTER — Telehealth (HOSPITAL_COMMUNITY): Payer: Self-pay

## 2015-07-24 NOTE — Telephone Encounter (Signed)
Pt agreed to f/u in 1 year with MRI. AW

## 2015-08-06 ENCOUNTER — Other Ambulatory Visit: Payer: Self-pay | Admitting: Obstetrics & Gynecology

## 2015-08-06 DIAGNOSIS — Z1231 Encounter for screening mammogram for malignant neoplasm of breast: Secondary | ICD-10-CM

## 2015-08-13 ENCOUNTER — Ambulatory Visit
Admission: RE | Admit: 2015-08-13 | Discharge: 2015-08-13 | Disposition: A | Discharge status: Home / Self Care | Payer: BLUE CROSS/BLUE SHIELD | Source: Ambulatory Visit | Attending: Obstetrics & Gynecology | Admitting: Obstetrics & Gynecology

## 2015-08-13 DIAGNOSIS — Z1231 Encounter for screening mammogram for malignant neoplasm of breast: Secondary | ICD-10-CM

## 2015-09-25 ENCOUNTER — Other Ambulatory Visit: Payer: Self-pay | Admitting: Gastroenterology

## 2015-09-25 DIAGNOSIS — R109 Unspecified abdominal pain: Secondary | ICD-10-CM

## 2015-10-03 ENCOUNTER — Ambulatory Visit
Admission: RE | Admit: 2015-10-03 | Discharge: 2015-10-03 | Disposition: A | Discharge status: Home / Self Care | Payer: BLUE CROSS/BLUE SHIELD | Source: Ambulatory Visit | Attending: Gastroenterology | Admitting: Gastroenterology

## 2015-10-03 DIAGNOSIS — R109 Unspecified abdominal pain: Secondary | ICD-10-CM

## 2015-10-07 ENCOUNTER — Other Ambulatory Visit (HOSPITAL_COMMUNITY): Payer: Self-pay | Admitting: Gastroenterology

## 2015-10-07 DIAGNOSIS — R109 Unspecified abdominal pain: Secondary | ICD-10-CM

## 2015-10-21 ENCOUNTER — Encounter (HOSPITAL_COMMUNITY)
Admission: RE | Admit: 2015-10-21 | Discharge: 2015-10-21 | Disposition: A | Discharge status: Home / Self Care | Payer: BLUE CROSS/BLUE SHIELD | Source: Ambulatory Visit | Attending: Gastroenterology | Admitting: Gastroenterology

## 2015-10-21 DIAGNOSIS — R109 Unspecified abdominal pain: Secondary | ICD-10-CM | POA: Insufficient documentation

## 2015-11-12 ENCOUNTER — Emergency Department (HOSPITAL_COMMUNITY)
Admission: EM | Admit: 2015-11-12 | Discharge: 2015-11-13 | Disposition: A | Discharge status: Home / Self Care | Payer: BLUE CROSS/BLUE SHIELD | Attending: Emergency Medicine | Admitting: Emergency Medicine

## 2015-11-12 ENCOUNTER — Encounter (HOSPITAL_COMMUNITY): Payer: Self-pay

## 2015-11-12 DIAGNOSIS — R1013 Epigastric pain: Secondary | ICD-10-CM

## 2015-11-12 DIAGNOSIS — Z87891 Personal history of nicotine dependence: Secondary | ICD-10-CM | POA: Insufficient documentation

## 2015-11-12 DIAGNOSIS — I10 Essential (primary) hypertension: Secondary | ICD-10-CM | POA: Diagnosis not present

## 2015-11-12 DIAGNOSIS — K219 Gastro-esophageal reflux disease without esophagitis: Secondary | ICD-10-CM | POA: Diagnosis not present

## 2015-11-12 DIAGNOSIS — Z79899 Other long term (current) drug therapy: Secondary | ICD-10-CM | POA: Insufficient documentation

## 2015-11-12 DIAGNOSIS — R002 Palpitations: Secondary | ICD-10-CM | POA: Diagnosis not present

## 2015-11-12 DIAGNOSIS — R1084 Generalized abdominal pain: Secondary | ICD-10-CM | POA: Diagnosis present

## 2015-11-12 LAB — COMPREHENSIVE METABOLIC PANEL
ALBUMIN: 4.2 g/dL (ref 3.5–5.0)
ALK PHOS: 54 U/L (ref 38–126)
ALT: 22 U/L (ref 14–54)
AST: 20 U/L (ref 15–41)
Anion gap: 7 (ref 5–15)
BILIRUBIN TOTAL: 0.7 mg/dL (ref 0.3–1.2)
BUN: 10 mg/dL (ref 6–20)
CALCIUM: 9.5 mg/dL (ref 8.9–10.3)
CO2: 28 mmol/L (ref 22–32)
CREATININE: 0.8 mg/dL (ref 0.44–1.00)
Chloride: 103 mmol/L (ref 101–111)
GFR calc Af Amer: >60 mL/min (ref >60)
GLUCOSE: 115 mg/dL — AB (ref 65–99)
POTASSIUM: 3.9 mmol/L (ref 3.5–5.1)
Sodium: 138 mmol/L (ref 135–145)
TOTAL PROTEIN: 7 g/dL (ref 6.5–8.1)

## 2015-11-12 LAB — URINALYSIS, ROUTINE W REFLEX MICROSCOPIC
BILIRUBIN URINE: NEGATIVE
GLUCOSE, UA: NEGATIVE mg/dL
Hgb urine dipstick: NEGATIVE
KETONES UR: NEGATIVE mg/dL
NITRITE: NEGATIVE
PH: 7 (ref 5.0–8.0)
PROTEIN: NEGATIVE mg/dL
Specific Gravity, Urine: 1.009 (ref 1.005–1.030)

## 2015-11-12 LAB — CBC
HEMATOCRIT: 38 % (ref 36.0–46.0)
Hemoglobin: 13.4 g/dL (ref 12.0–15.0)
MCH: 30.9 pg (ref 26.0–34.0)
MCHC: 35.3 g/dL (ref 30.0–36.0)
MCV: 87.6 fL (ref 78.0–100.0)
PLATELETS: 293 10*3/uL (ref 150–400)
RBC: 4.34 MIL/uL (ref 3.87–5.11)
RDW: 13.3 % (ref 11.5–15.5)
WBC: 7.8 10*3/uL (ref 4.0–10.5)

## 2015-11-12 LAB — URINE MICROSCOPIC-ADD ON: RBC / HPF: NONE SEEN RBC/hpf (ref 0–5)

## 2015-11-12 LAB — PREGNANCY, URINE: Preg Test, Ur: NEGATIVE

## 2015-11-12 LAB — LIPASE, BLOOD: Lipase: 39 U/L (ref 11–51)

## 2015-11-12 MED ORDER — GI COCKTAIL ~~LOC~~
30.0000 mL | Freq: Once | ORAL | Status: AC
  Administered 2015-11-12: 30 mL via ORAL
  Filled 2015-11-12: qty 30

## 2015-11-12 NOTE — ED Provider Notes (Signed)
[facility] DEPT Provider Note   CSN: [id number] Arrival date & time: 11/12/15  1619  By signing my name below, I, Julien Nordmann, attest that this documentation has been prepared under the direction and in the presence of Waynetta Pean PA-C.  Electronically Signed: Julien Nordmann, ED Scribe. 11/12/15. 10:45 PM.    History   Chief Complaint Chief Complaint  Patient presents with  . Abdominal Pain   The history is provided by the patient. No language interpreter was used.   HPI Comments: Kathy Chaney is a 40 y.o. female who has a PMhx of HTN presents to the Emergency Department complaining of intermittent generalized abdominal pain that started earlier today. Associated intermittent palpitations for two months and frequent burping noted. She reports her pain started on both sides of her back and radiated into her abdomen throughout the day. She describes her abdominal pain as if her abdomen was "bruised". She has a hx of intermittent, chronic right sided flank pain for the past year. Pt has an abdominal surgical hx of umbilical hernia repair. She has not taken any medication to alleviate her pain. She has been evaluated by GI with no further results on a diagnosis for her symptoms. She has been able to eat today without vomiting. Her pain does not get worse with eating today.  Denies any new nausea, vomiting, dysuria, diarrhea, fevers, hematuria, urinary frequency, urinary urgency, abnormal vaginal bleeding, light-headedness, syncope, numbness, weakness, tingling in extremities, chest pain, or shortness of breath.  Past Medical History:  Diagnosis Date  . 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 Headache(784.0)   . Hypertension   . Low back pain   . Tobacco abuse     Patient Active Problem List   Diagnosis Date Noted  . Allergic rhinitis, cause unspecified 10/04/2013  . Other abnormal glucose 04/10/2012  . Headache(784.0) 04/10/2012  . Hyperthyroidism 12/30/2011  .  TOBACCO USE 02/19/2010  . Essential hypertension, benign 02/19/2010  . 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 HERNIA, UMBILICAL [id number]  . Lumbago 02/19/2010    Past Surgical History:  Procedure Laterality Date  . ANEURYSM COILING  01/2010   basilar artery  . HERNIA REPAIR    . TUBAL LIGATION      OB History    No data available       Home Medications    Prior to Admission medications   Medication Sig Start Date End Date Taking? Authorizing Provider  acetaminophen (TYLENOL) 500 MG tablet Take 500 mg by mouth every 6 (six) hours as needed for moderate pain.   Yes Historical Provider, MD  Beclomethasone Dipropionate (QNASL) 80 MCG/ACT AERS Place 4 puffs into the nose daily. Patient not taking: Reported on 11/12/2015 10/04/13   Janith Lima, MD  cephALEXin (KEFLEX) 500 MG capsule Take 1 capsule (500 mg total) by mouth 2 (two) times daily. Patient not taking: Reported on 11/12/2015 01/28/14   Gregor Hams, MD  cyclobenzaprine (FLEXERIL) 10 MG tablet Take 1 tablet (10 mg total) by mouth 2 (two) times daily as needed for muscle spasms. Patient not taking: Reported on 11/12/2015 03/17/14   Delos Haring, PA-C  diazepam (VALIUM) 5 MG tablet Take 1 tablet (5 mg total) by mouth every 8 (eight) hours as needed for anxiety or muscle spasms. Patient not taking: Reported on 11/12/2015 12/24/14   Tanna Furry, MD  dicyclomine (BENTYL) 20 MG tablet Take 1 tablet (20 mg total) by mouth 2 (two) times daily. Patient not taking: Reported on  11/12/2015 03/20/14   Duffy Bruce, MD  HYDROcodone-acetaminophen (NORCO/VICODIN) 5-325 MG tablet Take 1 tablet by mouth every 6 (six) hours as needed. Patient not taking: Reported on 11/12/2015 12/17/14   Dene Gentry, MD  methocarbamol (ROBAXIN) 500 MG tablet Take 1 tablet (500 mg total) by mouth 2 (two) times daily. Patient not taking: Reported on 11/12/2015 12/24/14   Tanna Furry, MD  montelukast (SINGULAIR) 10 MG tablet Take 1 tablet (10 mg total) by mouth  at bedtime. Patient not taking: Reported on 11/12/2015 10/04/13   Janith Lima, MD  omeprazole (PRILOSEC) 20 MG capsule Take 1 capsule (20 mg total) by mouth daily. 11/13/15   Waynetta Pean, PA-C  ondansetron (ZOFRAN ODT) 4 MG disintegrating tablet Take 1 tablet (4 mg total) by mouth every 8 (eight) hours as needed for nausea or vomiting. Patient not taking: Reported on 11/12/2015 03/20/14   Duffy Bruce, MD  oxyCODONE-acetaminophen (PERCOCET/ROXICET) 5-325 MG tablet Take 2 tablets by mouth every 4 (four) hours as needed. Patient not taking: Reported on 11/12/2015 12/24/14   Tanna Furry, MD  predniSONE (DELTASONE) 10 MG tablet 6 tabs po day 1, 5 tabs po day 2, 4 tabs po day 3, 3 tabs po day 4, 2 tabs po day 5, 1 tab po day 6 Patient not taking: Reported on 11/12/2015 12/17/14   Dene Gentry, MD    Family History Family History  Problem Relation Age of Onset  . Adopted: Yes  . Alcohol abuse Neg Hx   . Cancer Neg Hx   . Early death Neg Hx   . Heart disease Neg Hx   . Hyperlipidemia Neg Hx   . Hypertension Neg Hx   . Stroke Neg Hx     Social History Social History  Substance Use Topics  . Smoking status: Former Smoker    Packs/day: 0.00    Years: 0.00    Types: Cigarettes  . Smokeless tobacco: Never Used  . Alcohol use 0.0 oz/week     Allergies   Amlodipine   Review of Systems Review of Systems  Constitutional: Negative for chills and fever.  HENT: Negative for congestion and sore throat.   Eyes: Negative for visual disturbance.  Respiratory: Negative for cough and shortness of breath.   Cardiovascular: Positive for palpitations. Negative for chest pain and leg swelling.  Gastrointestinal: Positive for abdominal pain and nausea. Negative for constipation, diarrhea and vomiting.  Genitourinary: Positive for flank pain. Negative for difficulty urinating, dysuria, frequency, menstrual problem, pelvic pain, urgency, vaginal bleeding and vaginal discharge.    Musculoskeletal: Negative for back pain and neck pain.  Skin: Negative for rash.  Neurological: Negative for dizziness, syncope, weakness, light-headedness and headaches.  All other systems reviewed and are negative.    Physical Exam Updated Vital Signs BP 154/90 (BP Location: Left Arm)   Pulse 65   Temp 98.3 F (36.8 C) (Oral)   Resp 19   Ht 5\' 2"  (1.575 m)   Wt 69.9 kg   LMP 10/01/2015 (Exact Date) Comment: Tubes tied  SpO2 98%   BMI 28.17 kg/m   Physical Exam  Constitutional: She appears well-developed and well-nourished. No distress.  Nontoxic-appearing.  HENT:  Head: Normocephalic and atraumatic.  Mouth/Throat: Oropharynx is clear and moist.  Eyes: Conjunctivae are normal. Pupils are equal, round, and reactive to light. Right eye exhibits no discharge. Left eye exhibits no discharge.  Neck: Neck supple.  Cardiovascular: Normal rate, regular rhythm, normal heart sounds and intact distal pulses.  Exam  reveals no gallop and no friction rub.   No murmur heard. HR of 66.   Pulmonary/Chest: Effort normal and breath sounds normal. No respiratory distress. She has no wheezes. She has no rales.  Lungs are clear to auscultation bilaterally.  Abdominal: Soft. Bowel sounds are normal. She exhibits no distension and no mass. There is no tenderness. There is no rebound and no guarding.  Abdomen soft and nontender to palpation. Bowel sounds are present. No CVA or flank tenderness No Psoas or Obturator sign.  Musculoskeletal: She exhibits no edema.  Lymphadenopathy:    She has no cervical adenopathy.  Neurological: She is alert. Coordination normal.  Skin: Skin is warm and dry. Capillary refill takes less than 2 seconds. No rash noted. She is not diaphoretic. No erythema. No pallor.  Psychiatric: She has a normal mood and affect. Her behavior is normal.  Nursing note and vitals reviewed.    ED Treatments / Results  DIAGNOSTIC STUDIES: Oxygen Saturation is 98% on RA, normal by  my interpretation.  COORDINATION OF CARE:  10:38 PM Discussed treatment plan with pt at bedside and pt agreed to plan.  Labs (all labs ordered are listed, but only abnormal results are displayed) Labs Reviewed  COMPREHENSIVE METABOLIC PANEL - Abnormal; Notable for the following:       Result Value   Glucose, Bld 115 (*)    All other components within normal limits  URINALYSIS, ROUTINE W REFLEX MICROSCOPIC (NOT AT [facility]) - Abnormal; Notable for the following:    APPearance CLOUDY (*)    Leukocytes, UA SMALL (*)    All other components within normal limits  URINE MICROSCOPIC-ADD ON - Abnormal; Notable for the following:    Squamous Epithelial / LPF 6-30 (*)    Bacteria, UA FEW (*)    All other components within normal limits  LIPASE, BLOOD  CBC  PREGNANCY, URINE    EKG  EKG Interpretation  Date/Time:  Wednesday November 12 2015 23:36:37 EDT Ventricular Rate:  42 PR Interval:    QRS Duration: 90 QT Interval:  495 QTC Calculation: 414 R Axis:   26 Text Interpretation:  Sinus bradycardia Rate slower Confirmed by Wyvonnia Dusky  MD, STEPHEN ([id number]) on 11/12/2015 11:42:06 PM       Radiology No results found.  Procedures Procedures (including critical care time)  Medications Ordered in ED Medications  gi cocktail (Maalox,Lidocaine,Donnatal) (30 mLs Oral Given 11/12/15 2300)     Initial Impression / Assessment and Plan / ED Course  I have reviewed the triage vital signs and the nursing notes.  Pertinent labs & imaging results that were available during my care of the patient were reviewed by me and considered in my medical decision making (see chart for details).  Clinical Course    This is a 40 y.o. female who has a PMhx of HTN presents to the Emergency Department complaining of intermittent generalized abdominal pain that started earlier today. Associated intermittent palpitations for two months and frequent burping noted. She reports her pain started on both sides of her  back and radiated into her abdomen throughout the day. She describes her abdominal pain as if her abdomen was "bruised". She has a hx of intermittent, chronic right sided flank pain for the past year. Pt has an abdominal surgical hx of umbilical hernia repair. She has not taken any medication to alleviate her pain. She has been evaluated by GI with no further results on a diagnosis for her symptoms. She has been able to eat  today without vomiting. Her pain does not get worse with eating today. She denies fevers, vomiting, diarrhea, chest pain or shortness of breath. On exam the patient is afebrile nontoxic-appearing. Her abdomen is soft and nontender to palpation. No CVA or flank tenderness. She denies any current abdominal pain. Patient has had a recent HIDA scan that was normal. She is also to previous abdominal ultrasound. She's also had previous CT scans. No evidence of any renal stones. Patient tells me she has never taken any medicines for acid reflux. She's been having lots of burping and belching. Will provide her with a GI cocktail and reevaluate. Pregnancy test is negative. Urinalysis is nitrite negative with small leukocytes and 6-30's, 7 epithelial cells. Patient denies any urinary symptoms. Lipase is within normal limits. CMP is unremarkable. CBC is unremarkable. I did a screening EKG because the patient complaining of intermittent palpitations. She denies current palpitations. EKG shows a sinus bradycardia.  At reevaluation patient tolerated the GI cocktail well. She reports her abdominal pain has resolved. She is feeling much better. We'll have her try using omeprazole and follow up with her primary care doctor and gastroenterologist. I discussed strict return precautions. I advised the patient to follow-up with their primary care provider this week. I advised the patient to return to the emergency department with new or worsening symptoms or new concerns. The patient verbalized understanding and  agreement with plan.   This patient was discussed with Dr. Christy Gentles who agrees with assessment and plan.       Final Clinical Impressions(s) / ED Diagnoses   Final diagnoses:  Epigastric pain  Gastroesophageal reflux disease, esophagitis presence not specified  Intermittent palpitations   I personally performed the services described in this documentation, which was scribed in my presence. The recorded information has been reviewed and is accurate.      New Prescriptions Current Discharge Medication List       Waynetta Pean, PA-C 11/13/15 [place], MD 11/13/15 [phone]

## 2015-11-12 NOTE — ED Triage Notes (Signed)
Pt reports she initially had pain in her sides and the pain has radiated into her abd. She reports eating makes her nauseous. She has had [place] to examine gall bladder.

## 2015-11-13 MED ORDER — OMEPRAZOLE 20 MG PO CPDR: 20.0000 mg | DELAYED_RELEASE_CAPSULE | Freq: Every day | ORAL | 0 refills | Status: DC

## 2016-06-04 ENCOUNTER — Encounter (HOSPITAL_COMMUNITY): Payer: Self-pay

## 2016-06-04 ENCOUNTER — Ambulatory Visit (HOSPITAL_COMMUNITY)
Admission: EM | Admit: 2016-06-04 | Discharge: 2016-06-04 | Disposition: A | Discharge status: Home / Self Care | Payer: BLUE CROSS/BLUE SHIELD | Attending: Internal Medicine | Admitting: Internal Medicine

## 2016-06-04 DIAGNOSIS — R350 Frequency of micturition: Secondary | ICD-10-CM | POA: Diagnosis not present

## 2016-06-04 DIAGNOSIS — J40 Bronchitis, not specified as acute or chronic: Secondary | ICD-10-CM | POA: Diagnosis not present

## 2016-06-04 DIAGNOSIS — N3000 Acute cystitis without hematuria: Secondary | ICD-10-CM

## 2016-06-04 LAB — POCT URINALYSIS DIP (DEVICE)
Bilirubin Urine: NEGATIVE
GLUCOSE, UA: NEGATIVE mg/dL
Hgb urine dipstick: NEGATIVE
Ketones, ur: NEGATIVE mg/dL
LEUKOCYTES UA: NEGATIVE
NITRITE: NEGATIVE
Protein, ur: NEGATIVE mg/dL
Specific Gravity, Urine: 1.01 (ref 1.005–1.030)
UROBILINOGEN UA: 0.2 mg/dL (ref 0.0–1.0)
pH: 7 (ref 5.0–8.0)

## 2016-06-04 MED ORDER — GUAIFENESIN-CODEINE 100-10 MG/5ML PO SOLN: 5.0000 mL | Freq: Three times a day (TID) | ORAL | 0 refills | Status: DC | PRN

## 2016-06-04 MED ORDER — LEVOFLOXACIN 750 MG PO TABS: 750.0000 mg | ORAL_TABLET | Freq: Every day | ORAL | 0 refills | Status: AC

## 2016-06-04 MED ORDER — PHENAZOPYRIDINE HCL 200 MG PO TABS: 200.0000 mg | ORAL_TABLET | Freq: Three times a day (TID) | ORAL | 0 refills | Status: DC | PRN

## 2016-06-04 NOTE — ED Provider Notes (Signed)
CSN: [id number]     Arrival date & time 06/04/16  1747 History   First MD Initiated Contact with Patient 06/04/16 1933     Chief Complaint  Patient presents with  . Cough  . Urinary Tract Infection   (Consider location/radiation/quality/duration/timing/severity/associated sxs/prior Treatment) The history is provided by the patient.  Cough  Cough characteristics:  Non-productive, dry and hacking Sputum characteristics:  Green Severity:  Moderate Onset quality:  Gradual Duration:  3 weeks Timing:  Constant Progression:  Worsening Chronicity:  New Smoker: no   Context: not occupational exposure, not smoke exposure and not upper respiratory infection   Relieved by:  Nothing Worsened by:  Deep breathing Ineffective treatments:  Cough suppressants and decongestant Associated symptoms: sinus congestion   Associated symptoms: no chest pain, no chills, no ear fullness, no ear pain, no fever, no rhinorrhea, no shortness of breath, no sore throat and no wheezing   Urinary Tract Infection  Pain quality:  Aching and burning Pain severity:  Moderate Onset quality:  Gradual Duration:  2 days Timing:  Constant Progression:  Worsening Chronicity:  New Recent urinary tract infections: no   Relieved by:  Nothing Ineffective treatments:  None tried Urinary symptoms: discolored urine, foul-smelling urine and frequent urination   Urinary symptoms: no bladder incontinence   Associated symptoms: nausea   Associated symptoms: no abdominal pain, no fever, no vaginal discharge and no vomiting     Past Medical History:  Diagnosis Date  . 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 Headache(784.0)   . Hypertension   . Low back pain   . Tobacco abuse    Past Surgical History:  Procedure Laterality Date  . ANEURYSM COILING  01/2010   basilar artery  . HERNIA REPAIR    . TUBAL LIGATION     Family History  Problem Relation Age of Onset  . Adopted: Yes  . Alcohol abuse Neg Hx   .  Cancer Neg Hx   . Early death Neg Hx   . Heart disease Neg Hx   . Hyperlipidemia Neg Hx   . Hypertension Neg Hx   . Stroke Neg Hx    Social History  Substance Use Topics  . Smoking status: Former Smoker    Packs/day: 0.00    Years: 0.00    Types: Cigarettes  . Smokeless tobacco: Never Used  . Alcohol use 0.0 oz/week   OB History    No data available     Review of Systems  Constitutional: Negative for chills and fever.  HENT: Positive for congestion. Negative for ear pain, rhinorrhea, sinus pain, sinus pressure and sore throat.   Respiratory: Positive for cough. Negative for shortness of breath and wheezing.   Cardiovascular: Negative for chest pain and palpitations.  Gastrointestinal: Positive for nausea. Negative for abdominal pain, constipation, diarrhea and vomiting.  Genitourinary: Positive for dysuria, frequency and urgency. Negative for vaginal bleeding and vaginal discharge.  Musculoskeletal: Negative.   Skin: Negative.   Neurological: Negative.     Allergies  Amlodipine  Home Medications   Prior to Admission medications   Medication Sig Start Date End Date Taking? Authorizing Provider  acetaminophen (TYLENOL) 500 MG tablet Take 500 mg by mouth every 6 (six) hours as needed for moderate pain.    [provider]  guaiFENesin-codeine 100-10 MG/5ML syrup Take 5 mLs by mouth 3 (three) times daily as needed for cough. 06/04/16   Barnet Glasgow, NP  levofloxacin (LEVAQUIN) 750 MG tablet Take 1  tablet (750 mg total) by mouth daily. 06/04/16 06/09/16  Barnet Glasgow, NP  phenazopyridine (PYRIDIUM) 200 MG tablet Take 1 tablet (200 mg total) by mouth 3 (three) times daily as needed for pain. 06/04/16   Barnet Glasgow, NP   Meds Ordered and Administered this Visit  Medications - No data to display  BP (!) 154/87   Pulse 73   Temp 98.5 F (36.9 C) (Oral)   Resp 20   SpO2 100%  No data found.   Physical Exam  Constitutional: She is oriented to person,  place, and time. She appears well-developed and well-nourished. No distress.  HENT:  Head: Normocephalic.  Right Ear: External ear normal.  Left Ear: External ear normal.  Nose: Mucosal edema and rhinorrhea present.  Eyes: Conjunctivae are normal.  Neck: Normal range of motion. Neck supple.  Cardiovascular: Normal rate and regular rhythm.   Pulmonary/Chest: Effort normal and breath sounds normal.  Abdominal: Soft. Bowel sounds are normal.  Neurological: She is alert and oriented to person, place, and time.  Skin: Skin is warm and dry. Capillary refill takes less than 2 seconds. She is not diaphoretic.  Psychiatric: She has a normal mood and affect. Her behavior is normal.  Nursing note and vitals reviewed.   Urgent Care Course     Procedures (including critical care time)  Labs Review Labs Reviewed  POCT URINALYSIS DIP (DEVICE)    Imaging Review No results found.      MDM   1. Bronchitis   2. Acute cystitis without hematuria    For bronchitis, and UTI, given Levaquin, as this will cover both types of infection. Provided Cheratussin for cough, and Pyridium for dysuria. Provided counseling on over-the-counter therapies, encouraged over-the-counter antihistamine for the remainder allergy season. Follow-up with primary care in 1 week.    Barnet Glasgow, NP 06/04/16 [place]

## 2016-06-04 NOTE — ED Triage Notes (Signed)
Pt having productive cough for 3 weeks with green mucus said she has tried numerous otc medications without relief. Having tightness in her chest as well.  No fever. Also states she is having flank pain, foul odor and frequency. Said she took cranberry pills.

## 2016-06-04 NOTE — Discharge Instructions (Signed)
For your bronchitis, I have prescribed Levaquin to treat this infection. This antibiotic will also cover for a UTI as well. I have also prescribed a medicine for cough called Cheratussin, this medicine is a narcotic, it will cause drowsiness, and it is addictive. Do not take more than what is necessary, do not drink alcohol while taking, and do not operate any heavy machinery while taking this medicine. I also recommend taking an antihistamine daily for the remainder allergy season such as Claritin or Zyrtec. If your symptoms persist, or fail to resolve, follow up with your primary care provider in one week.

## 2016-06-30 ENCOUNTER — Other Ambulatory Visit (HOSPITAL_COMMUNITY): Payer: Self-pay | Admitting: Interventional Radiology

## 2016-06-30 DIAGNOSIS — I729 Aneurysm of unspecified site: Secondary | ICD-10-CM

## 2016-07-09 ENCOUNTER — Ambulatory Visit (HOSPITAL_COMMUNITY)
Admission: RE | Admit: 2016-07-09 | Discharge: 2016-07-09 | Disposition: A | Discharge status: Home / Self Care | Payer: BLUE CROSS/BLUE SHIELD | Source: Ambulatory Visit | Attending: Interventional Radiology | Admitting: Interventional Radiology

## 2016-07-09 ENCOUNTER — Encounter (HOSPITAL_COMMUNITY): Payer: Self-pay

## 2016-07-09 ENCOUNTER — Ambulatory Visit (HOSPITAL_COMMUNITY): Payer: BLUE CROSS/BLUE SHIELD

## 2016-07-09 DIAGNOSIS — I729 Aneurysm of unspecified site: Secondary | ICD-10-CM | POA: Diagnosis present

## 2016-07-09 MED ORDER — GADOBENATE DIMEGLUMINE 529 MG/ML IV SOLN
15.0000 mL | Freq: Once | INTRAVENOUS | Status: AC | PRN
  Administered 2016-07-09: 15 mL via INTRAVENOUS

## 2016-07-15 ENCOUNTER — Telehealth (HOSPITAL_COMMUNITY): Payer: Self-pay

## 2016-07-15 NOTE — Telephone Encounter (Signed)
Pt agreed to f/u in 1 yr with mri. AW

## 2016-07-15 NOTE — Telephone Encounter (Signed)
Left message for pt to return call. AW 

## 2016-08-20 ENCOUNTER — Other Ambulatory Visit: Payer: Self-pay | Admitting: Obstetrics & Gynecology

## 2016-08-20 ENCOUNTER — Ambulatory Visit
Admission: RE | Admit: 2016-08-20 | Discharge: 2016-08-20 | Disposition: A | Discharge status: Home / Self Care | Payer: BLUE CROSS/BLUE SHIELD | Source: Ambulatory Visit | Attending: Obstetrics & Gynecology | Admitting: Obstetrics & Gynecology

## 2016-08-20 DIAGNOSIS — Z1231 Encounter for screening mammogram for malignant neoplasm of breast: Secondary | ICD-10-CM

## 2017-06-20 ENCOUNTER — Other Ambulatory Visit (HOSPITAL_COMMUNITY): Payer: Self-pay | Admitting: Interventional Radiology

## 2017-06-20 DIAGNOSIS — I729 Aneurysm of unspecified site: Secondary | ICD-10-CM

## 2017-07-07 ENCOUNTER — Ambulatory Visit (HOSPITAL_COMMUNITY)
Admission: RE | Admit: 2017-07-07 | Discharge: 2017-07-07 | Disposition: A | Discharge status: Home / Self Care | Payer: BLUE CROSS/BLUE SHIELD | Source: Ambulatory Visit | Attending: Interventional Radiology | Admitting: Interventional Radiology

## 2017-07-07 DIAGNOSIS — I729 Aneurysm of unspecified site: Secondary | ICD-10-CM

## 2017-07-07 LAB — CREATININE, SERUM
Creatinine, Ser: 0.76 mg/dL (ref 0.44–1.00)
GFR calc non Af Amer: >60 mL/min (ref >60)

## 2017-07-07 MED ORDER — GADOBENATE DIMEGLUMINE 529 MG/ML IV SOLN
15.0000 mL | Freq: Once | INTRAVENOUS | Status: AC | PRN
  Administered 2017-07-07: 15 mL via INTRAVENOUS

## 2017-07-11 ENCOUNTER — Encounter (HOSPITAL_COMMUNITY): Payer: Self-pay | Admitting: Emergency Medicine

## 2017-07-11 ENCOUNTER — Emergency Department (HOSPITAL_COMMUNITY): Payer: BLUE CROSS/BLUE SHIELD

## 2017-07-11 ENCOUNTER — Other Ambulatory Visit: Payer: Self-pay

## 2017-07-11 ENCOUNTER — Emergency Department (HOSPITAL_COMMUNITY)
Admission: EM | Admit: 2017-07-11 | Discharge: 2017-07-11 | Disposition: A | Discharge status: Home / Self Care | Payer: BLUE CROSS/BLUE SHIELD | Attending: Emergency Medicine | Admitting: Emergency Medicine

## 2017-07-11 DIAGNOSIS — X58XXXA Exposure to other specified factors, initial encounter: Secondary | ICD-10-CM | POA: Insufficient documentation

## 2017-07-11 DIAGNOSIS — E039 Hypothyroidism, unspecified: Secondary | ICD-10-CM | POA: Diagnosis not present

## 2017-07-11 DIAGNOSIS — Y939 Activity, unspecified: Secondary | ICD-10-CM | POA: Diagnosis not present

## 2017-07-11 DIAGNOSIS — S0502XA Injury of conjunctiva and corneal abrasion without foreign body, left eye, initial encounter: Secondary | ICD-10-CM | POA: Diagnosis not present

## 2017-07-11 DIAGNOSIS — I10 Essential (primary) hypertension: Secondary | ICD-10-CM | POA: Insufficient documentation

## 2017-07-11 DIAGNOSIS — Z79899 Other long term (current) drug therapy: Secondary | ICD-10-CM | POA: Insufficient documentation

## 2017-07-11 DIAGNOSIS — Z87891 Personal history of nicotine dependence: Secondary | ICD-10-CM | POA: Insufficient documentation

## 2017-07-11 DIAGNOSIS — Y999 Unspecified external cause status: Secondary | ICD-10-CM | POA: Insufficient documentation

## 2017-07-11 DIAGNOSIS — Y929 Unspecified place or not applicable: Secondary | ICD-10-CM | POA: Diagnosis not present

## 2017-07-11 DIAGNOSIS — H538 Other visual disturbances: Secondary | ICD-10-CM | POA: Diagnosis present

## 2017-07-11 LAB — DIFFERENTIAL
ABS IMMATURE GRANULOCYTES: 0 10*3/uL (ref 0.0–0.1)
BASOS ABS: 0 10*3/uL (ref 0.0–0.1)
Basophils Relative: 1 %
Eosinophils Absolute: 0.2 10*3/uL (ref 0.0–0.7)
Eosinophils Relative: 3 %
IMMATURE GRANULOCYTES: 0 %
LYMPHS ABS: 2 10*3/uL (ref 0.7–4.0)
LYMPHS PCT: 32 %
Monocytes Absolute: 0.6 10*3/uL (ref 0.1–1.0)
Monocytes Relative: 9 %
NEUTROS ABS: 3.4 10*3/uL (ref 1.7–7.7)
Neutrophils Relative %: 55 %

## 2017-07-11 LAB — CBG MONITORING, ED: Glucose-Capillary: 82 mg/dL (ref 65–99)

## 2017-07-11 LAB — I-STAT BETA HCG BLOOD, ED (MC, WL, AP ONLY): I-stat hCG, quantitative: <5 m[IU]/mL (ref <5)

## 2017-07-11 LAB — I-STAT CHEM 8, ED
BUN: 11 mg/dL (ref 6–20)
Calcium, Ion: 1.2 mmol/L (ref 1.15–1.40)
Chloride: 101 mmol/L (ref 101–111)
Creatinine, Ser: 0.6 mg/dL (ref 0.44–1.00)
Glucose, Bld: 91 mg/dL (ref 65–99)
HEMATOCRIT: 39 % (ref 36.0–46.0)
HEMOGLOBIN: 13.3 g/dL (ref 12.0–15.0)
POTASSIUM: 3.4 mmol/L — AB (ref 3.5–5.1)
SODIUM: 140 mmol/L (ref 135–145)
TCO2: 24 mmol/L (ref 22–32)

## 2017-07-11 LAB — PROTIME-INR
INR: 0.95
Prothrombin Time: 12.5 seconds (ref 11.4–15.2)

## 2017-07-11 LAB — COMPREHENSIVE METABOLIC PANEL
ALBUMIN: 4.2 g/dL (ref 3.5–5.0)
ALK PHOS: 49 U/L (ref 38–126)
ALT: 22 U/L (ref 14–54)
AST: 20 U/L (ref 15–41)
Anion gap: 8 (ref 5–15)
BILIRUBIN TOTAL: 0.6 mg/dL (ref 0.3–1.2)
BUN: 11 mg/dL (ref 6–20)
CALCIUM: 9.2 mg/dL (ref 8.9–10.3)
CO2: 25 mmol/L (ref 22–32)
Chloride: 105 mmol/L (ref 101–111)
Creatinine, Ser: 0.72 mg/dL (ref 0.44–1.00)
GFR calc Af Amer: >60 mL/min (ref >60)
GFR calc non Af Amer: >60 mL/min (ref >60)
GLUCOSE: 91 mg/dL (ref 65–99)
Potassium: 3.4 mmol/L — ABNORMAL LOW (ref 3.5–5.1)
SODIUM: 138 mmol/L (ref 135–145)
Total Protein: 7.4 g/dL (ref 6.5–8.1)

## 2017-07-11 LAB — I-STAT TROPONIN, ED: Troponin i, poc: 0 ng/mL (ref 0.00–0.08)

## 2017-07-11 LAB — CBC
HEMATOCRIT: 38.8 % (ref 36.0–46.0)
HEMOGLOBIN: 13.3 g/dL (ref 12.0–15.0)
MCH: 30.8 pg (ref 26.0–34.0)
MCHC: 34.3 g/dL (ref 30.0–36.0)
MCV: 89.8 fL (ref 78.0–100.0)
Platelets: 276 10*3/uL (ref 150–400)
RBC: 4.32 MIL/uL (ref 3.87–5.11)
RDW: 12.8 % (ref 11.5–15.5)
WBC: 6.3 10*3/uL (ref 4.0–10.5)

## 2017-07-11 LAB — APTT: APTT: 26 s (ref 24–36)

## 2017-07-11 MED ORDER — CIPROFLOXACIN HCL 0.3 % OP SOLN: OPHTHALMIC | 0 refills | Status: DC

## 2017-07-11 MED ORDER — FLUORESCEIN SODIUM 1 MG OP STRP
1.0000 | ORAL_STRIP | Freq: Once | OPHTHALMIC | Status: AC
  Administered 2017-07-11: 1 via OPHTHALMIC
  Filled 2017-07-11: qty 1

## 2017-07-11 MED ORDER — TETRACAINE HCL 0.5 % OP SOLN
2.0000 [drp] | Freq: Once | OPHTHALMIC | Status: AC
  Administered 2017-07-11: 2 [drp] via OPHTHALMIC
  Filled 2017-07-11: qty 4

## 2017-07-11 MED ORDER — CYCLOPENTOLATE HCL 1 % OP SOLN
2.0000 [drp] | Freq: Once | OPHTHALMIC | Status: AC
  Administered 2017-07-11: 2 [drp] via OPHTHALMIC
  Filled 2017-07-11: qty 2

## 2017-07-11 NOTE — ED Notes (Signed)
ED Provider at bedside. 

## 2017-07-11 NOTE — ED Notes (Signed)
R eye 20/25, L eye 20/125

## 2017-07-11 NOTE — ED Provider Notes (Signed)
Patient placed in Quick Look pathway, seen and evaluated   Chief Complaint:   HPI: Patient to ED c/o sudden onset blurred vision of L eye. 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 Patient denies headache, no speech changes, no extremity weakness.   ROS: Eyes: blurry vision left  Physical Exam:  BP (!) 183/107 (BP Location: Right Arm)   Pulse 68   Temp 97.7 F (36.5 C) (Oral)   Resp 14   Ht 5\' 2"  (1.575 m)   Wt 73.5 kg (162 lb)   SpO2 100%   BMI 29.63 kg/m    Gen: No distress  Neuro: Awake and Alert  Skin: Warm and dry  Eyes: good occular movement, left sclera injected   Will place patient in exam room AS soon as possible.  Initiation of care has begun. The patient has been counseled on the process, plan, and necessity for staying for the completion/evaluation, and the remainder of the medical screening examination    Ashley Murrain, NP 07/11/17 [place], [place], DO 07/11/17 2324

## 2017-07-11 NOTE — ED Provider Notes (Signed)
[facility] DEPARTMENT Provider Note   CSN: [id number] Arrival date & time: 07/11/17  1854     History   Chief Complaint Chief Complaint  Patient presents with  . Visual Field Change    HPI Kathy Chaney is a 42 y.o. female.  Pt presents to the ED today with blurry vision to left eye.  She said she was sitting at her desk today and felt like something got in her eye.  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  She has been getting annual MRAs to eval and just had one on 6/20.  She does not yet know the results.  The pt denies any n/v.  She feels like the entire visual field is blurry.  No difference with each quadrant.  She does not wear contacts.     Past Medical History:  Diagnosis Date  . 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 Headache(784.0)   . Hypertension   . Low back pain   . Tobacco abuse     Patient Active Problem List   Diagnosis Date Noted  . Allergic rhinitis, cause unspecified 10/04/2013  . Other abnormal glucose 04/10/2012  . Headache(784.0) 04/10/2012  . Hyperthyroidism 12/30/2011  . TOBACCO USE 02/19/2010  . Essential hypertension, benign 02/19/2010  . 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 HERNIA, UMBILICAL 95/62/1308  . Lumbago 02/19/2010    Past Surgical History:  Procedure Laterality Date  . ANEURYSM COILING  01/2010   basilar artery  . HERNIA REPAIR    . TUBAL LIGATION       OB History   None      Home Medications    Prior to Admission medications   Medication Sig Start Date End Date Taking? Authorizing Provider  acetaminophen (TYLENOL) 500 MG tablet Take 500 mg by mouth every 6 (six) hours as needed for moderate pain.    [provider]  ciprofloxacin (CILOXAN) 0.3 % ophthalmic solution Administer 2 drops to the left eye every 4 hours while awake for 5 days. 07/11/17   Isla Pence, MD  guaiFENesin-codeine 100-10 MG/5ML syrup Take 5 mLs by mouth 3  (three) times daily as needed for cough. 06/04/16   Barnet Glasgow, NP  phenazopyridine (PYRIDIUM) 200 MG tablet Take 1 tablet (200 mg total) by mouth 3 (three) times daily as needed for pain. 06/04/16   Barnet Glasgow, NP    Family History Family History  Adopted: Yes  Problem Relation Age of Onset  . Alcohol abuse Neg Hx   . Cancer Neg Hx   . Early death Neg Hx   . Heart disease Neg Hx   . Hyperlipidemia Neg Hx   . Hypertension Neg Hx   . Stroke Neg Hx     Social History Social History   Tobacco Use  . Smoking status: Former Smoker    Packs/day: 0.00    Years: 0.00    Pack years: 0.00    Types: Cigarettes  . Smokeless tobacco: Never Used  Substance Use Topics  . Alcohol use: Yes    Alcohol/week: 0.0 oz  . Drug use: No     Allergies   Amlodipine   Review of Systems Review of Systems  Eyes: Positive for pain, redness and visual disturbance.  All other systems reviewed and are negative.    Physical Exam Updated Vital Signs BP (!) 141/79   Pulse (!) 59   Temp 97.7 F (  36.5 C)   Resp 16   Ht 5\' 2"  (1.575 m)   Wt 73.5 kg (162 lb)   SpO2 97%   BMI 29.63 kg/m   Physical Exam  Constitutional: She is oriented to person, place, and time. She appears well-developed and well-nourished.  HENT:  Head: Normocephalic and atraumatic.  Right Ear: External ear normal.  Left Ear: External ear normal.  Nose: Nose normal.  Mouth/Throat: Oropharynx is clear and moist.  Eyes: Pupils are equal, round, and reactive to light. EOM and lids are normal. Left conjunctiva is injected.  Slit lamp exam:      The left eye shows corneal abrasion and fluorescein uptake.  Corneal abrasion at 3:00  Neck: Normal range of motion. Neck supple.  Cardiovascular: Normal rate, regular rhythm, normal heart sounds and intact distal pulses.  Pulmonary/Chest: Effort normal and breath sounds normal.  Abdominal: Soft. Bowel sounds are normal.  Musculoskeletal: Normal range of motion.    Neurological: She is alert and oriented to person, place, and time.  Skin: Skin is warm. Capillary refill takes less than 2 seconds.  Psychiatric: She has a normal mood and affect. Her behavior is normal. Judgment and thought content normal.  Nursing note and vitals reviewed.    ED Treatments / Results  Labs (all labs ordered are listed, but only abnormal results are displayed) Labs Reviewed  COMPREHENSIVE METABOLIC PANEL - Abnormal; Notable for the following components:      Result Value   Potassium 3.4 (*)    All other components within normal limits  I-STAT CHEM 8, ED - Abnormal; Notable for the following components:   Potassium 3.4 (*)    All other components within normal limits  PROTIME-INR  APTT  CBC  DIFFERENTIAL  I-STAT TROPONIN, ED  CBG MONITORING, ED  I-STAT BETA HCG BLOOD, ED ([facility], [facility], [facility] ONLY)    EKG EKG Interpretation  Date/Time:  Monday July 11 2017 20:00:20 EDT Ventricular Rate:  57 PR Interval:  182 QRS Duration: 80 QT Interval:  438 QTC Calculation: 426 R Axis:   21 Text Interpretation:  Sinus bradycardia Nonspecific T wave abnormality Abnormal ECG No significant change since last tracing Confirmed by Isla Pence [phone]) on 07/11/2017 8:31:22 PM   Radiology Ct Head Wo Contrast  Result Date: 07/11/2017 CLINICAL DATA:  Sudden onset blurry vision left eye. EXAM: CT HEAD WITHOUT CONTRAST TECHNIQUE: Contiguous axial images were obtained from the base of the skull through the vertex without intravenous contrast. COMPARISON:  MRI 07/07/2017 FINDINGS: BRAIN: The ventricles and sulci are normal. No intraparenchymal hemorrhage, mass effect nor midline shift. No acute large vascular territory infarcts. Grey-white matter distinction is maintained. The basal ganglia are unremarkable. No abnormal extra-axial fluid collections. Basal cisterns are not effaced and midline. The brainstem and cerebellar hemispheres are without acute abnormalities. VASCULAR: Metallic clip  artifact from prior basilar tip aneurysm repair. No hyperdense vessel sign. SKULL/SOFT TISSUES: No skull fracture. No significant soft tissue swelling. ORBITS/SINUSES: The included ocular globes and orbital contents are normal. Retrobulbar soft tissue abnormality. The extraocular muscles and optic nerves are symmetric in appearance bilaterally.The mastoid air cells are clear. Mild-to-moderate ethmoid sinus mucosal thickening. The sphenoid and frontal sinuses appear clear. Right maxillary sinus wall thickening compatible with changes of chronic right maxillary sinusitis. OTHER: None. IMPRESSION: 1. 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 2. Chronic right maxillary sinusitis. 3. No acute intracranial abnormality. Electronically Signed   By: Ashley Royalty M.D.   On: 07/11/2017 21:17  Procedures Procedures (including critical care time)  Medications Ordered in ED Medications  cyclopentolate (CYCLODRYL,CYCLOGYL) 1 % ophthalmic solution 2 drop (has no administration in time range)  fluorescein ophthalmic strip 1 strip (1 strip Left Eye Given by Other 07/11/17 2116)  tetracaine (PONTOCAINE) 0.5 % ophthalmic solution 2 drop (2 drops Left Eye Given by Other 07/11/17 2116)     Initial Impression / Assessment and Plan / ED Course  I have reviewed the triage vital signs and the nursing notes.  Pertinent labs & imaging results that were available during my care of the patient were reviewed by me and considered in my medical decision making (see chart for details).    No evidence of aneurysm rupture.  Pt does have a large corneal abrasion.  She is encouraged to f/u with her eye doctor.  Return if worse.  Final Clinical Impressions(s) / ED Diagnoses   Final diagnoses:  Abrasion of left cornea, initial encounter    ED Discharge Orders        Ordered    ciprofloxacin (CILOXAN) 0.3 % ophthalmic solution     07/11/17 2146       Isla Pence, MD 07/11/17 2153

## 2017-07-11 NOTE — ED Notes (Signed)
Pt upset and stating she was told to come in by her Doctor and that she was not to wait b/c there is already an aneurysm and she doesn't want it to bust.

## 2017-07-11 NOTE — ED Triage Notes (Signed)
Patient to ED c/o sudden onset blurred vision of L eye. 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 Patient denies headache, no speech changes, no extremity weakness. Last routine CT scan on Thursday - patient has not gotten results back yet. A&O x 4.

## 2017-07-13 ENCOUNTER — Telehealth (HOSPITAL_COMMUNITY): Payer: Self-pay | Admitting: Radiology

## 2017-07-13 NOTE — Telephone Encounter (Signed)
Pt called and stated that she had her f/u MRA/MRI brain last week and still is awaiting her results. She had a sudden onset of blurry vision in her left eye and came to the ED on 07/11/17. A CT scan was performed. She states that they told her she had a scratched cornea. She said they dilated her pupil and it is still dilated several days later. I told her that I she should contact her eye doctor and that I would pass this along to Dr. Estanislado Pandy as well. Gave note to [facility] 07/13/17 @ 1030. JM

## 2017-07-14 ENCOUNTER — Other Ambulatory Visit (HOSPITAL_COMMUNITY): Payer: Self-pay | Admitting: Interventional Radiology

## 2017-07-14 DIAGNOSIS — I671 Cerebral aneurysm, nonruptured: Secondary | ICD-10-CM

## 2017-08-05 ENCOUNTER — Ambulatory Visit (HOSPITAL_COMMUNITY)
Admission: RE | Admit: 2017-08-05 | Discharge: 2017-08-05 | Disposition: A | Discharge status: Home / Self Care | Payer: BLUE CROSS/BLUE SHIELD | Source: Ambulatory Visit | Attending: Interventional Radiology | Admitting: Interventional Radiology

## 2017-08-05 DIAGNOSIS — I671 Cerebral aneurysm, nonruptured: Secondary | ICD-10-CM

## 2017-08-05 NOTE — Sedation Documentation (Signed)
The VS charted below are not for this pt- charted by mistake on this pt

## 2017-08-05 NOTE — Consult Note (Signed)
Chief Complaint: Patient was seen in consultation today for basilar apex aneurysm s/p embolization.  Supervising Physician: Luanne Bras  Patient Status: [facility] - Out-pt  History of Present Illness: Kathy Chaney is a 42 y.o. female with a past medical history of hypertension, low back pain, and tobacco abuse. She is known to [facility] and has been followed by Dr. Estanislado Pandy since 01/2010. 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 Estanislado Pandy. She has been monitored with routine diagnostic cerebral angiograms and MRI/MRA scans since.  MRI/MRA brain/head 07/07/2017: 1. MRI brain is within normal limits. No acute or focal intracranial abnormality. No abnormal postcontrast enhancement. 2. Persistent 2 mm area of flow at the neck of the previously treated basilar tip aneurysm, stable from priors. Unchanged RIGHT PCOM Infundibulum.  Patient presents today for follow-up to discuss her recent MRI/MRA 07/07/2017. Patient awake and alert sitting in chair. Complains of left posterior neck pain when she turns her head to the left with associated tingling of left face/posterior neck x 3-4 days. States she is curious if this is related to aneurysm. Complains of blurred vision of left eye. States this has occurred for years and is stable at this time. Denies headache, weakness, numbness, dizziness, diplopia, hearing changes, tinnitus, or speech difficulty.   Past Medical History:  Diagnosis Date  . 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 Headache(784.0)   . Hypertension   . Low back pain   . Tobacco abuse     Past Surgical History:  Procedure Laterality Date  . ANEURYSM COILING  01/2010   basilar artery  . HERNIA REPAIR    . TUBAL LIGATION      Allergies: Amlodipine  Medications: Prior to Admission medications   Medication Sig Start Date End Date Taking? Authorizing Provider  acetaminophen (TYLENOL) 500 MG  tablet Take 500 mg by mouth every 6 (six) hours as needed for moderate pain.    [provider]  ciprofloxacin (CILOXAN) 0.3 % ophthalmic solution Administer 2 drops to the left eye every 4 hours while awake for 5 days. 07/11/17   Isla Pence, MD  guaiFENesin-codeine 100-10 MG/5ML syrup Take 5 mLs by mouth 3 (three) times daily as needed for cough. 06/04/16   Barnet Glasgow, NP  phenazopyridine (PYRIDIUM) 200 MG tablet Take 1 tablet (200 mg total) by mouth 3 (three) times daily as needed for pain. 06/04/16   Barnet Glasgow, NP     Family History  Adopted: Yes  Problem Relation Age of Onset  . Alcohol abuse Neg Hx   . Cancer Neg Hx   . Early death Neg Hx   . Heart disease Neg Hx   . Hyperlipidemia Neg Hx   . Hypertension Neg Hx   . Stroke Neg Hx     Social History   Socioeconomic History  . Marital status: Married    Spouse name: Not on file  . Number of children: Not on file  . Years of education: Not on file  . Highest education level: Not on file  Occupational History  . Not on file  Social Needs  . Financial resource strain: Not on file  . Food insecurity:    Worry: Not on file    Inability: Not on file  . Transportation needs:    Medical: Not on file    Non-medical: Not on file  Tobacco Use  . Smoking status: Former Smoker    Packs/day: 0.00  Years: 0.00    Pack years: 0.00    Types: Cigarettes  . Smokeless tobacco: Never Used  Substance and Sexual Activity  . Alcohol use: Yes    Alcohol/week: 0.0 oz  . Drug use: No  . Sexual activity: Not on file  Lifestyle  . Physical activity:    Days per week: Not on file    Minutes per session: Not on file  . Stress: Not on file  Relationships  . Social connections:    Talks on phone: Not on file    Gets together: Not on file    Attends religious service: Not on file    Active member of club or organization: Not on file    Attends meetings of clubs or organizations: Not on file    Relationship  status: Not on file  Other Topics Concern  . Not on file  Social History Narrative  . Not on file     Review of Systems: A 12 point ROS discussed and pertinent positives are indicated in the HPI above.  All other systems are negative.  Review of Systems  Constitutional: Negative for chills and fever.  HENT: Negative for hearing loss and tinnitus.   Eyes: Positive for visual disturbance.  Respiratory: Negative for shortness of breath and wheezing.   Cardiovascular: Negative for chest pain and palpitations.  Neurological: Negative for dizziness, speech difficulty, weakness, numbness and headaches.       Positive for tingling.  Psychiatric/Behavioral: Negative for behavioral problems and confusion.    Vital Signs: There were no vitals taken for this visit.  Physical Exam  Constitutional: She is oriented to person, place, and time. She appears well-developed and well-nourished. No distress.  Pulmonary/Chest: Effort normal. No respiratory distress.  Neurological: She is alert and oriented to person, place, and time.  Skin: Skin is warm and dry.  Psychiatric: She has a normal mood and affect. Her behavior is normal. Judgment and thought content normal.  Nursing note and vitals reviewed.    Imaging: Ct Head Wo Contrast  Result Date: 07/11/2017 CLINICAL DATA:  Sudden onset blurry vision left eye. EXAM: CT HEAD WITHOUT CONTRAST TECHNIQUE: Contiguous axial images were obtained from the base of the skull through the vertex without intravenous contrast. COMPARISON:  MRI 07/07/2017 FINDINGS: BRAIN: The ventricles and sulci are normal. No intraparenchymal hemorrhage, mass effect nor midline shift. No acute large vascular territory infarcts. Grey-white matter distinction is maintained. The basal ganglia are unremarkable. No abnormal extra-axial fluid collections. Basal cisterns are not effaced and midline. The brainstem and cerebellar hemispheres are without acute abnormalities. VASCULAR:  Metallic clip artifact from prior basilar tip aneurysm repair. No hyperdense vessel sign. SKULL/SOFT TISSUES: No skull fracture. No significant soft tissue swelling. ORBITS/SINUSES: The included ocular globes and orbital contents are normal. Retrobulbar soft tissue abnormality. The extraocular muscles and optic nerves are symmetric in appearance bilaterally.The mastoid air cells are clear. Mild-to-moderate ethmoid sinus mucosal thickening. The sphenoid and frontal sinuses appear clear. Right maxillary sinus wall thickening compatible with changes of chronic right maxillary sinusitis. OTHER: None. IMPRESSION: 1. 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 2. Chronic right maxillary sinusitis. 3. No acute intracranial abnormality. Electronically Signed   By: Ashley Royalty M.D.   On: 07/11/2017 21:17   Mr Jodene Nam Head Wo Contrast  Result Date: 07/07/2017 CLINICAL DATA:  Basilar tip aneurysm. Continued surveillance. Post coiling. EXAM: MRI HEAD WITHOUT AND WITH CONTRAST MRA HEAD WITHOUT CONTRAST TECHNIQUE: Multiplanar, multiecho pulse sequences of the brain and surrounding  structures were obtained without and with intravenous contrast. Angiographic images of the head were obtained using MRA technique without contrast. CONTRAST:  74mL MULTIHANCE GADOBENATE DIMEGLUMINE 529 MG/ML IV SOLN COMPARISON:  07/09/2016. FINDINGS: MRI HEAD FINDINGS Brain: No evidence for acute infarction, hemorrhage, mass lesion, hydrocephalus, or extra-axial fluid. Normal cerebral volume. No significant white matter disease. Post infusion, no abnormal enhancement of the brain or meninges. Vascular: Reported separately. Skull and upper cervical spine: Negative. Sinuses/Orbits: Mild chronic sinus disease.  Negative orbits. Other: None. MRA HEAD FINDINGS Both internal carotid arteries are widely patent. The basilar artery is widely patent both vertebrals contributing, RIGHT dominant. At the site of the previous basilar tip aneurysm coiling, there  is a 2 x 2 mm area persistent flow related enhancement, reference axial source image 95 series 1. This appears unchanged from priors, both on projection imaging as well as individual axial source images. Also unchanged is a small 2 mm infundibulum projecting from the RIGHT posterior communicating artery at its origin from the ICA. No intracranial stenosis or dissection.  No new saccular aneurysms. IMPRESSION: MRI brain is within normal limits. No acute or focal intracranial abnormality. No abnormal postcontrast enhancement. Persistent 2 mm area of flow at the neck of the previously treated basilar tip aneurysm, stable from priors. Unchanged RIGHT PCOM infundibulum. Electronically Signed   By: Staci Righter M.D.   On: 07/07/2017 16:53   Mr Jeri Cos AT Contrast  Result Date: 07/07/2017 CLINICAL DATA:  Basilar tip aneurysm. Continued surveillance. Post coiling. EXAM: MRI HEAD WITHOUT AND WITH CONTRAST MRA HEAD WITHOUT CONTRAST TECHNIQUE: Multiplanar, multiecho pulse sequences of the brain and surrounding structures were obtained without and with intravenous contrast. Angiographic images of the head were obtained using MRA technique without contrast. CONTRAST:  82mL MULTIHANCE GADOBENATE DIMEGLUMINE 529 MG/ML IV SOLN COMPARISON:  07/09/2016. FINDINGS: MRI HEAD FINDINGS Brain: No evidence for acute infarction, hemorrhage, mass lesion, hydrocephalus, or extra-axial fluid. Normal cerebral volume. No significant white matter disease. Post infusion, no abnormal enhancement of the brain or meninges. Vascular: Reported separately. Skull and upper cervical spine: Negative. Sinuses/Orbits: Mild chronic sinus disease.  Negative orbits. Other: None. MRA HEAD FINDINGS Both internal carotid arteries are widely patent. The basilar artery is widely patent both vertebrals contributing, RIGHT dominant. At the site of the previous basilar tip aneurysm coiling, there is a 2 x 2 mm area persistent flow related enhancement, reference  axial source image 95 series 1. This appears unchanged from priors, both on projection imaging as well as individual axial source images. Also unchanged is a small 2 mm infundibulum projecting from the RIGHT posterior communicating artery at its origin from the ICA. No intracranial stenosis or dissection.  No new saccular aneurysms. IMPRESSION: MRI brain is within normal limits. No acute or focal intracranial abnormality. No abnormal postcontrast enhancement. Persistent 2 mm area of flow at the neck of the previously treated basilar tip aneurysm, stable from priors. Unchanged RIGHT PCOM infundibulum. Electronically Signed   By: Staci Righter M.D.   On: 07/07/2017 16:53    Labs:  CBC: Recent Labs    07/11/17 2010 07/11/17 2021  WBC 6.3  --   HGB 13.3 13.3  HCT 38.8 39.0  PLT 276  --     COAGS: Recent Labs    07/11/17 2010  INR 0.95  APTT 26    BMP: Recent Labs    07/07/17 1510 07/11/17 2010 07/11/17 2021  NA  --  138 140  K  --  3.4*  3.4*  CL  --  105 101  CO2  --  25  --   GLUCOSE  --  91 91  BUN  --  11 11  CALCIUM  --  9.2  --   CREATININE 0.76 0.72 0.60  GFRNONAA >60 >60  --   GFRAA >60 >60  --     LIVER FUNCTION TESTS: Recent Labs    07/11/17 2010  BILITOT 0.6  AST 20  ALT 22  ALKPHOS 49  PROT 7.4  ALBUMIN 4.2    TUMOR MARKERS: No results for input(s): AFPTM, CEA, CA199, CHROMGRNA in the last 8760 hours.  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 Estanislado Pandy. Reviewed imaging with patient. Informed patient that her aneurysm is stable at this time. Explained that the best course of management for her aneurysm is with monitoring with routine imaging scans.  Plan for follow-up with MRI/MRA brain/head (w/o contrast) in 1 year. Informed patient that our schedulers will call her to set up this imaging scan.  Informed patient that her neck pain/tingling is not related to her aneurysm. Encouraged patient to see her PCP  regarding this for management with possible cervical imaging.  All questions answered and concerns addressed. Patient conveys understanding and agrees with plan.  Thank you for this interesting consult.  I greatly enjoyed meeting KHELANI KOPS and look forward to participating in their care.  A copy of this report was sent to the requesting provider on this date.  Electronically Signed: Earley Abide, PA-C 08/05/2017, 10:35 AM   I spent a total of 25 Minutes in face to face in clinical consultation, greater than 50% of which was counseling/coordinating care for basilar apex aneurysm s/p embolization.

## 2017-12-05 ENCOUNTER — Encounter (HOSPITAL_COMMUNITY): Payer: Self-pay

## 2017-12-05 ENCOUNTER — Ambulatory Visit (HOSPITAL_COMMUNITY)
Admission: EM | Admit: 2017-12-05 | Discharge: 2017-12-05 | Disposition: A | Discharge status: Home / Self Care | Payer: BLUE CROSS/BLUE SHIELD | Attending: Family Medicine | Admitting: Family Medicine

## 2017-12-05 DIAGNOSIS — R509 Fever, unspecified: Secondary | ICD-10-CM | POA: Insufficient documentation

## 2017-12-05 DIAGNOSIS — Z79899 Other long term (current) drug therapy: Secondary | ICD-10-CM | POA: Diagnosis not present

## 2017-12-05 DIAGNOSIS — Z87891 Personal history of nicotine dependence: Secondary | ICD-10-CM | POA: Insufficient documentation

## 2017-12-05 DIAGNOSIS — I10 Essential (primary) hypertension: Secondary | ICD-10-CM

## 2017-12-05 DIAGNOSIS — M545 Low back pain: Secondary | ICD-10-CM | POA: Insufficient documentation

## 2017-12-05 LAB — POCT URINALYSIS DIP (DEVICE)
BILIRUBIN URINE: NEGATIVE
Glucose, UA: NEGATIVE mg/dL
Ketones, ur: NEGATIVE mg/dL
NITRITE: NEGATIVE
PH: 7 (ref 5.0–8.0)
Protein, ur: NEGATIVE mg/dL
Specific Gravity, Urine: 1.02 (ref 1.005–1.030)
UROBILINOGEN UA: 0.2 mg/dL (ref 0.0–1.0)

## 2017-12-05 NOTE — ED Provider Notes (Signed)
[facility]    CSN: [id number] Arrival date & time: 12/05/17  1827     History   Chief Complaint Chief Complaint  Patient presents with  . Fever    HPI CHAUNICE OBIE is a 42 y.o. female.   Patient had a birthday party Friday night and drunk lots of alcohol had inserted a tampon that same night and lifted in an extra day.  Today she noted onset of fever no rashes no abdominal pain.  She has a history of aneurysm with insertion of the colon she takes atenolol for blood pressure normally is well controlled blood pressure is elevated at night.  This makes TSS unlikely.  HPI  Past Medical History:  Diagnosis Date  . 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 Headache(784.0)   . Hypertension   . Low back pain   . Tobacco abuse     Patient Active Problem List   Diagnosis Date Noted  . Allergic rhinitis, cause unspecified 10/04/2013  . Other abnormal glucose 04/10/2012  . Headache(784.0) 04/10/2012  . Hyperthyroidism 12/30/2011  . TOBACCO USE 02/19/2010  . Essential hypertension, benign 02/19/2010  . 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 HERNIA, UMBILICAL 77/41/2878  . Lumbago 02/19/2010    Past Surgical History:  Procedure Laterality Date  . ANEURYSM COILING  01/2010   basilar artery  . HERNIA REPAIR    . TUBAL LIGATION      OB History   None      Home Medications    Prior to Admission medications   Medication Sig Start Date End Date Taking? Authorizing Provider  acetaminophen (TYLENOL) 500 MG tablet Take 500 mg by mouth every 6 (six) hours as needed for moderate pain.    [provider]  ciprofloxacin (CILOXAN) 0.3 % ophthalmic solution Administer 2 drops to the left eye every 4 hours while awake for 5 days. 07/11/17   Isla Pence, MD  guaiFENesin-codeine 100-10 MG/5ML syrup Take 5 mLs by mouth 3 (three) times daily as needed for cough. 06/04/16   Barnet Glasgow, NP  phenazopyridine (PYRIDIUM) 200 MG tablet Take 1 tablet (200 mg  total) by mouth 3 (three) times daily as needed for pain. 06/04/16   Barnet Glasgow, NP    Family History Family History  Adopted: Yes  Problem Relation Age of Onset  . Alcohol abuse Neg Hx   . Cancer Neg Hx   . Early death Neg Hx   . Heart disease Neg Hx   . Hyperlipidemia Neg Hx   . Hypertension Neg Hx   . Stroke Neg Hx     Social History Social History   Tobacco Use  . Smoking status: Former Smoker    Packs/day: 0.00    Years: 0.00    Pack years: 0.00    Types: Cigarettes  . Smokeless tobacco: Never Used  Substance Use Topics  . Alcohol use: Yes    Alcohol/week: 0.0 standard drinks  . Drug use: No     Allergies   Other and Amlodipine   Review of Systems Review of Systems  Constitutional: Positive for fever.  HENT: Negative.   Respiratory: Negative.   Cardiovascular: Negative.   Gastrointestinal: Negative.   Musculoskeletal: Negative.   Neurological: Negative.   Psychiatric/Behavioral: Negative.      Physical Exam Triage Vital Signs ED Triage Vitals  Enc Vitals Group     BP 12/05/17 1926 (!) 158/103     Pulse Rate 12/05/17 1926 (!) 120  Resp 12/05/17 1926 20     Temp 12/05/17 1926 98 F (36.7 C)     Temp Source 12/05/17 1926 Oral     SpO2 12/05/17 1926 95 %     Weight --      Height --      Head Circumference --      Peak Flow --      Pain Score 12/05/17 1928 2     Pain Loc --      Pain Edu? --      Excl. in [place]? --    No data found.  Updated Vital Signs BP (!) 158/103 (BP Location: Left Arm)   Pulse (!) 120   Temp 98 F (36.7 C) (Oral)   Resp 20   SpO2 95%   Visual Acuity Right Eye Distance:   Left Eye Distance:   Bilateral Distance:    Right Eye Near:   Left Eye Near:    Bilateral Near:     Physical Exam  Constitutional: She is oriented to person, place, and time. She appears well-nourished.  HENT:  Head: Normocephalic.  Mouth/Throat: Oropharynx is clear and moist.  Cardiovascular: Normal rate and regular rhythm.    Pulmonary/Chest: Effort normal and breath sounds normal.  Abdominal: Soft.  Neurological: She is alert and oriented to person, place, and time.  Skin: No rash noted.  Psychiatric: She has a normal mood and affect. Her behavior is normal.     UC Treatments / Results  Labs (all labs ordered are listed, but only abnormal results are displayed) Labs Reviewed - No data to display  EKG None  Radiology No results found.  Procedures Procedures (including critical care time)  Medications Ordered in UC Medications - No data to display  Initial Impression / Assessment and Plan / UC Course  I have reviewed the triage vital signs and the nursing notes.  Pertinent labs & imaging results that were available during my care of the patient were reviewed by me and considered in my medical decision making (see chart for details).     Fever of uncertain etiology.  Patient most concerned about toxic shock syndrome with retention of tampon but she has no abdominal pain no rashes and is not hypotensive.  No other obvious sources of fever.  Urine analysis is pending Patient is also on once a day atenolol.  Explained that this may not be a true 24-hour medicine and indeed she takes her medicine at night which is good but when she took it last night at bedtime it has worn off by this time of day and her pressure is elevated now.  Have advised her to break the atenolol in half and take half morning and half in the evening I would question whether or not she really has true beta-blockade with pulse rate of 120 as well Final Clinical Impressions(s) / UC Diagnoses   Final diagnoses:  None   Discharge Instructions   None    ED Prescriptions    None     Controlled Substance Prescriptions [place] Controlled Substance Registry consulted? No   Wardell Honour, MD 12/05/17 2015

## 2017-12-05 NOTE — ED Triage Notes (Signed)
Pt presents with ongoing fever since Sunday.  Pt states she had her tampon in on Saturday and didn't remember to take it out until Sunday evening .

## 2017-12-07 ENCOUNTER — Telehealth (HOSPITAL_COMMUNITY): Payer: Self-pay | Admitting: Emergency Medicine

## 2017-12-07 LAB — URINE CULTURE: Culture: NO GROWTH

## 2017-12-07 NOTE — Telephone Encounter (Signed)
Pt called asking about her test restuls and code for mychart. All questions answered.

## 2018-01-05 ENCOUNTER — Telehealth: Payer: Self-pay | Admitting: Physician Assistant

## 2018-01-05 NOTE — Progress Notes (Signed)
  Patient c/o pulsating headache  History of cerebral aneurysm repair.  Known hypertension.  States BP has been increased over the past few days.  She saw her PCP yesterday and a second medication was added.  She is now on Atenolol and "a water pill".  If this does not improve her BP, she may need to consider another type of medication.  Discussed with Dr. Estanislado Pandy.  He reviewed most recent imaging. No change aneurysm.  Recommend following PCP recommendations.  If her BP is controlled and she is continuing to have symptoms, will consider re-imaging.  Kathy Chaney S Kathy Manuele PA-C 01/05/2018 4:00 PM

## 2018-01-13 ENCOUNTER — Emergency Department (HOSPITAL_COMMUNITY)
Admission: EM | Admit: 2018-01-13 | Discharge: 2018-01-14 | Disposition: A | Discharge status: Home / Self Care | Payer: BLUE CROSS/BLUE SHIELD | Attending: Emergency Medicine | Admitting: Emergency Medicine

## 2018-01-13 ENCOUNTER — Encounter (HOSPITAL_COMMUNITY): Payer: Self-pay | Admitting: Emergency Medicine

## 2018-01-13 DIAGNOSIS — Z79899 Other long term (current) drug therapy: Secondary | ICD-10-CM | POA: Diagnosis not present

## 2018-01-13 DIAGNOSIS — R51 Headache: Secondary | ICD-10-CM | POA: Diagnosis not present

## 2018-01-13 DIAGNOSIS — Z87891 Personal history of nicotine dependence: Secondary | ICD-10-CM | POA: Insufficient documentation

## 2018-01-13 DIAGNOSIS — R079 Chest pain, unspecified: Secondary | ICD-10-CM | POA: Diagnosis present

## 2018-01-13 DIAGNOSIS — I10 Essential (primary) hypertension: Secondary | ICD-10-CM | POA: Diagnosis not present

## 2018-01-13 NOTE — ED Triage Notes (Signed)
Reports playing video games tonight when she suddenly felt sick with pressure in left side of chest.  Reports eye felt funny on left side as well and that her pupil was dilated on the left and small on right.  Hx of aneurysm.  Also reports bp was elevated two weeks ago and finally went back down this past Sunday.

## 2018-01-14 ENCOUNTER — Emergency Department (HOSPITAL_COMMUNITY): Payer: BLUE CROSS/BLUE SHIELD

## 2018-01-14 LAB — BASIC METABOLIC PANEL
Anion gap: 10 (ref 5–15)
BUN: 9 mg/dL (ref 6–20)
CALCIUM: 9.8 mg/dL (ref 8.9–10.3)
CO2: 30 mmol/L (ref 22–32)
CREATININE: 0.79 mg/dL (ref 0.44–1.00)
Chloride: 100 mmol/L (ref 98–111)
GFR calc non Af Amer: >60 mL/min (ref >60)
GLUCOSE: 100 mg/dL — AB (ref 70–99)
Potassium: 4 mmol/L (ref 3.5–5.1)
Sodium: 140 mmol/L (ref 135–145)

## 2018-01-14 LAB — CBC
HCT: 41.4 % (ref 36.0–46.0)
Hemoglobin: 13.7 g/dL (ref 12.0–15.0)
MCH: 30.3 pg (ref 26.0–34.0)
MCHC: 33.1 g/dL (ref 30.0–36.0)
MCV: 91.6 fL (ref 80.0–100.0)
NRBC: 0 % (ref 0.0–0.2)
PLATELETS: 338 10*3/uL (ref 150–400)
RBC: 4.52 MIL/uL (ref 3.87–5.11)
RDW: 12.6 % (ref 11.5–15.5)
WBC: 8 10*3/uL (ref 4.0–10.5)

## 2018-01-14 LAB — I-STAT TROPONIN, ED
Troponin i, poc: 0 ng/mL (ref 0.00–0.08)
Troponin i, poc: 0 ng/mL (ref 0.00–0.08)

## 2018-01-14 LAB — I-STAT BETA HCG BLOOD, ED (MC, WL, AP ONLY): I-stat hCG, quantitative: <5 m[IU]/mL (ref <5)

## 2018-01-14 NOTE — ED Provider Notes (Signed)
[facility] EMERGENCY DEPARTMENT Provider Note   CSN: [id number] Arrival date & time: 01/13/18  2310     History   Chief Complaint Chief Complaint  Patient presents with  . Chest Pain    HPI Kathy Chaney is a 42 y.o. female.  The history is provided by the patient and medical records. No language interpreter was used.  Chest Pain   Associated symptoms include diaphoresis and nausea. Pertinent negatives include no abdominal pain, no palpitations and no vomiting.   Kathy Chaney is a 42 y.o. female  with a PMH of HTN, prior aneurysm s/p basilar coiling in 2012 who presents to the Emergency Department complaining of acute onset of sweating and chest pressure. Also notes left-sided arm pain. She initially felt as if she was having a hot flash, therefore went to get some water. While getting water, she noticed her left pupil was much bigger than her right. She reports left pupil is usually a little bigger than the right at baseline due to her aneurysm, however it was much larger today.  She has never had this happen before.  Symptoms were constant for about 30 minutes, but have since resolved.  She has taken no medication prior to arrival.  Denies any history of similar.  She had been waiting for about 6-1/2 hours prior to my evaluation and states that she has been chest pain-free for nearly her whole wait.  Denies any shortness of breath.  No headaches, weakness, numbness, slurred speech or gait abnormalities.  Does have hypertension.  Reports that her blood pressure have been quite elevated a few weeks ago, therefore she saw her primary care doctor who put her on a second blood pressure agent which has helped.  No history of hyperlipidemia, diabetes or heart failure.  She is adopted, therefore does not know her family cardiac history.  Former smoker.  No recent surgeries/immobilizations/travel.  No lower extremity swelling or pain.  Not on OCPs.  Past Medical History:    Diagnosis Date  . 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 Headache(784.0)   . Hypertension   . Low back pain   . Tobacco abuse     Patient Active Problem List   Diagnosis Date Noted  . Allergic rhinitis, cause unspecified 10/04/2013  . Other abnormal glucose 04/10/2012  . Headache(784.0) 04/10/2012  . Hyperthyroidism 12/30/2011  . TOBACCO USE 02/19/2010  . Essential hypertension, benign 02/19/2010  . 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 HERNIA, UMBILICAL 71/06/2692  . Lumbago 02/19/2010    Past Surgical History:  Procedure Laterality Date  . ANEURYSM COILING  01/2010   basilar artery  . HERNIA REPAIR    . TUBAL LIGATION       OB History   No obstetric history on file.      Home Medications    Prior to Admission medications   Medication Sig Start Date End Date Taking? Authorizing Provider  atenolol (TENORMIN) 50 MG tablet Take 50 mg by mouth every evening.   Yes [provider]  hydrochlorothiazide (HYDRODIURIL) 25 MG tablet Take 25 mg by mouth daily.   Yes [provider]  pantoprazole (PROTONIX) 20 MG tablet Take 40 mg by mouth daily. 10/07/17  Yes [provider]  ciprofloxacin (CILOXAN) 0.3 % ophthalmic solution Administer 2 drops to the left eye every 4 hours while awake for 5 days. Patient not taking: Reported on 01/14/2018 07/11/17   Isla Pence, MD  guaiFENesin-codeine 100-10 MG/5ML syrup  Take 5 mLs by mouth 3 (three) times daily as needed for cough. Patient not taking: Reported on 01/14/2018 06/04/16   Barnet Glasgow, NP  phenazopyridine (PYRIDIUM) 200 MG tablet Take 1 tablet (200 mg total) by mouth 3 (three) times daily as needed for pain. Patient not taking: Reported on 01/14/2018 06/04/16   Barnet Glasgow, NP    Family History Family History  Adopted: Yes  Problem Relation Age of Onset  . Alcohol abuse Neg Hx   . Cancer Neg Hx   . Early death Neg Hx   . Heart disease Neg Hx   . Hyperlipidemia Neg Hx   .  Hypertension Neg Hx   . Stroke Neg Hx     Social History Social History   Tobacco Use  . Smoking status: Former Smoker    Packs/day: 0.00    Years: 0.00    Pack years: 0.00    Types: Cigarettes  . Smokeless tobacco: Never Used  Substance Use Topics  . Alcohol use: Yes    Alcohol/week: 0.0 standard drinks  . Drug use: No     Allergies   Other and Amlodipine   Review of Systems Review of Systems  Constitutional: Positive for diaphoresis.  Cardiovascular: Positive for chest pain. Negative for palpitations and leg swelling.  Gastrointestinal: Positive for nausea. Negative for abdominal pain, constipation, diarrhea and vomiting.  All other systems reviewed and are negative.    Physical Exam Updated Vital Signs BP 112/74   Pulse (!) 53   Temp 97.6 F (36.4 C) (Oral)   Resp 13   Ht 5\' 2"  (1.575 m)   Wt 68.5 kg   SpO2 97%   BMI 27.62 kg/m   Physical Exam Vitals signs and nursing note reviewed.  Constitutional:      General: She is not in acute distress.    Appearance: She is well-developed.  HENT:     Head: Normocephalic and atraumatic.  Cardiovascular:     Rate and Rhythm: Normal rate and regular rhythm.     Heart sounds: Normal heart sounds. No murmur.  Pulmonary:     Effort: Pulmonary effort is normal. No respiratory distress.     Breath sounds: Normal breath sounds.  Chest:     Chest wall: No tenderness.  Abdominal:     General: There is no distension.     Palpations: Abdomen is soft.     Tenderness: There is no abdominal tenderness.  Musculoskeletal: Normal range of motion.     Right lower leg: No edema.     Left lower leg: No edema.  Skin:    General: Skin is warm and dry.  Neurological:     Mental Status: She is alert and oriented to person, place, and time.     Comments: Alert, oriented, thought content appropriate, able to give a coherent history. Speech is clear and goal oriented, able to follow commands.  Cranial Nerves:  II:  Left pupil  slightly larger than right 12mm and 58mm respectively. III, IV, VI: EOM intact bilaterally, ptosis not present V,VII: smile symmetric, eyes kept closed tightly against resistance, facial light touch sensation equal VIII: hearing grossly normal IX, X: symmetric soft palate movement, uvula elevates symmetrically  XI: bilateral shoulder shrug symmetric and strong XII: midline tongue extension 5/5 muscle strength in upper and lower extremities bilaterally including strong and equal grip strength and dorsiflexion/plantar flexion Sensory to light touch normal in all four extremities.  normal gait and balance.      ED  Treatments / Results  Labs (all labs ordered are listed, but only abnormal results are displayed) Labs Reviewed  BASIC METABOLIC PANEL - Abnormal; Notable for the following components:      Result Value   Glucose, Bld 100 (*)    All other components within normal limits  CBC  I-STAT TROPONIN, ED  I-STAT BETA HCG BLOOD, ED ([facility], [facility], [facility] ONLY)  I-STAT TROPONIN, ED    EKG EKG Interpretation  Date/Time:  Saturday January 14 2018 06:38:06 EST Ventricular Rate:  59 PR Interval:    QRS Duration: 99 QT Interval:  432 QTC Calculation: 428 R Axis:   40 Text Interpretation:  Sinus rhythm Prolonged PR interval Nonspecific T abnormalities, diffuse leads new T wave changes Confirmed by Davonna Belling [phone]) on 01/14/2018 6:52:02 AM   Radiology Dg Chest 2 View  Result Date: 01/14/2018 CLINICAL DATA:  Acute onset of left-sided chest pressure and left-sided pupillary dilatation. EXAM: CHEST - 2 VIEW COMPARISON:  Chest radiograph performed 03/20/2014 FINDINGS: The lungs are well-aerated and clear. There is no evidence of focal opacification, pleural effusion or pneumothorax. The heart is normal in size; the mediastinal contour is within normal limits. No acute osseous abnormalities are seen. IMPRESSION: No acute cardiopulmonary process seen. Electronically Signed   By: Garald Balding M.D.   On: 01/14/2018 00:27   Ct Head Wo Contrast  Result Date: 01/14/2018 CLINICAL DATA:  Acute onset of nausea and diaphoresis that began at approximately 5 o'clock p.m. yesterday associated with LEFT UPPER extremity pain and numbness and LEFT pupil dilation. 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 EXAM: CT HEAD WITHOUT CONTRAST TECHNIQUE: Contiguous axial images were obtained from the base of the skull through the vertex without intravenous contrast. COMPARISON:  CT head 07/11/2017 and earlier. MRI brain 07/07/2017 and earlier. FINDINGS: Brain: Metallic beam hardening streak artifact from the endovascular coil at the basilar tip. Ventricular system normal in size and appearance for age. No mass lesion. No midline shift. No acute hemorrhage or hematoma. No extra-axial fluid collections. No evidence of acute infarction. No focal brain parenchymal abnormalities. Vascular: Coils within the previously treated basilar tip aneurysm. No hyperdense vessel. No visible atherosclerosis. Skull: No skull fracture or other focal osseous abnormality involving the skull. Sinuses/Orbits: Visualized paranasal sinuses, bilateral mastoid air cells and bilateral middle ear cavities well-aerated. Visualized orbits and globes normal in appearance. Other: None. IMPRESSION: 1. No acute intracranial abnormality. 2. Coils within the previously treated basilar tip aneurysm. Electronically Signed   By: Evangeline Dakin M.D.   On: 01/14/2018 07:47    Procedures Procedures (including critical care time)  Medications Ordered in ED Medications - No data to display   Initial Impression / Assessment and Plan / ED Course  I have reviewed the triage vital signs and the nursing notes.  Pertinent labs & imaging results that were available during my care of the patient were reviewed by me and considered in my medical decision making (see chart for details).    Kathy Chaney is a 42 y.o. female who presents to ED for chest discomfort associated with nausea and sweating which began last night while she was playing video games. No exertional component. Pain resolved shortly upon arriving to ED and she has been chest pain free for several hours. She does have hx of brain aneurysm s/p coiling. She notived some changes to her left pupil with her symptoms. Other than small pupil discrepancy, no other neurologic  deficits noted.  Given her history, CT was obtained with no acute findings and a coil from previously treated aneurysm in place.  Normal cardiopulmonary exam.  Labs reviewed and reassuring with negative troponin x2.  Repeat troponin was actually 7 hours after first and again 0.00.  EKG with no acute ischemic changes, although does have some new T wave inversion in inferior lateral leads.  She has had a negative stress test in 2014. Heart score of 3.  Shared decision making conversation about admission versus follow-up with her cardiology clinic.  She has low risk heart score and has had a negative stress test with 2- troponins now, although does have some new T wave inversion on EKG.  Patient has now been chest pain-free for several hours.  She feels comfortable following up with her cardiologist and will call Monday morning.  We spoke at length about reasons to return to the ER, especially any development of exertional chest pain, trouble breathing, new or worsening symptoms or any additional concerns. Patient understands return precautions and follow up plan. All questions answered.  Patient discussed with Dr. Alvino Chapel who agrees with treatment plan.    Final Clinical Impressions(s) / ED Diagnoses   Final diagnoses:  Nonspecific chest pain    ED Discharge Orders    None       Taygen Newsome, Ozella Almond, PA-C 01/14/18 [id number]    Davonna Belling, MD 01/15/18 [phone]

## 2018-01-14 NOTE — Discharge Instructions (Signed)
It was my pleasure taking care of you today!   As we have discussed, todays blood work and imaging are normal. Your EKG had some changes since your last EKG was done, therefore you may require further testing by your cardiologist. Please call your cardiologist on Monday to schedule a follow up appointment.   Return to the Emergency Department if you experience any further chest pain/pressure/tightness, difficulty breathing, sudden sweating, or other symptoms that concern you.

## 2018-01-14 NOTE — ED Notes (Signed)
Patient verbalizes understanding of discharge instructions. Opportunity for questioning and answers were provided. Armband removed by staff, pt discharged from ED. Ambulated out into lobby

## 2018-01-14 NOTE — ED Notes (Signed)
Patient transported to CT 

## 2018-07-16 ENCOUNTER — Encounter (HOSPITAL_COMMUNITY): Payer: Self-pay

## 2018-07-16 ENCOUNTER — Ambulatory Visit (HOSPITAL_COMMUNITY)
Admission: EM | Admit: 2018-07-16 | Discharge: 2018-07-16 | Disposition: A | Discharge status: Home / Self Care | Payer: BLUE CROSS/BLUE SHIELD | Attending: Family Medicine | Admitting: Family Medicine

## 2018-07-16 DIAGNOSIS — R109 Unspecified abdominal pain: Secondary | ICD-10-CM | POA: Insufficient documentation

## 2018-07-16 DIAGNOSIS — R1011 Right upper quadrant pain: Secondary | ICD-10-CM | POA: Insufficient documentation

## 2018-07-16 LAB — CBC
HCT: 38.7 % (ref 36.0–46.0)
Hemoglobin: 13.1 g/dL (ref 12.0–15.0)
MCH: 30.8 pg (ref 26.0–34.0)
MCHC: 33.9 g/dL (ref 30.0–36.0)
MCV: 91.1 fL (ref 80.0–100.0)
Platelets: 267 10*3/uL (ref 150–400)
RBC: 4.25 MIL/uL (ref 3.87–5.11)
RDW: 13 % (ref 11.5–15.5)
WBC: 6.9 10*3/uL (ref 4.0–10.5)
nRBC: 0 % (ref 0.0–0.2)

## 2018-07-16 LAB — POCT URINALYSIS DIP (DEVICE)
Bilirubin Urine: NEGATIVE
Glucose, UA: NEGATIVE mg/dL
Hgb urine dipstick: NEGATIVE
Ketones, ur: NEGATIVE mg/dL
Leukocytes,Ua: NEGATIVE
Nitrite: NEGATIVE
Protein, ur: NEGATIVE mg/dL
Specific Gravity, Urine: 1.01 (ref 1.005–1.030)
Urobilinogen, UA: 0.2 mg/dL (ref 0.0–1.0)
pH: 6.5 (ref 5.0–8.0)

## 2018-07-16 LAB — LIPASE, BLOOD: Lipase: 44 U/L (ref 11–51)

## 2018-07-16 LAB — COMPREHENSIVE METABOLIC PANEL
ALT: 14 U/L (ref 0–44)
AST: 18 U/L (ref 15–41)
Albumin: 3.9 g/dL (ref 3.5–5.0)
Alkaline Phosphatase: 51 U/L (ref 38–126)
Anion gap: 6 (ref 5–15)
BUN: 11 mg/dL (ref 6–20)
CO2: 26 mmol/L (ref 22–32)
Calcium: 9.4 mg/dL (ref 8.9–10.3)
Chloride: 106 mmol/L (ref 98–111)
Creatinine, Ser: 0.86 mg/dL (ref 0.44–1.00)
GFR calc Af Amer: >60 mL/min (ref >60)
GFR calc non Af Amer: >60 mL/min (ref >60)
Glucose, Bld: 95 mg/dL (ref 70–99)
Potassium: 4 mmol/L (ref 3.5–5.1)
Sodium: 138 mmol/L (ref 135–145)
Total Bilirubin: 0.4 mg/dL (ref 0.3–1.2)
Total Protein: 7.1 g/dL (ref 6.5–8.1)

## 2018-07-16 NOTE — ED Provider Notes (Signed)
Kathy Chaney    CSN: [id number] Arrival date & time: 07/16/18  1228     History   Chief Complaint Chief Complaint  Patient presents with  . Urinary Tract Infection    HPI Kathy Chaney is a 43 y.o. female.   Kathy Chaney presents with complaints of right and left sided flank pain which wraps around under ribs bilaterally. Right side pain is worse than left. She states she has some burning with urination but no urgency or frequency. Symptoms started 4 days ago. She feels like she may have had elevated temp last night but none today or since. No pelvic pain. No nausea, vomiting or diarrhea. She feels she may have gotten dehydrated. Feels like it is associated with soda she drank last weekend, which she typically doesn't drink. Pain with laying on the area or touch to the area. Pain 4/10. No increased pain with activity. Hasn't taken any medications for symptoms. Hx of htn, low back pain, hyperthyroidism, basal aneurysm, hernia repair.     ROS per HPI, negative if not otherwise mentioned.      Past Medical History:  Diagnosis Date  . 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 Headache(784.0)   . Hypertension   . Low back pain   . Tobacco abuse     Patient Active Problem List   Diagnosis Date Noted  . Allergic rhinitis, cause unspecified 10/04/2013  . Other abnormal glucose 04/10/2012  . Headache(784.0) 04/10/2012  . Hyperthyroidism 12/30/2011  . TOBACCO USE 02/19/2010  . Essential hypertension, benign 02/19/2010  . 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 HERNIA, UMBILICAL 40/98/1191  . Lumbago 02/19/2010    Past Surgical History:  Procedure Laterality Date  . ANEURYSM COILING  01/2010   basilar artery  . HERNIA REPAIR    . TUBAL LIGATION      OB History   No obstetric history on file.      Home Medications    Prior to Admission medications   Medication Sig Start Date End Date Taking? Authorizing Provider  atenolol (TENORMIN) 50 MG tablet  Take 50 mg by mouth every evening.    [provider]  ciprofloxacin (CILOXAN) 0.3 % ophthalmic solution Administer 2 drops to the left eye every 4 hours while awake for 5 days. Patient not taking: Reported on 01/14/2018 07/11/17   Isla Pence, MD  guaiFENesin-codeine 100-10 MG/5ML syrup Take 5 mLs by mouth 3 (three) times daily as needed for cough. Patient not taking: Reported on 01/14/2018 06/04/16   Barnet Glasgow, NP  hydrochlorothiazide (HYDRODIURIL) 25 MG tablet Take 25 mg by mouth daily.    [provider]  pantoprazole (PROTONIX) 20 MG tablet Take 40 mg by mouth daily. 10/07/17   [provider]  phenazopyridine (PYRIDIUM) 200 MG tablet Take 1 tablet (200 mg total) by mouth 3 (three) times daily as needed for pain. Patient not taking: Reported on 01/14/2018 06/04/16   Barnet Glasgow, NP    Family History Family History  Adopted: Yes  Problem Relation Age of Onset  . Alcohol abuse Neg Hx   . Cancer Neg Hx   . Early death Neg Hx   . Heart disease Neg Hx   . Hyperlipidemia Neg Hx   . Hypertension Neg Hx   . Stroke Neg Hx     Social History Social History   Tobacco Use  . Smoking status: Former Smoker    Packs/day: 0.00    Years: 0.00  Pack years: 0.00    Types: Cigarettes  . Smokeless tobacco: Never Used  Substance Use Topics  . Alcohol use: Yes    Alcohol/week: 0.0 standard drinks  . Drug use: No     Allergies   Other and Amlodipine   Review of Systems Review of Systems   Physical Exam Triage Vital Signs ED Triage Vitals  Enc Vitals Group     BP 07/16/18 1334 (!) 144/91     Pulse Rate 07/16/18 1334 64     Resp 07/16/18 1334 18     Temp 07/16/18 1334 98.2 F (36.8 C)     Temp Source 07/16/18 1334 Oral     SpO2 07/16/18 1334 99 %     Weight --      Height --      Head Circumference --      Peak Flow --      Pain Score 07/16/18 1335 6     Pain Loc --      Pain Edu? --      Excl. in [place]? --    No data found.   Updated Vital Signs BP (!) 144/91 (BP Location: Left Arm)   Pulse 64   Temp 98.2 F (36.8 C) (Oral)   Resp 18   LMP 10/08/2015 (Within Days) Comment: Tubes tied  SpO2 99%    Physical Exam Constitutional:      General: She is not in acute distress.    Appearance: She is well-developed.  Cardiovascular:     Rate and Rhythm: Normal rate.  Pulmonary:     Effort: Pulmonary effort is normal.  Abdominal:     Tenderness: There is abdominal tenderness in the right upper quadrant and epigastric area. There is no right CVA tenderness, left CVA tenderness, guarding or rebound.  Skin:    General: Skin is warm and dry.  Neurological:     Mental Status: She is alert and oriented to person, place, and time.      UC Treatments / Results  Labs (all labs ordered are listed, but only abnormal results are displayed) Labs Reviewed  CBC  COMPREHENSIVE METABOLIC PANEL  LIPASE, BLOOD  POCT URINALYSIS DIP (DEVICE)    EKG None  Radiology No results found.  Procedures Procedures (including critical care time)  Medications Ordered in UC Medications - No data to display  Initial Impression / Assessment and Plan / UC Course  I have reviewed the triage vital signs and the nursing notes.  Pertinent labs & imaging results that were available during my care of the patient were reviewed by me and considered in my medical decision making (see chart for details).    Vitals stable. Afebrile. No findings consistent with acute surgical abdomen. UA normal here today. CBC, CMP and lipase completely normal today. Jerrye Bushy? Gall stones? Encouraged close follow up for recheck. Return precautions provided. Patient verbalized understanding and agreeable to plan.   Final Clinical Impressions(s) / UC Diagnoses   Final diagnoses:  Abdominal pain, right upper quadrant  Right flank pain     Discharge Instructions     Drink plenty of fluids.  Tylenol as needed for pain.  Please take your protonix daily  as prescribed.  Will notify you of any positive findings from your lab testing and if any changes to treatment are needed.   Please follow up with your primary care provider for any persistent symptoms.  If symptoms worsen in any way please go to the ER.  ED Prescriptions    None     Controlled Substance Prescriptions [place] Controlled Substance Registry consulted? Not Applicable   Zigmund Gottron, NP 07/16/18 2159

## 2018-07-16 NOTE — Discharge Instructions (Signed)
Drink plenty of fluids.  Tylenol as needed for pain.  Please take your protonix daily as prescribed.  Will notify you of any positive findings from your lab testing and if any changes to treatment are needed.   Please follow up with your primary care provider for any persistent symptoms.  If symptoms worsen in any way please go to the ER.

## 2018-07-16 NOTE — ED Triage Notes (Signed)
Pt C/o of burning sensations after she  Urinate. Pt states she is having pain in her lower back pain by her kidneys.

## 2018-07-17 ENCOUNTER — Telehealth (HOSPITAL_COMMUNITY): Payer: Self-pay | Admitting: Emergency Medicine

## 2018-07-17 NOTE — Telephone Encounter (Signed)
Patient contacted and made aware of all results, all questions answered.   

## 2018-08-21 ENCOUNTER — Other Ambulatory Visit (HOSPITAL_COMMUNITY): Payer: Self-pay | Admitting: Interventional Radiology

## 2018-08-21 DIAGNOSIS — I671 Cerebral aneurysm, nonruptured: Secondary | ICD-10-CM

## 2018-09-06 ENCOUNTER — Other Ambulatory Visit: Payer: Self-pay

## 2018-09-06 ENCOUNTER — Ambulatory Visit (HOSPITAL_COMMUNITY): Admission: RE | Admit: 2018-09-06 | Payer: BLUE CROSS/BLUE SHIELD | Source: Ambulatory Visit

## 2018-09-06 ENCOUNTER — Ambulatory Visit (HOSPITAL_COMMUNITY)
Admission: RE | Admit: 2018-09-06 | Discharge: 2018-09-06 | Disposition: A | Discharge status: Home / Self Care | Payer: BLUE CROSS/BLUE SHIELD | Source: Ambulatory Visit | Attending: Interventional Radiology | Admitting: Interventional Radiology

## 2018-09-06 DIAGNOSIS — I671 Cerebral aneurysm, nonruptured: Secondary | ICD-10-CM | POA: Diagnosis not present

## 2018-09-11 ENCOUNTER — Telehealth (HOSPITAL_COMMUNITY): Payer: Self-pay

## 2018-09-11 NOTE — Telephone Encounter (Signed)
Pt agreed to f/u in 1 year with MRA head w/o. AW

## 2018-11-10 ENCOUNTER — Other Ambulatory Visit: Payer: Self-pay

## 2018-11-10 DIAGNOSIS — Z20822 Contact with and (suspected) exposure to covid-19: Secondary | ICD-10-CM

## 2018-11-11 LAB — NOVEL CORONAVIRUS, NAA: SARS-CoV-2, NAA: NOT DETECTED

## 2019-06-25 ENCOUNTER — Other Ambulatory Visit: Payer: Self-pay | Admitting: Physician Assistant

## 2019-06-25 DIAGNOSIS — Z1231 Encounter for screening mammogram for malignant neoplasm of breast: Secondary | ICD-10-CM

## 2019-07-02 ENCOUNTER — Ambulatory Visit: Payer: BLUE CROSS/BLUE SHIELD

## 2019-07-06 ENCOUNTER — Ambulatory Visit
Admission: RE | Admit: 2019-07-06 | Discharge: 2019-07-06 | Disposition: A | Discharge status: Home / Self Care | Payer: BLUE CROSS/BLUE SHIELD | Source: Ambulatory Visit | Attending: Physician Assistant | Admitting: Physician Assistant

## 2019-07-06 ENCOUNTER — Other Ambulatory Visit: Payer: Self-pay

## 2019-07-06 DIAGNOSIS — Z1231 Encounter for screening mammogram for malignant neoplasm of breast: Secondary | ICD-10-CM

## 2019-09-05 ENCOUNTER — Telehealth (HOSPITAL_COMMUNITY): Payer: Self-pay

## 2019-09-05 NOTE — Telephone Encounter (Signed)
Called to schedule mra, no answer, left vm. AW  ?

## 2019-09-06 ENCOUNTER — Other Ambulatory Visit (HOSPITAL_COMMUNITY): Payer: Self-pay | Admitting: Interventional Radiology

## 2019-09-06 DIAGNOSIS — I671 Cerebral aneurysm, nonruptured: Secondary | ICD-10-CM

## 2019-10-04 ENCOUNTER — Other Ambulatory Visit: Payer: Self-pay

## 2019-10-04 ENCOUNTER — Ambulatory Visit (HOSPITAL_COMMUNITY)
Admission: RE | Admit: 2019-10-04 | Discharge: 2019-10-04 | Disposition: A | Discharge status: Home / Self Care | Payer: BLUE CROSS/BLUE SHIELD | Source: Ambulatory Visit | Attending: Interventional Radiology | Admitting: Interventional Radiology

## 2019-10-04 DIAGNOSIS — I671 Cerebral aneurysm, nonruptured: Secondary | ICD-10-CM | POA: Diagnosis not present

## 2019-10-08 ENCOUNTER — Other Ambulatory Visit (HOSPITAL_COMMUNITY): Payer: Self-pay | Admitting: Interventional Radiology

## 2019-10-08 DIAGNOSIS — I671 Cerebral aneurysm, nonruptured: Secondary | ICD-10-CM

## 2019-10-11 ENCOUNTER — Ambulatory Visit (HOSPITAL_COMMUNITY)
Admission: RE | Admit: 2019-10-11 | Discharge: 2019-10-11 | Disposition: A | Discharge status: Home / Self Care | Payer: BLUE CROSS/BLUE SHIELD | Source: Ambulatory Visit | Attending: Interventional Radiology | Admitting: Interventional Radiology

## 2019-10-11 ENCOUNTER — Other Ambulatory Visit: Payer: Self-pay

## 2019-10-11 DIAGNOSIS — I671 Cerebral aneurysm, nonruptured: Secondary | ICD-10-CM

## 2019-10-12 NOTE — Consult Note (Signed)
Chief Complaint: Patient was seen in consultation today.Marland Kitchen  Referring Physician(s): [id number]    History of Present Illness: Kathy Chaney is a 44 y.o. 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  The patient presents today with essentially no significant headaches nausea vomiting photophobia visual blurring, or for loss of consciousness or of seizures.  She denies any memory or cognitive difficulties.  Constitutional she denies any recent chills, fever, rigors, myalgias,  She is fully active and leading a normal l life.  Past Medical History:  Diagnosis Date  . 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 Headache(784.0)   . Hypertension   . Low back pain   . Tobacco abuse     Past Surgical History:  Procedure Laterality Date  . ANEURYSM COILING  01/2010   basilar artery  . HERNIA REPAIR    . TUBAL LIGATION      Allergies: Other and Amlodipine  Medications: Prior to Admission medications   Medication Sig Start Date End Date Taking? Authorizing Provider  atenolol (TENORMIN) 50 MG tablet Take 50 mg by mouth every evening.    [provider]  ciprofloxacin (CILOXAN) 0.3 % ophthalmic solution Administer 2 drops to the left eye every 4 hours while awake for 5 days. Patient not taking: Reported on 01/14/2018 07/11/17   Isla Pence, MD  guaiFENesin-codeine 100-10 MG/5ML syrup Take 5 mLs by mouth 3 (three) times daily as needed for cough. Patient not taking: Reported on 01/14/2018 06/04/16   Barnet Glasgow, NP  hydrochlorothiazide (HYDRODIURIL) 25 MG tablet Take 25 mg by mouth daily.    [provider]  pantoprazole (PROTONIX) 20 MG tablet Take 40 mg by mouth daily. 10/07/17   [provider]  phenazopyridine (PYRIDIUM) 200 MG tablet  Take 1 tablet (200 mg total) by mouth 3 (three) times daily as needed for pain. Patient not taking: Reported on 01/14/2018 06/04/16   Barnet Glasgow, NP     Family History  Adopted: Yes  Problem Relation Age of Onset  . Alcohol abuse Neg Hx   . Cancer Neg Hx   . Early death Neg Hx   . Heart disease Neg Hx   . Hyperlipidemia Neg Hx   . Hypertension Neg Hx   . Stroke Neg Hx     Social History   Socioeconomic History  . Marital status: Married    Spouse name: Not on file  . Number of children: Not on file  . Years of education: Not on file  . Highest education level: Not on file  Occupational History  . Not on file  Tobacco Use  . Smoking status: Former Smoker    Packs/day: 0.00    Years: 0.00    Pack years: 0.00    Types: Cigarettes  . Smokeless tobacco: Never Used  Substance and Sexual Activity  . Alcohol use: Yes    Alcohol/week: 0.0 standard drinks  . Drug use: No  . Sexual activity: Not on file  Other Topics Concern  . Not on file  Social History Narrative  . Not on file   Social Determinants of Health   Financial Resource Strain:   . Difficulty of Paying Living Expenses: Not on file  Food Insecurity:   . Worried About Charity fundraiser in the Last Year:  Not on file  . Ran Out of Food in the Last Year: Not on file  Transportation Needs:   . Lack of Transportation (Medical): Not on file  . Lack of Transportation (Non-Medical): Not on file  Physical Activity:   . Days of Exercise per Week: Not on file  . Minutes of Exercise per Session: Not on file  Stress:   . Feeling of Stress : Not on file  Social Connections:   . Frequency of Communication with Friends and Family: Not on file  . Frequency of Social Gatherings with Friends and Family: Not on file  . Attends Religious Services: Not on file  . Active Member of Clubs or Organizations: Not on file  . Attends Archivist Meetings: Not on file  . Marital Status: Not on file     Review of  Systems: A 12 point ROS discussed and pertinent positives are indicated in the HPI above.  All other systems are negative.  Review of Systems.  Negative  Vital Signs: There were no vitals taken for this visit.  Physical Exam.  In no acute distress.  Affect within normal limits.  Neurologically intact grossly.   Imaging: MR ANGIO HEAD WO CONTRAST  Result Date: 10/05/2019 CLINICAL DATA:  Brain aneurysm. EXAM: MRA HEAD WITHOUT CONTRAST TECHNIQUE: Angiographic images of the Circle of Willis were obtained using MRA technique without intravenous contrast. COMPARISON:  MRI brain and MRA head 09/06/2018. FINDINGS: The intracranial internal carotid arteries are patent. The M1 middle cerebral arteries are patent without significant stenosis. No M2 proximal branch occlusion or high-grade proximal stenosis is identified. Unchanged 2 mm infundibulum at the origin of the right posterior communicating artery. The intracranial vertebral arteries are patent. The basilar artery is patent. The posterior cerebral arteries are patent. Redemonstrated susceptibility artifact from coil material within a treated basilar tip aneurysm. Persistent flow related signal at site of the previously treated basilar tip aneurysm may be subtly increased as compared to the prior MRA of 09/06/2018, measuring 2.4 x 2 mm on the current exam (previously 2 x 2 mm) (series 503, image 1). IMPRESSION: Persistent flow-related signal at site of a previously treated basilar tip aneurysm may be subtly increased as compared to the MRA of 09/06/2018, measuring 2.4 x 2 mm on the current exam (previously 2 x 2 mm). 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 Electronically Signed   By: Kellie Simmering DO   On: 10/05/2019 07:54    Labs:  CBC: No results for input(s): WBC, HGB, HCT, PLT in the last 8760 hours.  COAGS: No results for input(s): INR, APTT in the last 8760 hours.  BMP: No results for input(s): NA, K, CL, CO2,  GLUCOSE, BUN, CALCIUM, CREATININE, GFRNONAA, GFRAA in the last 8760 hours.  Invalid input(s): CMP  LIVER FUNCTION TESTS: No results for input(s): BILITOT, AST, ALT, ALKPHOS, PROT, ALBUMIN in the last 8760 hours.  TUMOR MARKERS: No results for input(s): AFPTM, CEA, CA199, CHROMGRNA in the last 8760 hours.  Assessment and Plan: Patient's a recent MRI of the brain was reviewed with her.  The most recent MRI brain suggests a 2.4 mm x 2 mm area of flow signal at the base of the previously called by lidocaine rhythm compared to 2 mm x 17mm approximately 2 years ago.  No new aneurysms identified on MRA.  With the patient being asymptomatic and very mild change in the area described at the base of the previously treated basilar artery aneurysm it was decided to continue  with conservative management and follow-up with a repeat MRA of the brain in a year.   Should she develop unusual headaches, sudden headaches, nausea vomiting photophobia or change in mental status, it was advised she call 911. Plan for follow-up in 1 year with MRA of the brain Informed patient that our schedulers will call the patient to schedule   All questions answered and concerns addressed. Patient conveys understanding and agrees with plan.  Thank you for this interesting consult.  I greatly enjoyed meeting Kathy Chaney and look forward to participating in their care.  A copy of this report was sent to the requesting provider on this date.  Electronically Signed: Rob Hickman, MD 10/12/2019, 12:34 PM   I spent a total 30 minutes of in face to face in clinical consultation, greater than 50% of which was counseling/coordinating care and reviewing patient's medical electronic chart, and previous neuroimaging studies.

## 2020-02-19 DIAGNOSIS — U071 COVID-19: Secondary | ICD-10-CM

## 2020-02-19 HISTORY — DX: COVID-19: U07.1

## 2020-02-26 ENCOUNTER — Other Ambulatory Visit: Payer: Self-pay | Admitting: Internal Medicine

## 2020-02-26 ENCOUNTER — Other Ambulatory Visit: Payer: Self-pay | Admitting: Physician Assistant

## 2020-02-26 ENCOUNTER — Other Ambulatory Visit: Payer: Self-pay | Admitting: Gastroenterology

## 2020-02-26 DIAGNOSIS — K3189 Other diseases of stomach and duodenum: Secondary | ICD-10-CM

## 2020-02-29 ENCOUNTER — Ambulatory Visit
Admission: RE | Admit: 2020-02-29 | Discharge: 2020-02-29 | Disposition: A | Discharge status: Home / Self Care | Payer: BLUE CROSS/BLUE SHIELD | Source: Ambulatory Visit | Attending: Gastroenterology | Admitting: Gastroenterology

## 2020-02-29 DIAGNOSIS — K3189 Other diseases of stomach and duodenum: Secondary | ICD-10-CM

## 2020-06-30 ENCOUNTER — Encounter (HOSPITAL_BASED_OUTPATIENT_CLINIC_OR_DEPARTMENT_OTHER): Payer: Self-pay | Admitting: Obstetrics and Gynecology

## 2020-06-30 ENCOUNTER — Other Ambulatory Visit: Payer: Self-pay

## 2020-06-30 DIAGNOSIS — D219 Benign neoplasm of connective and other soft tissue, unspecified: Secondary | ICD-10-CM

## 2020-06-30 HISTORY — DX: Benign neoplasm of connective and other soft tissue, unspecified: D21.9

## 2020-06-30 NOTE — Progress Notes (Signed)
YOU ARE SCHEDULED FOR A COVID TEST  07-04-2020 @_  950 AM.  THIS TEST MUST BE DONE BEFORE SURGERY. GO TO  [place]. [place], [place], IT IS APPROXIMATELY 2 MINUTES PAST [employer] ON THE RIGHT AND REMAIN IN YOUR CAR, THIS IS A DRIVE UP TEST.       Your procedure is scheduled on 07-08-2020  Report to [facility]   Call this number if you have problems the morning of surgery  :[phone].   OUR ADDRESS IS [address].  WE ARE LOCATED IN [facility].  PLEASE BRING YOUR INSURANCE CARD AND PHOTO ID DAY OF SURGERY.  ONLY ONE PERSON ALLOWED IN FACILITY WAITING AREA.                                     REMEMBER:  DO NOT EAT FOOD, CANDY GUM OR MINTS  AFTER MIDNIGHT . YOU MAY HAVE CLEAR LIQUIDS FROM MIDNIGHT UNTIL  500 AM. NO CLEAR LIQUIDS AFTER 500 AM DAY OF SURGERY.   YOU MAY  BRUSH YOUR TEETH MORNING OF SURGERY AND RINSE YOUR MOUTH OUT, NO CHEWING GUM CANDY OR MINTS.    CLEAR LIQUID DIET   Foods Allowed                                                                     Foods Excluded  Coffee and tea, regular and decaf                             liquids that you cannot  Plain Jell-O any favor except red or purple                                           see through such as: Fruit ices (not with fruit pulp)                                     milk, soups, orange juice  Iced Popsicles                                    All solid food Carbonated beverages, regular and diet                                    Cranberry, grape and apple juices Sports drinks like Gatorade Lightly seasoned clear broth or consume(fat free) Sugar, honey syrup  Sample Menu Breakfast                                Lunch  Supper Cranberry juice                    Beef broth                            Chicken broth Jell-O                                     Grape juice                           Apple  juice Coffee or tea                        Jell-O                                      Popsicle                                                Coffee or tea                        Coffee or tea  _____________________________________________________________________     TAKE THESE MEDICATIONS MORNING OF SURGERY WITH A SIP OF WATER:  PANTAPRAZOLE.  ONE VISITOR IS ALLOWED IN WAITING ROOM ONLY DAY OF SURGERY.  NO VISITOR MAY SPEND THE NIGHT.  VISITOR ARE ALLOWED TO STAY UNTIL 800 PM.                                    DO NOT WEAR JEWERLY, MAKE UP. DO NOT WEAR LOTIONS, POWDERS, PERFUMES OR NAIL POLISH. DO NOT SHAVE FOR 24 HOURS PRIOR TO DAY OF SURGERY. MEN MAY SHAVE FACE AND NECK. CONTACTS, GLASSES, OR DENTURES MAY NOT BE WORN TO SURGERY.                                    Kathy Chaney IS NOT RESPONSIBLE  FOR ANY BELONGINGS.                                                                    Marland Kitchen           Kathy Chaney - Preparing for Surgery Before surgery, you can play an important role.  Because skin is not sterile, your skin needs to be as free of germs as possible.  You can reduce the number of germs on your skin by washing with CHG (chlorahexidine gluconate) soap before surgery.  CHG is an antiseptic cleaner which kills germs and bonds with the skin to continue killing germs even after washing. Please DO NOT use if you have an allergy to CHG or antibacterial soaps.  If your skin becomes reddened/irritated  stop using the CHG and inform your nurse when you arrive at Short Stay. Do not shave (including legs and underarms) for at least 48 hours prior to the first CHG shower.  You may shave your face/neck. Please follow these instructions carefully:  1.  Shower with CHG Soap the night before surgery and the  morning of Surgery.  2.  If you choose to wash your hair, wash your hair first as usual with your  normal  shampoo.  3.  After you shampoo, rinse your hair and body thoroughly to remove the   shampoo.                            4.  Use CHG as you would any other liquid soap.  You can apply chg directly  to the skin and wash                      Gently with a scrungie or clean washcloth.  5.  Apply the CHG Soap to your body ONLY FROM THE NECK DOWN.   Do not use on face/ open                           Wound or open sores. Avoid contact with eyes, ears mouth and genitals (private parts).                       Wash face,  Genitals (private parts) with your normal soap.             6.  Wash thoroughly, paying special attention to the area where your surgery  will be performed.  7.  Thoroughly rinse your body with warm water from the neck down.  8.  DO NOT shower/wash with your normal soap after using and rinsing off  the CHG Soap.                9.  Pat yourself dry with a clean towel.            10.  Wear clean pajamas.            11.  Place clean sheets on your bed the night of your first shower and do not  sleep with pets. Day of Surgery : Do not apply any lotions/deodorants the morning of surgery.  Please wear clean clothes to the hospital/surgery center.  FAILURE TO FOLLOW THESE INSTRUCTIONS MAY RESULT IN THE CANCELLATION OF YOUR SURGERY PATIENT SIGNATURE_________________________________  NURSE SIGNATURE__________________________________  ________________________________________________________________________                                                        QUESTIONS Kathy Chaney PRE OP NURSE PHONE [phone].

## 2020-06-30 NOTE — Progress Notes (Signed)
Spoke w/ via phone for pre-op interview---pt Lab needs dos----  cbc bmp t & s , ekg urine poct per anesthesia, surgery orders pending COVID test -----07-04-2020 950 am (overnight stay) Diabetic medication -----n/a Arrive 600 am 07-08-2020 No solid foot after midnight, clear liquids from midnight until 500 am then npo Patient instructed no nail polish to be worn day of surgery Patient instructed to bring photo id and insurance card day of surgery Patient aware to have Driver (ride ) / caregiver    spouse Kathy Chaney will stay for 24 hours after surgery  Patient Special Instructions -----pt given overnight stay instructions Pre-Op special Istructions -----surgery orders req dr Landry Mellow epic ib Patient verbalized understanding of instructions that were given at this phone interview. Patient denies shortness of breath, chest pain, fever, cough at this phone interview.   Sees dr Patrecia Pour yearly ( [facility] 2019) mr angio head w/o contrast 09-06-2018 epic

## 2020-07-04 ENCOUNTER — Other Ambulatory Visit: Payer: Self-pay

## 2020-07-04 ENCOUNTER — Other Ambulatory Visit (HOSPITAL_COMMUNITY)
Admission: RE | Admit: 2020-07-04 | Discharge: 2020-07-04 | Disposition: A | Discharge status: Home / Self Care | Payer: BLUE CROSS/BLUE SHIELD | Source: Ambulatory Visit | Attending: Obstetrics and Gynecology | Admitting: Obstetrics and Gynecology

## 2020-07-04 ENCOUNTER — Encounter (HOSPITAL_COMMUNITY)
Admission: RE | Admit: 2020-07-04 | Discharge: 2020-07-04 | Disposition: A | Discharge status: Home / Self Care | Payer: BLUE CROSS/BLUE SHIELD | Source: Ambulatory Visit | Attending: Obstetrics and Gynecology | Admitting: Obstetrics and Gynecology

## 2020-07-04 DIAGNOSIS — Z20822 Contact with and (suspected) exposure to covid-19: Secondary | ICD-10-CM | POA: Diagnosis not present

## 2020-07-04 DIAGNOSIS — Z01818 Encounter for other preprocedural examination: Secondary | ICD-10-CM | POA: Diagnosis not present

## 2020-07-04 LAB — CBC
HCT: 40.2 % (ref 36.0–46.0)
Hemoglobin: 13.5 g/dL (ref 12.0–15.0)
MCH: 30.4 pg (ref 26.0–34.0)
MCHC: 33.6 g/dL (ref 30.0–36.0)
MCV: 90.5 fL (ref 80.0–100.0)
Platelets: 291 10*3/uL (ref 150–400)
RBC: 4.44 MIL/uL (ref 3.87–5.11)
RDW: 13.6 % (ref 11.5–15.5)
WBC: 5.8 10*3/uL (ref 4.0–10.5)
nRBC: 0 % (ref 0.0–0.2)

## 2020-07-04 LAB — BASIC METABOLIC PANEL
Anion gap: 8 (ref 5–15)
BUN: 10 mg/dL (ref 6–20)
CO2: 25 mmol/L (ref 22–32)
Calcium: 8.9 mg/dL (ref 8.9–10.3)
Chloride: 105 mmol/L (ref 98–111)
Creatinine, Ser: 0.83 mg/dL (ref 0.44–1.00)
GFR, Estimated: >60 mL/min (ref >60)
Glucose, Bld: 100 mg/dL — ABNORMAL HIGH (ref 70–99)
Potassium: 3.8 mmol/L (ref 3.5–5.1)
Sodium: 138 mmol/L (ref 135–145)

## 2020-07-04 LAB — SARS CORONAVIRUS 2 (TAT 6-24 HRS): SARS Coronavirus 2: NEGATIVE

## 2020-07-07 ENCOUNTER — Other Ambulatory Visit: Payer: Self-pay | Admitting: Obstetrics and Gynecology

## 2020-07-07 NOTE — H&P (Deleted)
  The note originally documented on this encounter has been moved the the encounter in which it belongs.  

## 2020-07-07 NOTE — H&P (Signed)
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 Does patient report new onset of COVID symptoms? No , No. Has patient or close contact tested positive for COVID-19? No , not in the past 2 weeks , No , not in the past 2 weeks.  General 45 yo presents for pre-op visit for robotic laparoscopic assisted hysterectomy with bilateral salpingectomy. for the management of dysmenorrhea and fibroids. c/o pelvic and back pain and cramping during her periods. H/o small fibroids many years ago. Endorsed stress incontinence w/ coughing, sneezing, and exercise. Pelvic exam revealed 12 wk size uterus and tenderness to palpation of uterus. Ibuprofen 800 MG prescribed. Pap neg/HPV neg. Her ultrasound 01/21/2020 reveals a uterus 14.2 cm x 12.3 cm x 9.1 cm .. endometrium is 1.08 cm .. 7 fibroids were measured the largest is 6.1 cm. Her ovaries appeared normal bilaterally. 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 Pt has had 3 SVD. Smoking Cessation 2015. Pt works at [employer] and is married. Current Medication: Taking  Atenolol 100 MG Tablet 1 tablet Orally Once a day.     Pantoprazole Sodium 20 MG Tablet Delayed Release 1 tablet Orally Once a day.     Singulair(Montelukast Sodium) 10 MG Tablet 1 tablet Orally Once a day, Notes: prn.     Ibuprofen 800 MG Tablet 1 tablet with food or milk as needed Orally every 8 hrs as needed, Notes: prn.     ZyrTEC(Cetirizine HCl) 10 MG Tablet 1 tablet Orally Once a day, Notes: prn.     Medication List reviewed and reconciled with the patient.  Medical History:   Cerebral Aneurysm dx in 2012 with coil ( Dr. Estanislado Pandy)     HTN     GERD      Allergies/Intolerance: Amlodipine Besylate: Side Effects - cough Toprol XL: Side Effects - joint pain  Losartan Potassium: Side Effects - palpitations Gyn History:  Sexual activity currently sexually active. Periods : regular. LMP 01/02/2020. Birth control BTL. Last pap smear date 01/08/2020 - HPV neg. Last mammogram date 07/06/2019 - normal. H/O Abnormal pap smear [facility].   OB History:  Number of pregnancies 6. Pregnancy # 1 live birth, vaginal delivery, girl. Pregnancy # 2 abortion. Pregnancy # 3 live birth, vaginal delivery, boy. Pregnancy # 4: abortion. Pregnancy # 5: Abortion. Pregnancy # 6 live birth, vaginal, girl.   Surgical History:  BTL 08/2008     Endovascular Coiling 83/1517     umbilical hernia repair 61/6073   Hospitalization:  [facility] 2012     Childbirth 2010/2002/1997     not in the past year 12/2019   Family History:  Father: deceased, lung cancer, diagnosed with Lung Cancer    Mother: deceased, alcoholism    Brother 1: alive    Sister 5: alive    Sister 2: alive   Pt Adopted- Family History is basically unknown but she has found 9 siblings; father passed of lung cancer, mother passed from [place].  Social History: General Tobacco use cigarettes: Former smoker, Quit in year 2015, Tobacco history last updated 06/24/2020, Vaping No.  no EXPOSURE TO PASSIVE SMOKE.  Alcohol: yes, 4 drinks a week.  Caffeine: yes, coffee, 1 a day.  no Recreational drug use.  DIET: IF.  Exercise: active job, some additional.  DENTAL CARE: good.  Marital Status: married, Braedyn Riggle.  Children: 3.  OCCUPATION: [employer].  COMMUNICATION BARRIERS: none.  ROS: CONSTITUTIONAL No" label="Chills" value="" options="no,yes" propid="91" itemid="193425" categoryid="10464" encounterid="[id number]"Chills No. No" label="Fatigue" value="" options="no,yes" propid="91" itemid="[id number]" categoryid="10464"  encounterid="[id number]"Fatigue No. No" label="Fever" value="" options="no,yes" propid="91" itemid="10467" categoryid="10464" encounterid="[id number]"Fever No. No" label="Night sweats" value="" options="no,yes" propid="91" itemid="193426" categoryid="10464" encounterid="[id number]"Night sweats No. No" label="Recent travel outside [place]" value="" options="no,yes" propid="91" itemid="444261" categoryid="10464" encounterid="[id number]"Recent travel outside [place] No. No" label="Sweats" value="" options="no,yes" propid="91" itemid="193427" categoryid="10464" encounterid="[id number]"Sweats No. No" label="Weight change" value="" options="no,yes" propid="91" itemid="194825" categoryid="10464" encounterid="[id number]"Weight change No.  OPHTHALMOLOGY no" label="Blurring of vision" value="" options="no,yes" propid="91" itemid="12520" categoryid="12516" encounterid="[id number]"Blurring of vision no. no" label="Change in vision" value="" options="no,yes" propid="91" itemid="193469" categoryid="12516" encounterid="[id number]"Change in vision no. no" label="Double vision" value="" options="no,yes" propid="91" itemid="194379" categoryid="12516" encounterid="[id number]"Double vision no.  ENT no" label="Dizziness" value="" options="no,yes" propid="91" itemid="193612" categoryid="10481" encounterid="[id number]"Dizziness no. Nose bleeds no. Sore throat no. Teeth pain no.  ALLERGY no" label="Hives" value="" options="no,yes" propid="91" itemid="202589" categoryid="138152" encounterid="[id number]"Hives no.  CARDIOLOGY no" label="Chest pain" value="" options="no,yes" propid="91" itemid="193603" categoryid="10488" encounterid="[id number]"Chest pain no. no" label="High blood pressure" value="" options="no,yes" propid="91" itemid="199089" categoryid="10488" encounterid="[id number]"High blood pressure no. no" label="Irregular heart beat" value="" options="no,yes" propid="91" itemid="202598" categoryid="10488" encounterid="[id number]"Irregular heart beat no. no"  label="Leg edema" value="" options="no,yes" propid="91" itemid="10491" categoryid="10488" encounterid="[id number]"Leg edema no. no" label="Palpitations" value="" options="no,yes" propid="91" itemid="10490" categoryid="10488" encounterid="[id number]"Palpitations no.  RESPIRATORY Shortness of breath no. no" label="Cough" value="" options="no,yes" propid="91" itemid="172745" categoryid="138132" encounterid="[id number]"Cough no. no" label="Wheezing" value="" options="no,yes" propid="91" itemid="193621" categoryid="138132" encounterid="[id number]"Wheezing no.  UROLOGY no" label="Pain with urination" value="" options="no,yes" propid="91" itemid="194377" categoryid="138166" encounterid="[id number]"Pain with urination no. no" label="Urinary urgency" value="" options="no,yes" propid="91" itemid="193493" categoryid="138166" encounterid="[id number]"Urinary urgency no. no" label="Urinary frequency" value="" options="no,yes" propid="91" itemid="193492" categoryid="138166" encounterid="[id number]"Urinary frequency no. no" label="Urinary incontinence" value="" options="no,yes" propid="91" itemid="138171" categoryid="138166" encounterid="[id number]"Urinary incontinence no. No" label="Difficulty urinating" value="" options="no,yes" propid="91" itemid="138167" categoryid="138166" encounterid="[id number]"Difficulty urinating No. No" label="Blood in urine" value="" options="no,yes" propid="91" itemid="138168" categoryid="138166" encounterid="[id number]"Blood in urine No.  GASTROENTEROLOGY no" label="Abdominal pain" value="" options="no,yes" propid="91" itemid="10496" categoryid="10494" encounterid="[id number]"Abdominal pain no. no" label="Appetite change" value="" options="no,yes" propid="91" itemid="193447" categoryid="10494" encounterid="[id number]"Appetite change no. no" label="Bloating/belching" value="" options="no,yes" propid="91" itemid="193448" categoryid="10494" encounterid="[id number]"Bloating/belching no. no" label="Blood in stool or on toilet  paper" value="" options="no,yes" propid="91" itemid="10503" categoryid="10494" encounterid="[id number]"Blood in stool or on toilet paper no. no" label="Change in bowel movements" value="" options="no,yes" propid="91" itemid="199106" categoryid="10494" encounterid="[id number]"Change in bowel movements no. no" label="Constipation" value="" options="no,yes" propid="91" itemid="10501" categoryid="10494" encounterid="[id number]"Constipation no. no" label="Diarrhea" value="" options="no,yes" propid="91" itemid="10502" categoryid="10494" encounterid="[id number]"Diarrhea no. no" label="Difficulty swallowing" value="" options="no,yes" propid="91" itemid="199104" categoryid="10494" encounterid="[id number]"Difficulty swallowing no. no" label="Nausea" value="" options="no,yes" propid="91" itemid="10499" categoryid="10494" encounterid="[id number]"Nausea no.  FEMALE REPRODUCTIVE no" label="Vulvar pain" value="" options="no,yes" propid="91" itemid="453725" categoryid="10525" encounterid="[id number]"Vulvar pain no. no" label="Vulvar rash" value="" options="no,yes" propid="91" itemid="453726" categoryid="10525" encounterid="[id number]"Vulvar rash no. no" label="Abnormal vaginal bleeding" value="" options="no, yes" propid="91" itemid="444315" categoryid="10525" encounterid="[id number]"Abnormal vaginal bleeding no. no" label="Breast pain" value="" options="no,yes" propid="91" itemid="186083" categoryid="10525" encounterid="[id number]"Breast pain no. no" label="Nipple discharge" value="" options="no,yes" propid="91" itemid="186084" categoryid="10525" encounterid="[id number]"Nipple discharge no. no" label="Pain with intercourse" value="" options="no,yes" propid="91" itemid="275823" categoryid="10525" encounterid="[id number]"Pain with intercourse no. no" label="Pelvic pain" value="" options="no,yes" propid="91" itemid="186082" categoryid="10525" encounterid="[id number]"Pelvic pain no. no" label="Unusual vaginal discharge" value="" options="no,yes" propid="91"  itemid="278230" categoryid="10525" encounterid="[id number]"Unusual vaginal discharge no. no" label="Vaginal itching" value="" options="no,yes" propid="91" itemid="278942" categoryid="10525" encounterid="[id number]"Vaginal itching no.  MUSCULOSKELETAL no" label="Muscle aches" value="" options="no,yes" propid="91" itemid="193461" categoryid="10514" encounterid="[id number]"Muscle aches no.  NEUROLOGY no" label="Headache" value="" options="no,yes" propid="91" itemid="12513" categoryid="12512" encounterid="[id number]"Headache no. no" label="Tingling/numbness" value="" options="no,yes" propid="91" itemid="12514" categoryid="12512" encounterid="[id number]"Tingling/numbness no. no" label="Weakness" value="" options="no,yes" propid="91" itemid="193468" categoryid="12512" encounterid="[id number]"Weakness no.  PSYCHOLOGY no" label="Depression" value="" options="" propid="91" itemid="275919" categoryid="10520" encounterid="[id number]"Depression no.  no" label="Anxiety" value="" options="no,yes" propid="91" itemid="172748" categoryid="10520" encounterid="[id number]"Anxiety no. no" label="Nervousness" value="" options="no,yes" propid="91" itemid="199158" categoryid="10520" encounterid="[id number]"Nervousness no. no" label="Sleep disturbances" value="" options="no,yes" propid="91" itemid="12502" categoryid="10520" encounterid="[id number]"Sleep disturbances no. no " label="Suicidal ideation" value="" options="no,yes" propid="91" itemid="72718" categoryid="10520" encounterid="[id number]"Suicidal ideation no .  ENDOCRINOLOGY no" label="Excessive thirst" value="" options="no,yes" propid="91" itemid="194628" categoryid="12508" encounterid="[id number]"Excessive thirst no. no" label="Excessive urination" value="" options="no,yes" propid="91" itemid="196285" categoryid="12508" encounterid="[id number]"Excessive urination no. no" label="Hair loss" value="" options="no, yes" propid="91" itemid="444314" categoryid="12508" encounterid="[id number]"Hair loss no. no"  label="Heat or cold intolerance" value="" options="" propid="91" itemid="447284" categoryid="12508" encounterid="[id number]"Heat or cold intolerance no.  HEMATOLOGY/LYMPH no" label="Abnormal bleeding" value="" options="no,yes" propid="91" itemid="199152" categoryid="138157" encounterid="13716376"Abnormal bleeding no. no" label="Easy bruising" value="" options="no,yes" propid="91" itemid="170653" categoryid="138157" encounterid="13716376"Easy bruising no. no" label="Swollen glands" value="" options="no,yes" propid="91" itemid="138158" categoryid="138157" encounterid="[id number]"Swollen glands no.  DERMATOLOGY no" label="New/changing skin lesion" value="" options="no,yes" propid="91" itemid="199126" categoryid="12503" encounterid="[id number]"New/changing skin lesion no. no" label="Rash" value="" options="no,yes" propid="91" itemid="12504" categoryid="12503" encounterid="13716376"Rash no. no" label="Sores" value="" options="" propid="91" itemid="444313" categoryid="12503" encounterid="[id number]"Sores no.  Negative except as stated in HPI.  Objective: Vitals: Wt 166.2, Wt change 3.6 lb, Ht 62.5, BMI 29.91, Pulse sitting 59, BP sitting 132/88.  Past Results: Examination:  General Examination alert, oriented, NAD " label="CONSTITUTIONAL:" categoryPropId="10089" examid="193638"CONSTITUTIONAL: alert, oriented, NAD .  moist, warm" label="SKIN:" categoryPropId="10109" examid="193638"SKIN: moist, warm.  Conjunctiva clear" label="EYES:" categoryPropId="21468" examid="193638"EYES: Conjunctiva clear.  good I:E efffort noted" label="LUNGS:" categoryPropId="87" examid="193638"LUNGS: good I:E efffort noted.  regular rate and rhythm" label="HEART:" categoryPropId="86" examid="193638"HEART: regular rate and rhythm.  soft, non-tender/non-distended, bowel sounds present " label="ABDOMEN:" categoryPropId="88" examid="193638"ABDOMEN: soft, non-tender/non-distended, bowel sounds present .  FEMALE GENITOURINARY: normal external  genitalia, labia - unremarkable, vagina - pink moist mucosa, no lesions or abnormal discharge, cervix - no discharge or lesions or CMT, adnexa - no masses or tenderness, 18 week size uterus - tender to palpation.  affect normal, good eye contact" label="PSYCH:" categoryPropId="16316" examid="193638"PSYCH: affect normal, good eye contact.  Physical Examination: Chaperone present Maxwell,Leigh 06/24/2020 10:08:21 AM &gt; , for pelvic exam" label="Chaperone present" itemId="[id number]" categoryId="[id number]"Chaperone present [facility] 06/24/2020 10:08:21 AM > , for pelvic exam.  Pt aware of scribe services today.   Assessment: Assessment:  Dysmenorrhea - N94.6 (Primary)     Fibroids - D21.9     Plan: Treatment: Dysmenorrhea Notes: Pt is scheduled for robotic laparoscopic assisted hysterectomy with bilateral salpingectomy on 07/08/2020 for management of/secondary to Dysmenorrhea, Uterine Enlargement, and Fibroids.Pt advised she will stay overnight, or 2 days if conversion to larger incision. She is advised that in order to be discharged from hospital, she will need to be able to ambulate, urinate, flatulate, tolerate food by mouth, and take pain medication by mouth. Discussed risks of hysterectomy including but not limited to infection, bleeding, conversion to larger incision, damage to her bowel, bladder, or ureters, with the need for further surgery. Discussed risk of blood transfusion and risk of HIV or hep B&C with blood transfusion. Pt is aware of risks and desires blood transfusion if needed. Pt advised to avoid NSAIDs (Aspirin, Aleve, Advil, Ibuprofen, Motrin) from now until surgery given risk of bleeding during surgery. She may take Tylenol for pain management. She is advised to avoid eating or drinking starting midnight prior to surgery. Discussed post-surgery avoidance of driving for 1 week and avoidance of lifting weight greater than 10 lbs or intercourse for 6-8 weeks after  procedure.. Fibroids Notes: Pt is scheduled for robotic laparoscopic assisted hysterectomy with bilateral salpingectomy on 07/08/2020 for management of/secondary to Dysmenorrhea, Uterine Enlargement, and Fibroids.Pt advised she will stay overnight, or 2 days if conversion to larger incision. She  is advised that in order to be discharged from hospital, she will need to be able to ambulate, urinate, flatulate, tolerate food by mouth, and take pain medication by mouth. Discussed risks of hysterectomy including but not limited to infection, bleeding, conversion to larger incision, damage to her bowel, bladder, or ureters, with the need for further surgery. Discussed risk of blood transfusion and risk of HIV or hep B&C with blood transfusion. Pt is aware of risks and desires blood transfusion if needed. Pt advised to avoid NSAIDs (Aspirin, Aleve, Advil, Ibuprofen, Motrin) from now until surgery given risk of bleeding during surgery. She may take Tylenol for pain management. She is advised to avoid eating or drinking starting midnight prior to surgery. Discussed post-surgery avoidance of driving for 1 week and avoidance of lifting weight greater than 10 lbs or intercourse for 6-8 weeks after procedure. Marland Kitchen

## 2020-07-08 ENCOUNTER — Observation Stay (HOSPITAL_BASED_OUTPATIENT_CLINIC_OR_DEPARTMENT_OTHER): Payer: BLUE CROSS/BLUE SHIELD | Admitting: Anesthesiology

## 2020-07-08 ENCOUNTER — Encounter (HOSPITAL_BASED_OUTPATIENT_CLINIC_OR_DEPARTMENT_OTHER): Payer: Self-pay | Admitting: Obstetrics and Gynecology

## 2020-07-08 ENCOUNTER — Observation Stay (HOSPITAL_BASED_OUTPATIENT_CLINIC_OR_DEPARTMENT_OTHER)
Admission: RE | Admit: 2020-07-08 | Discharge: 2020-07-09 | Disposition: A | Discharge status: Home / Self Care | Payer: BLUE CROSS/BLUE SHIELD | Attending: Obstetrics and Gynecology | Admitting: Obstetrics and Gynecology

## 2020-07-08 ENCOUNTER — Encounter (HOSPITAL_BASED_OUTPATIENT_CLINIC_OR_DEPARTMENT_OTHER)
Admission: RE | Disposition: A | Discharge status: Home / Self Care | Payer: Self-pay | Source: Home / Self Care | Attending: Obstetrics and Gynecology

## 2020-07-08 ENCOUNTER — Other Ambulatory Visit: Payer: Self-pay

## 2020-07-08 DIAGNOSIS — I10 Essential (primary) hypertension: Secondary | ICD-10-CM | POA: Insufficient documentation

## 2020-07-08 DIAGNOSIS — D259 Leiomyoma of uterus, unspecified: Principal | ICD-10-CM | POA: Insufficient documentation

## 2020-07-08 DIAGNOSIS — Z90710 Acquired absence of both cervix and uterus: Secondary | ICD-10-CM | POA: Diagnosis present

## 2020-07-08 DIAGNOSIS — N72 Inflammatory disease of cervix uteri: Secondary | ICD-10-CM | POA: Insufficient documentation

## 2020-07-08 DIAGNOSIS — D219 Benign neoplasm of connective and other soft tissue, unspecified: Secondary | ICD-10-CM | POA: Diagnosis present

## 2020-07-08 DIAGNOSIS — Z87891 Personal history of nicotine dependence: Secondary | ICD-10-CM | POA: Diagnosis not present

## 2020-07-08 DIAGNOSIS — N946 Dysmenorrhea, unspecified: Secondary | ICD-10-CM | POA: Diagnosis present

## 2020-07-08 DIAGNOSIS — N8500 Endometrial hyperplasia, unspecified: Secondary | ICD-10-CM | POA: Diagnosis not present

## 2020-07-08 DIAGNOSIS — Z79899 Other long term (current) drug therapy: Secondary | ICD-10-CM | POA: Diagnosis not present

## 2020-07-08 HISTORY — DX: Other localized visual field defect, unspecified eye: H53.459

## 2020-07-08 HISTORY — PX: ROBOTIC ASSISTED LAPAROSCOPIC HYSTERECTOMY AND SALPINGECTOMY: SHX6379

## 2020-07-08 HISTORY — DX: Gastro-esophageal reflux disease without esophagitis: K21.9

## 2020-07-08 LAB — TYPE AND SCREEN
ABO/RH(D): AB POS
Antibody Screen: NEGATIVE

## 2020-07-08 LAB — ABO/RH: ABO/RH(D): AB POS

## 2020-07-08 LAB — POCT PREGNANCY, URINE: Preg Test, Ur: NEGATIVE

## 2020-07-08 SURGERY — XI ROBOTIC ASSISTED LAPAROSCOPIC HYSTERECTOMY AND SALPINGECTOMY
Anesthesia: General | Site: Abdomen | Laterality: Bilateral

## 2020-07-08 MED ORDER — ACETAMINOPHEN 500 MG PO TABS
ORAL_TABLET | ORAL | Status: AC
  Filled 2020-07-08: qty 2

## 2020-07-08 MED ORDER — LIDOCAINE HCL (CARDIAC) PF 100 MG/5ML IV SOSY
PREFILLED_SYRINGE | INTRAVENOUS | Status: DC | PRN
  Administered 2020-07-08: 80 mg via INTRATRACHEAL

## 2020-07-08 MED ORDER — LIDOCAINE HCL (PF) 2 % IJ SOLN
INTRAMUSCULAR | Status: AC
  Filled 2020-07-08: qty 5

## 2020-07-08 MED ORDER — OXYCODONE HCL 5 MG PO TABS
5.0000 mg | ORAL_TABLET | ORAL | Status: DC | PRN
  Administered 2020-07-08 (×3): 10 mg via ORAL
  Administered 2020-07-09 (×2): 5 mg via ORAL

## 2020-07-08 MED ORDER — EPHEDRINE SULFATE-NACL 50-0.9 MG/10ML-% IV SOSY
PREFILLED_SYRINGE | INTRAVENOUS | Status: DC | PRN
  Administered 2020-07-08: 15 mg via INTRAVENOUS

## 2020-07-08 MED ORDER — SENNA 8.6 MG PO TABS
1.0000 | ORAL_TABLET | Freq: Two times a day (BID) | ORAL | Status: DC
  Administered 2020-07-08 (×2): 8.6 mg via ORAL

## 2020-07-08 MED ORDER — KETAMINE HCL 50 MG/5ML IJ SOSY
PREFILLED_SYRINGE | INTRAMUSCULAR | Status: AC
  Filled 2020-07-08: qty 5

## 2020-07-08 MED ORDER — SENNA 8.6 MG PO TABS
ORAL_TABLET | ORAL | Status: AC
  Filled 2020-07-08: qty 1

## 2020-07-08 MED ORDER — CELECOXIB 200 MG PO CAPS
400.0000 mg | ORAL_CAPSULE | ORAL | Status: AC
  Administered 2020-07-08: 400 mg via ORAL

## 2020-07-08 MED ORDER — DEXAMETHASONE SODIUM PHOSPHATE 10 MG/ML IJ SOLN
INTRAMUSCULAR | Status: DC | PRN
  Administered 2020-07-08: 10 mg via INTRAVENOUS

## 2020-07-08 MED ORDER — PROPOFOL 10 MG/ML IV BOLUS
INTRAVENOUS | Status: AC
  Filled 2020-07-08: qty 40

## 2020-07-08 MED ORDER — ONDANSETRON HCL 4 MG PO TABS: 4.0000 mg | ORAL_TABLET | Freq: Four times a day (QID) | ORAL | Status: DC | PRN

## 2020-07-08 MED ORDER — ARTIFICIAL TEARS OPHTHALMIC OINT
TOPICAL_OINTMENT | OPHTHALMIC | Status: AC
  Filled 2020-07-08: qty 3.5

## 2020-07-08 MED ORDER — LACTATED RINGERS IV SOLN: INTRAVENOUS | Status: DC

## 2020-07-08 MED ORDER — IBUPROFEN 200 MG PO TABS
600.0000 mg | ORAL_TABLET | Freq: Four times a day (QID) | ORAL | Status: DC
  Administered 2020-07-08 – 2020-07-09 (×2): 600 mg via ORAL

## 2020-07-08 MED ORDER — KETOROLAC TROMETHAMINE 30 MG/ML IJ SOLN
30.0000 mg | Freq: Once | INTRAMUSCULAR | Status: AC
  Administered 2020-07-08: 30 mg via INTRAVENOUS

## 2020-07-08 MED ORDER — IBUPROFEN 600 MG PO TABS: 600.0000 mg | ORAL_TABLET | Freq: Four times a day (QID) | ORAL | 1 refills | Status: DC | PRN

## 2020-07-08 MED ORDER — FENTANYL CITRATE (PF) 250 MCG/5ML IJ SOLN
INTRAMUSCULAR | Status: DC | PRN
  Administered 2020-07-08: 100 ug via INTRAVENOUS
  Administered 2020-07-08: 50 ug via INTRAVENOUS

## 2020-07-08 MED ORDER — KETAMINE HCL 10 MG/ML IJ SOLN
INTRAMUSCULAR | Status: DC | PRN
  Administered 2020-07-08: 30 mg via INTRAVENOUS

## 2020-07-08 MED ORDER — GLYCOPYRROLATE PF 0.2 MG/ML IJ SOSY
PREFILLED_SYRINGE | INTRAMUSCULAR | Status: AC
  Filled 2020-07-08: qty 1

## 2020-07-08 MED ORDER — KETOROLAC TROMETHAMINE 30 MG/ML IJ SOLN
INTRAMUSCULAR | Status: AC
  Filled 2020-07-08: qty 1

## 2020-07-08 MED ORDER — OXYCODONE HCL 5 MG PO TABS
ORAL_TABLET | ORAL | Status: AC
  Filled 2020-07-08: qty 2

## 2020-07-08 MED ORDER — ONDANSETRON HCL 4 MG/2ML IJ SOLN
INTRAMUSCULAR | Status: AC
  Filled 2020-07-08: qty 2

## 2020-07-08 MED ORDER — ATENOLOL 50 MG PO TABS
50.0000 mg | ORAL_TABLET | Freq: Every evening | ORAL | Status: DC
  Administered 2020-07-08: 50 mg via ORAL
  Filled 2020-07-08: qty 1

## 2020-07-08 MED ORDER — LIDOCAINE 2% (20 MG/ML) 5 ML SYRINGE
INTRAMUSCULAR | Status: DC | PRN
  Administered 2020-07-08: 1.5 mg/kg/h via INTRAVENOUS

## 2020-07-08 MED ORDER — SUGAMMADEX SODIUM 200 MG/2ML IV SOLN
INTRAVENOUS | Status: DC | PRN
  Administered 2020-07-08: 150 mg via INTRAVENOUS

## 2020-07-08 MED ORDER — CELECOXIB 200 MG PO CAPS
ORAL_CAPSULE | ORAL | Status: AC
  Filled 2020-07-08: qty 2

## 2020-07-08 MED ORDER — HYDROMORPHONE HCL 1 MG/ML IJ SOLN
INTRAMUSCULAR | Status: AC
  Filled 2020-07-08: qty 1

## 2020-07-08 MED ORDER — HYDROMORPHONE HCL 1 MG/ML IJ SOLN
0.2000 mg | INTRAMUSCULAR | Status: DC | PRN
Start: 2020-07-08 — End: 2020-07-09
  Administered 2020-07-08: 0.5 mg via INTRAVENOUS

## 2020-07-08 MED ORDER — ACETAMINOPHEN 500 MG PO TABS
1000.0000 mg | ORAL_TABLET | Freq: Four times a day (QID) | ORAL | Status: DC
  Administered 2020-07-08 – 2020-07-09 (×4): 1000 mg via ORAL

## 2020-07-08 MED ORDER — GLYCOPYRROLATE PF 0.2 MG/ML IJ SOSY
PREFILLED_SYRINGE | INTRAMUSCULAR | Status: DC | PRN
  Administered 2020-07-08: .2 mg via INTRAVENOUS

## 2020-07-08 MED ORDER — EPHEDRINE 5 MG/ML INJ
INTRAVENOUS | Status: AC
  Filled 2020-07-08: qty 10

## 2020-07-08 MED ORDER — FENTANYL CITRATE (PF) 250 MCG/5ML IJ SOLN
INTRAMUSCULAR | Status: AC
  Filled 2020-07-08: qty 5

## 2020-07-08 MED ORDER — DIPHENHYDRAMINE HCL 50 MG/ML IJ SOLN
INTRAMUSCULAR | Status: DC | PRN
  Administered 2020-07-08: 12.5 mg via INTRAVENOUS

## 2020-07-08 MED ORDER — ACETAMINOPHEN 500 MG PO TABS: 1000.0000 mg | ORAL_TABLET | Freq: Three times a day (TID) | ORAL | 0 refills | Status: AC | PRN

## 2020-07-08 MED ORDER — PROPOFOL 10 MG/ML IV BOLUS
INTRAVENOUS | Status: DC | PRN
  Administered 2020-07-08: 20 mg via INTRAVENOUS
  Administered 2020-07-08: 150 mg via INTRAVENOUS

## 2020-07-08 MED ORDER — ROCURONIUM BROMIDE 10 MG/ML (PF) SYRINGE
PREFILLED_SYRINGE | INTRAVENOUS | Status: DC | PRN
  Administered 2020-07-08: 15 mg via INTRAVENOUS
  Administered 2020-07-08: 65 mg via INTRAVENOUS
  Administered 2020-07-08: 10 mg via INTRAVENOUS

## 2020-07-08 MED ORDER — FENTANYL CITRATE (PF) 100 MCG/2ML IJ SOLN
25.0000 ug | INTRAMUSCULAR | Status: DC | PRN
  Administered 2020-07-08: 25 ug via INTRAVENOUS

## 2020-07-08 MED ORDER — IBUPROFEN 200 MG PO TABS
ORAL_TABLET | ORAL | Status: AC
  Filled 2020-07-08: qty 3

## 2020-07-08 MED ORDER — DEXAMETHASONE SODIUM PHOSPHATE 10 MG/ML IJ SOLN
INTRAMUSCULAR | Status: AC
  Filled 2020-07-08: qty 1

## 2020-07-08 MED ORDER — ONDANSETRON HCL 4 MG/2ML IJ SOLN
INTRAMUSCULAR | Status: DC | PRN
  Administered 2020-07-08: 4 mg via INTRAVENOUS

## 2020-07-08 MED ORDER — ONDANSETRON HCL 4 MG/2ML IJ SOLN
4.0000 mg | Freq: Four times a day (QID) | INTRAMUSCULAR | Status: DC | PRN
  Administered 2020-07-08: 4 mg via INTRAVENOUS

## 2020-07-08 MED ORDER — ZOLPIDEM TARTRATE 5 MG PO TABS: 5.0000 mg | ORAL_TABLET | Freq: Every evening | ORAL | Status: DC | PRN

## 2020-07-08 MED ORDER — ROCURONIUM BROMIDE 10 MG/ML (PF) SYRINGE
PREFILLED_SYRINGE | INTRAVENOUS | Status: AC
  Filled 2020-07-08: qty 10

## 2020-07-08 MED ORDER — ALUM & MAG HYDROXIDE-SIMETH 200-200-20 MG/5ML PO SUSP: 30.0000 mL | ORAL | Status: DC | PRN

## 2020-07-08 MED ORDER — LIDOCAINE HCL (PF) 2 % IJ SOLN
INTRAMUSCULAR | Status: AC
  Filled 2020-07-08: qty 10

## 2020-07-08 MED ORDER — FENTANYL CITRATE (PF) 100 MCG/2ML IJ SOLN
INTRAMUSCULAR | Status: AC
  Filled 2020-07-08: qty 2

## 2020-07-08 MED ORDER — PANTOPRAZOLE SODIUM 40 MG PO TBEC: 40.0000 mg | DELAYED_RELEASE_TABLET | Freq: Every day | ORAL | Status: DC

## 2020-07-08 MED ORDER — DIPHENHYDRAMINE HCL 50 MG/ML IJ SOLN
INTRAMUSCULAR | Status: AC
  Filled 2020-07-08: qty 1

## 2020-07-08 MED ORDER — MENTHOL 3 MG MT LOZG: 1.0000 | LOZENGE | OROMUCOSAL | Status: DC | PRN

## 2020-07-08 MED ORDER — PROMETHAZINE HCL 25 MG/ML IJ SOLN: 6.2500 mg | INTRAMUSCULAR | Status: DC | PRN

## 2020-07-08 MED ORDER — MIDAZOLAM HCL 2 MG/2ML IJ SOLN
INTRAMUSCULAR | Status: AC
  Filled 2020-07-08: qty 2

## 2020-07-08 MED ORDER — SODIUM CHLORIDE 0.9 % IV SOLN
2.0000 g | INTRAVENOUS | Status: AC
  Administered 2020-07-08: 2 g via INTRAVENOUS
  Filled 2020-07-08: qty 2

## 2020-07-08 MED ORDER — SIMETHICONE 80 MG PO CHEW: 80.0000 mg | CHEWABLE_TABLET | Freq: Four times a day (QID) | ORAL | Status: DC | PRN

## 2020-07-08 MED ORDER — MONTELUKAST SODIUM 10 MG PO TABS
10.0000 mg | ORAL_TABLET | Freq: Every day | ORAL | Status: DC
  Filled 2020-07-08: qty 1

## 2020-07-08 MED ORDER — SODIUM CHLORIDE 0.9 % IR SOLN
Status: DC | PRN
  Administered 2020-07-08: 1000 mL

## 2020-07-08 MED ORDER — OXYCODONE HCL 5 MG PO TABS: 5.0000 mg | ORAL_TABLET | ORAL | 0 refills | Status: DC | PRN

## 2020-07-08 MED ORDER — ACETAMINOPHEN 500 MG PO TABS
1000.0000 mg | ORAL_TABLET | ORAL | Status: AC
  Administered 2020-07-08: 1000 mg via ORAL

## 2020-07-08 MED ORDER — MIDAZOLAM HCL 2 MG/2ML IJ SOLN
INTRAMUSCULAR | Status: DC | PRN
  Administered 2020-07-08: 2 mg via INTRAVENOUS

## 2020-07-08 MED ORDER — SODIUM CHLORIDE 0.9 % IV SOLN
INTRAVENOUS | Status: DC | PRN
  Administered 2020-07-08: 10 mL
  Administered 2020-07-08: 110 mL

## 2020-07-08 MED ORDER — POVIDONE-IODINE 10 % EX SWAB: 2.0000 "application " | Freq: Once | CUTANEOUS | Status: DC

## 2020-07-08 SURGICAL SUPPLY — 62 items
ADH SKN CLS APL DERMABOND .7 (GAUZE/BANDAGES/DRESSINGS) ×1
APL SRG 38 LTWT LNG FL B (MISCELLANEOUS) ×1
APPLICATOR ARISTA FLEXITIP XL (MISCELLANEOUS) ×3 IMPLANT
BAG DECANTER FOR FLEXI CONT (MISCELLANEOUS) ×3 IMPLANT
CATH FOLEY 3WAY  5CC 16FR (CATHETERS) ×2
CATH FOLEY 3WAY 5CC 16FR (CATHETERS) ×1 IMPLANT
CELLS DAT CNTRL 66122 CELL SVR (MISCELLANEOUS) ×1 IMPLANT
COVER BACK TABLE 60X90IN (DRAPES) ×3 IMPLANT
COVER TIP SHEARS 8 DVNC (MISCELLANEOUS) ×1 IMPLANT
COVER TIP SHEARS 8MM DA VINCI (MISCELLANEOUS) ×2
DEFOGGER SCOPE WARMER CLEARIFY (MISCELLANEOUS) ×3 IMPLANT
DERMABOND ADVANCED (GAUZE/BANDAGES/DRESSINGS) ×2
DERMABOND ADVANCED .7 DNX12 (GAUZE/BANDAGES/DRESSINGS) ×1 IMPLANT
DILATOR CANAL MILEX (MISCELLANEOUS) ×3 IMPLANT
DRAPE ARM DVNC X/XI (DISPOSABLE) ×4 IMPLANT
DRAPE COLUMN DVNC XI (DISPOSABLE) ×1 IMPLANT
DRAPE DA VINCI XI ARM (DISPOSABLE) ×8
DRAPE DA VINCI XI COLUMN (DISPOSABLE) ×2
DRAPE UTILITY 15X26 TOWEL STRL (DRAPES) ×3 IMPLANT
DURAPREP 26ML APPLICATOR (WOUND CARE) ×3 IMPLANT
ELECT REM PT RETURN 9FT ADLT (ELECTROSURGICAL) ×3
ELECTRODE REM PT RTRN 9FT ADLT (ELECTROSURGICAL) ×1 IMPLANT
GLOVE SURG ENC TEXT LTX SZ6.5 (GLOVE) ×15 IMPLANT
GLOVE SURG POLYISO LF SZ7 (GLOVE) ×12 IMPLANT
GLOVE SURG UNDER POLY LF SZ6.5 (GLOVE) ×21 IMPLANT
GLOVE SURG UNDER POLY LF SZ7 (GLOVE) ×21 IMPLANT
GOWN STRL REUS W/TWL LRG LVL3 (GOWN DISPOSABLE) ×3 IMPLANT
HEMOSTAT ARISTA ABSORB 3G PWDR (HEMOSTASIS) ×3 IMPLANT
IRRIG SUCT STRYKERFLOW 2 WTIP (MISCELLANEOUS) ×3
IRRIGATION SUCT STRKRFLW 2 WTP (MISCELLANEOUS) ×1 IMPLANT
IV NS 1000ML (IV SOLUTION) ×3
IV NS 1000ML BAXH (IV SOLUTION) ×1 IMPLANT
KIT TURNOVER CYSTO (KITS) ×3 IMPLANT
LEGGING LITHOTOMY PAIR STRL (DRAPES) ×3 IMPLANT
MANIFOLD NEPTUNE II (INSTRUMENTS) ×3 IMPLANT
OBTURATOR OPTICAL STANDARD 8MM (TROCAR) ×2
OBTURATOR OPTICAL STND 8 DVNC (TROCAR) ×1
OBTURATOR OPTICALSTD 8 DVNC (TROCAR) ×1 IMPLANT
OCCLUDER COLPOPNEUMO (BALLOONS) ×3 IMPLANT
PACK ROBOT WH (CUSTOM PROCEDURE TRAY) ×3 IMPLANT
PACK ROBOTIC GOWN (GOWN DISPOSABLE) ×3 IMPLANT
PACK TRENDGUARD 450 HYBRID PRO (MISCELLANEOUS) ×1 IMPLANT
PAD OB MATERNITY 4.3X12.25 (PERSONAL CARE ITEMS) ×3 IMPLANT
PAD PREP 24X48 CUFFED NSTRL (MISCELLANEOUS) ×3 IMPLANT
PROTECTOR NERVE ULNAR (MISCELLANEOUS) ×3 IMPLANT
RTRCTR WOUND ALEXIS 18CM MED (MISCELLANEOUS) ×3
SEAL CANN UNIV 5-8 DVNC XI (MISCELLANEOUS) ×4 IMPLANT
SEAL XI 5MM-8MM UNIVERSAL (MISCELLANEOUS) ×8
SEALER VESSEL DA VINCI XI (MISCELLANEOUS) ×2
SEALER VESSEL EXT DVNC XI (MISCELLANEOUS) ×1 IMPLANT
SET TRI-LUMEN FLTR TB AIRSEAL (TUBING) ×3 IMPLANT
SUT VIC AB 0 CT1 27 (SUTURE) ×6
SUT VIC AB 0 CT1 27XBRD ANBCTR (SUTURE) ×2 IMPLANT
SUT VICRYL 0 UR6 27IN ABS (SUTURE) IMPLANT
SUT VICRYL RAPIDE 4/0 PS 2 (SUTURE) ×9 IMPLANT
SUT VLOC 180 0 9IN  GS21 (SUTURE) ×2
SUT VLOC 180 0 9IN GS21 (SUTURE) ×1 IMPLANT
TIP UTERINE 6.7X10CM GRN DISP (MISCELLANEOUS) ×3 IMPLANT
TOWEL OR 17X26 10 PK STRL BLUE (TOWEL DISPOSABLE) ×3 IMPLANT
TRENDGUARD 450 HYBRID PRO PACK (MISCELLANEOUS) ×3
TROCAR PORT AIRSEAL 8X120 (TROCAR) ×3 IMPLANT
WATER STERILE IRR 500ML POUR (IV SOLUTION) ×3 IMPLANT

## 2020-07-08 NOTE — Anesthesia Procedure Notes (Addendum)
Procedure Name: Intubation Date/Time: 07/08/2020 8:09 AM Performed by: Mechele Claude, CRNA Pre-anesthesia Checklist: Patient identified, Emergency Drugs available, Suction available and Patient being monitored Patient Re-evaluated:Patient Re-evaluated prior to induction Oxygen Delivery Method: Circle system utilized Preoxygenation: Pre-oxygenation with 100% oxygen Induction Type: IV induction and Cricoid Pressure applied Ventilation: Mask ventilation without difficulty and Oral airway inserted - appropriate to patient size Laryngoscope Size: Mac and 3 Grade View: Grade II Tube type: Oral Tube size: 7.0 mm Number of attempts: 1 Airway Equipment and Method: Stylet and Oral airway Placement Confirmation: ETT inserted through vocal cords under direct vision, positive ETCO2 and breath sounds checked- equal and bilateral Secured at: 21 cm Tube secured with: Tape Dental Injury: Teeth and Oropharynx as per pre-operative assessment

## 2020-07-08 NOTE — H&P (Signed)
Date of Initial H&P: 07/07/2020  History reviewed, patient examined, no change in status, stable for surgery.

## 2020-07-08 NOTE — Op Note (Signed)
07/08/2020  10:44 AM  PATIENT:  Kathy Chaney  45 y.o. female  PRE-OPERATIVE DIAGNOSIS:  Fibroids (D21.9) Dysmenorrhea   POST-OPERATIVE DIAGNOSIS:  Fibroids (D21.9) Dysmenorrhea   PROCEDURE:  Procedure(s): XI ROBOTIC ASSISTED LAPAROSCOPIC HYSTERECTOMY AND BILATERAL SALPINGECTOMY. (Bilateral)  SURGEON:  Surgeon(s) and Role:    Christophe Louis, MD - Primary  PHYSICIAN ASSISTANT:   ASSISTANTS: Gaylord Shih RNFA assisted due to complexity of the anatomy    ANESTHESIA:   general  EBL:  25 mL   BLOOD ADMINISTERED:none  DRAINS: Urinary Catheter (Foley)   LOCAL MEDICATIONS USED:  OTHER ropivicaine   SPECIMEN:  Source of Specimen:  Uterus cervix and bilateral fallopian tubes... Weight of specimen 572 grams  DISPOSITION OF SPECIMEN:  PATHOLOGY  COUNTS:  YES  TOURNIQUET:  * No tourniquets in log *  DICTATION: .Note written in [place]: Admit for overnight observation  PATIENT DISPOSITION:  PACU - hemodynamically stable.   Delay start of Pharmacological VTE agent (>24hrs) due to surgical blood loss or risk of bleeding: not applicable   Findings: Normal external genitalia and vaginal mucosa. Normal appearing cervix. Adhesion of the omentum to the anterior abdominal wall. Normal ovaries bilaterally. Large fibroid uterus. Displaced filschie clip in pelvis. Normal fallopian tube with appearance of previous tubal ligation.   Procedure: The patient was taken to the operating room where she was placed under general anesthesia.Time out was performed. Marland Kitchen She was placed in dorsal lithotomy position and prepped and draped in the usual sterile fashion. A weighted speculum was placed into the vagina. A Deaver was placed anteriorly for retraction. The anterior lip of the cervix was grasped with a single-tooth tenaculum. The vaginal mucosa was injected with 2.5 cc of ropivacaine at the 2/4/ 8 and 10 o'clock positions. The uterus was sounded to 8 cm. the cervix was dilated to 6 mm  . 0 vicryl suture placed at the 12 and 6:00 positions Of the cervix to facilitate placement of a Ru mi uterine manipulator. The manipulator was placed without difficulty. Weighted speculum and Deaver were removed .   Attention was turned to the patient's abdomen where a 8 mm trocar was placed 2 cm above the umbilicus. under direct visualization . The pneumoperitoneum was achieved with PCO2 gas. The laparoscope was removed. 60 cc of ropivacaine were injected into the abdominal cavity. The laparoscope was reinserted. An 8 mm trocar was placed in the right upper quadrant 16 centimeters from the umbilicus.later connected to robotic arm #4). An 8MM incision was made in the Right upper quadrant TROCAR WAS PLACED 8 cm from the umbilicus. Later connected to robotic arm #3. An 8 mm incision was made in the left upper quadrant 16 cm from the umbilicus and connected to robot arm #1. Marland Kitchen Attention was turned to the left upper quadrant where a 8 mm midclavicular assistant trocar was placed. ( All incision sites were injected with 10cc of ropivacaine prior to port placement. )   Once all ports had been placed under direct visualization.The laparoscope was removed and the [place] robotic system was thin right-sided docked. The robotic arms were connected to the corresponding trocars as listed above. The laparoscope was then reinserted. The long tip bipolar forceps were placed into port #1. The  pro-grasp was  placed in the port #4. A vessel sealer was placed in port #3 ( later alternated with monopolar scissors.) . All instruments were directed into the pelvis under direct visualization.    Attention was turned  to the surgeons console.. Omentum was excised from the anterior abdominal wall. The left mesosalpinx and left utero-ovarian ligament was cauterized and transected with the vessel sealer The broad ligament was cauterized and transected with the vessel sealer .The round ligament was cauterized and transected with the  vessel sealer  The anterior leaf of broad ligament was incised along the bladder reflection to the midline.  The right  mesosalpinx and right utero-ovarian ligament was cauterized and transected with the vessel sealer. The right broad ligament was cauterized and transected with the vessel sealer. The right round ligament was cauterized and transected with the vessel sealer The broad ligament was incised to the midline. The bladder was dissected off the lower uterine segments of the cervix via sharp and blunt dissection.   The uterine arteries were skeleton bilaterally. They were  cauterized and transected with the vessel sealer The KOH ring was identified. The anterior colpotomy was performed followed by the posterior colpotomy. Once the uterus,cervix and bilateral fallopian tubes were completely excised was removed through the vagina. The  bipolar forceps and scissors were removed and log tip forceps were placed in the port #1 and the needle driver was placed in to port #3.  The vaginal cuff was closed with running suture if 0 v-lock. The pelvis was irrigated. Marland KitchenMarland KitchenMarland KitchenExcellent hemostasis was noted. Arista was placed along the vaginal cuff.  All pelvic pedicles were examined and hemostasis was noted.  All instruments removed from the ports. All ports were removed under direct Visualization. The pneumoperitoneum was released. The skin incisions were closed with 4-0 Vicryl and then covered with Derma bond.     Sponge lap and needle counts weIre correct x. The patient was awakened from anesthesia and taken to the recovery room in stable condition.

## 2020-07-08 NOTE — Transfer of Care (Signed)
Immediate Anesthesia Transfer of Care Note  Patient: Kathy Chaney  Procedure(s) Performed: Procedure(s) (LRB): XI ROBOTIC ASSISTED LAPAROSCOPIC HYSTERECTOMY AND BILATERAL SALPINGECTOMY. (Bilateral)  Patient Location: PACU  Anesthesia Type: General  Level of Consciousness: awake, alert  and oriented  Airway & Oxygen Therapy: Patient Spontanous Breathing and Patient connected to nasal cannula oxygen  Post-op Assessment: Report given to PACU RN and Post -op Vital signs reviewed and stable  Post vital signs: Reviewed and stable  Complications: No apparent anesthesia complications  Last Vitals:  Vitals Value Taken Time  BP 132/84 07/08/20 1036  Temp 36.4 C 07/08/20 1035  Pulse 73 07/08/20 1038  Resp 15 07/08/20 1038  SpO2 97 % 07/08/20 1038  Vitals shown include unvalidated device data.  Last Pain:  Vitals:   07/08/20 0647  PainSc: 0-No pain      Patients Stated Pain Goal: 5 (62/83/15 1761)  Complications: No notable events documented.

## 2020-07-08 NOTE — Anesthesia Preprocedure Evaluation (Addendum)
Anesthesia Evaluation  Patient identified by MRN, date of birth, ID band Patient awake    Reviewed: Allergy & Precautions, NPO status , Patient's Chart, lab work & pertinent test results, reviewed documented beta blocker date and time   Airway Mallampati: II  TM Distance: >3 FB Neck ROM: Full    Dental  (+) Dental Advisory Given, Missing   Pulmonary former smoker,    Pulmonary exam normal breath sounds clear to auscultation       Cardiovascular hypertension, Pt. 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  Reproductive/Obstetrics Uterine fibroids                             Anesthesia Physical Anesthesia Plan  ASA: 2  Anesthesia Plan: General   Post-op Pain Management:    Induction: Intravenous  PONV Risk Score and Plan: 4 or greater and Midazolam, Dexamethasone, Ondansetron and Diphenhydramine  Airway Management Planned: Oral ETT  Additional Equipment:   Intra-op Plan:   Post-operative Plan: Extubation in OR  Informed Consent: I have reviewed the patients History and Physical, chart, labs and discussed the procedure including the risks, benefits and alternatives for the proposed anesthesia with the patient or authorized representative who has indicated his/her understanding and acceptance.     Dental advisory given  Plan Discussed with: CRNA  Anesthesia Plan Comments:        Anesthesia Quick Evaluation

## 2020-07-09 DIAGNOSIS — D259 Leiomyoma of uterus, unspecified: Secondary | ICD-10-CM | POA: Diagnosis not present

## 2020-07-09 LAB — SURGICAL PATHOLOGY

## 2020-07-09 LAB — CBC
HCT: 37.5 % (ref 36.0–46.0)
Hemoglobin: 12.4 g/dL (ref 12.0–15.0)
MCH: 30.7 pg (ref 26.0–34.0)
MCHC: 33.1 g/dL (ref 30.0–36.0)
MCV: 92.8 fL (ref 80.0–100.0)
Platelets: 257 10*3/uL (ref 150–400)
RBC: 4.04 MIL/uL (ref 3.87–5.11)
RDW: 13.9 % (ref 11.5–15.5)
WBC: 11.7 10*3/uL — ABNORMAL HIGH (ref 4.0–10.5)
nRBC: 0 % (ref 0.0–0.2)

## 2020-07-09 MED ORDER — OXYCODONE HCL 5 MG PO TABS
ORAL_TABLET | ORAL | Status: AC
  Filled 2020-07-09: qty 1

## 2020-07-09 MED ORDER — ACETAMINOPHEN 500 MG PO TABS
ORAL_TABLET | ORAL | Status: AC
  Filled 2020-07-09: qty 2

## 2020-07-09 MED ORDER — IBUPROFEN 200 MG PO TABS
ORAL_TABLET | ORAL | Status: AC
  Filled 2020-07-09: qty 3

## 2020-07-09 NOTE — Anesthesia Postprocedure Evaluation (Signed)
Anesthesia Post Note  Patient: Kathy Chaney  Procedure(s) Performed: XI ROBOTIC ASSISTED LAPAROSCOPIC HYSTERECTOMY AND BILATERAL SALPINGECTOMY. (Bilateral: Abdomen)     Patient location during evaluation: PACU Anesthesia Type: General Level of consciousness: awake and alert Pain management: pain level controlled Vital Signs Assessment: post-procedure vital signs reviewed and stable Respiratory status: spontaneous breathing, nonlabored ventilation, respiratory function stable and patient connected to nasal cannula oxygen Cardiovascular status: blood pressure returned to baseline and stable Postop Assessment: no apparent nausea or vomiting Anesthetic complications: no   No notable events documented.  Last Vitals:  Vitals:   07/09/20 0221 07/09/20 0533  BP: 123/76 116/72  Pulse: 76 69  Resp: 18 18  Temp: 37 C 37 C  SpO2: 96% 96%    Last Pain:  Vitals:   07/09/20 0533  PainSc: 5                  Catalina Gravel

## 2020-07-10 ENCOUNTER — Encounter (HOSPITAL_BASED_OUTPATIENT_CLINIC_OR_DEPARTMENT_OTHER): Payer: Self-pay | Admitting: Obstetrics and Gynecology

## 2020-07-17 NOTE — Discharge Summary (Signed)
Physician Discharge Summary  Patient ID: Kathy Chaney MRN: [id number] DOB/AGE: September 30, 1975 45 y.o.  Admit date: 07/08/2020 Discharge date: 07/09/2020  Admission Diagnoses: Uterine fibroids and Abnormal Uterine Bleeding   Discharge Diagnoses:  Active Problems:   Fibroids   S/P laparoscopic hysterectomy   Discharged Condition: stable  Hospital Course: pt was admitted for observation after undergoing a robotic assisted laparoscopic hysterectomy with bilateral salpingectomy. She did well posoperatively with return of bowel and bladder function  Consults: None  Significant Diagnostic Studies: labs: HGB on POD #1 was 12.4  Treatments: surgery: robotic assisted laparoscopic hysterectomy with bilateral salpingectomy   Discharge Exam: Blood pressure (!) 157/95, pulse 69, temperature 98.2 F (36.8 C), resp. rate 18, height 5\' 2"  (1.575 m), weight 74.4 kg, last menstrual period 06/27/2020, SpO2 98 %. General appearance: alert, cooperative, and no distress Resp: no distress  GI: soft appropriately tender nondistended  Extremities: extremities normal, atraumatic, no cyanosis or edema Incision/Wound: well approximated no erythema or exudate   Disposition: Discharge disposition: 01-Home or Self Care       Discharge Instructions     Call MD for:  persistant nausea and vomiting   Complete by: As directed    Call MD for:  redness, tenderness, or signs of infection (pain, swelling, redness, odor or green/yellow discharge around incision site)   Complete by: As directed    Call MD for:  severe uncontrolled pain   Complete by: As directed    Call MD for:  temperature >100.4   Complete by: As directed    Diet - low sodium heart healthy   Complete by: As directed    Driving Restrictions   Complete by: As directed    Avoid driving for 1 week   Increase activity slowly   Complete by: As directed    Lifting restrictions   Complete by: As directed    Avoid lifting over 10 lbs    No wound care   Complete by: As directed    Sexual Activity Restrictions   Complete by: As directed    Avoid sex for 6-8 weeks and until approved by Dr. Landry Mellow      Allergies as of 07/09/2020       Reactions   Nitrofurantoin    Macrodantin causes heart palpitation   Other    Walberries feel fuzzy tongue itches   Amlodipine Cough        Medication List     TAKE these medications    acetaminophen 500 MG tablet Commonly known as: TYLENOL Take 2 tablets (1,000 mg total) by mouth every 8 (eight) hours as needed for mild pain or moderate pain.   atenolol 50 MG tablet Commonly known as: TENORMIN Take 50 mg by mouth every evening.   diphenhydrAMINE 25 MG tablet Commonly known as: BENADRYL Take 25 mg by mouth every 6 (six) hours as needed.   ibuprofen 600 MG tablet Commonly known as: ADVIL Take 1 tablet (600 mg total) by mouth every 6 (six) hours as needed.   montelukast 10 MG tablet Commonly known as: SINGULAIR Take 10 mg by mouth at bedtime as needed.   oxyCODONE 5 MG immediate release tablet Commonly known as: Oxy IR/ROXICODONE Take 1-2 tablets (5-10 mg total) by mouth every 4 (four) hours as needed for moderate pain.   pantoprazole 20 MG tablet Commonly known as: PROTONIX Take 40 mg by mouth daily.        Follow-up Information     Christophe Louis, MD. Go in 2  week(s).   Specialty: Obstetrics and Gynecology Contact information: [address] [place] [address] [phone]                 Signed: Christophe Louis 07/17/2020, 4:20 PM

## 2020-10-29 ENCOUNTER — Other Ambulatory Visit: Payer: Self-pay | Admitting: Family Medicine

## 2020-10-29 DIAGNOSIS — R1011 Right upper quadrant pain: Secondary | ICD-10-CM

## 2020-10-30 ENCOUNTER — Ambulatory Visit
Admission: RE | Admit: 2020-10-30 | Discharge: 2020-10-30 | Disposition: A | Discharge status: Home / Self Care | Payer: BLUE CROSS/BLUE SHIELD | Source: Ambulatory Visit | Attending: Family Medicine | Admitting: Family Medicine

## 2020-10-30 DIAGNOSIS — R1011 Right upper quadrant pain: Secondary | ICD-10-CM

## 2020-11-05 ENCOUNTER — Other Ambulatory Visit: Payer: Self-pay | Admitting: Family Medicine

## 2020-11-05 DIAGNOSIS — N281 Cyst of kidney, acquired: Secondary | ICD-10-CM

## 2020-11-06 ENCOUNTER — Other Ambulatory Visit: Payer: Self-pay | Admitting: Family Medicine

## 2020-11-06 DIAGNOSIS — K839 Disease of biliary tract, unspecified: Secondary | ICD-10-CM

## 2020-11-21 ENCOUNTER — Ambulatory Visit
Admission: RE | Admit: 2020-11-21 | Discharge: 2020-11-21 | Disposition: A | Discharge status: Home / Self Care | Payer: BLUE CROSS/BLUE SHIELD | Source: Ambulatory Visit | Attending: Family Medicine | Admitting: Family Medicine

## 2020-11-21 DIAGNOSIS — N281 Cyst of kidney, acquired: Secondary | ICD-10-CM

## 2020-11-21 MED ORDER — IOPAMIDOL (ISOVUE-300) INJECTION 61%
100.0000 mL | Freq: Once | INTRAVENOUS | Status: AC | PRN
  Administered 2020-11-21: 100 mL via INTRAVENOUS

## 2020-12-22 ENCOUNTER — Other Ambulatory Visit (HOSPITAL_COMMUNITY): Payer: Self-pay | Admitting: Interventional Radiology

## 2020-12-22 DIAGNOSIS — I671 Cerebral aneurysm, nonruptured: Secondary | ICD-10-CM

## 2021-01-02 ENCOUNTER — Ambulatory Visit (HOSPITAL_COMMUNITY)
Admission: RE | Admit: 2021-01-02 | Discharge: 2021-01-02 | Disposition: A | Discharge status: Home / Self Care | Payer: BLUE CROSS/BLUE SHIELD | Source: Ambulatory Visit | Attending: Interventional Radiology | Admitting: Interventional Radiology

## 2021-01-02 ENCOUNTER — Other Ambulatory Visit: Payer: Self-pay

## 2021-01-02 DIAGNOSIS — I671 Cerebral aneurysm, nonruptured: Secondary | ICD-10-CM | POA: Diagnosis not present

## 2021-01-07 ENCOUNTER — Telehealth (HOSPITAL_COMMUNITY): Payer: Self-pay

## 2021-01-07 NOTE — Telephone Encounter (Signed)
Pt agreed to f/u in 1 year with mra. AW  °

## 2021-01-08 ENCOUNTER — Emergency Department (HOSPITAL_COMMUNITY): Payer: BLUE CROSS/BLUE SHIELD

## 2021-01-08 ENCOUNTER — Encounter (HOSPITAL_COMMUNITY): Payer: Self-pay | Admitting: *Deleted

## 2021-01-08 ENCOUNTER — Emergency Department (HOSPITAL_COMMUNITY)
Admission: EM | Admit: 2021-01-08 | Discharge: 2021-01-08 | Disposition: A | Discharge status: Home / Self Care | Payer: BLUE CROSS/BLUE SHIELD | Attending: Emergency Medicine | Admitting: Emergency Medicine

## 2021-01-08 DIAGNOSIS — K219 Gastro-esophageal reflux disease without esophagitis: Secondary | ICD-10-CM | POA: Diagnosis not present

## 2021-01-08 DIAGNOSIS — M546 Pain in thoracic spine: Secondary | ICD-10-CM | POA: Insufficient documentation

## 2021-01-08 DIAGNOSIS — S299XXA Unspecified injury of thorax, initial encounter: Secondary | ICD-10-CM | POA: Diagnosis present

## 2021-01-08 DIAGNOSIS — Z87891 Personal history of nicotine dependence: Secondary | ICD-10-CM | POA: Diagnosis not present

## 2021-01-08 DIAGNOSIS — X58XXXA Exposure to other specified factors, initial encounter: Secondary | ICD-10-CM | POA: Insufficient documentation

## 2021-01-08 DIAGNOSIS — R1011 Right upper quadrant pain: Secondary | ICD-10-CM | POA: Insufficient documentation

## 2021-01-08 DIAGNOSIS — I10 Essential (primary) hypertension: Secondary | ICD-10-CM | POA: Insufficient documentation

## 2021-01-08 DIAGNOSIS — Z79899 Other long term (current) drug therapy: Secondary | ICD-10-CM | POA: Diagnosis not present

## 2021-01-08 DIAGNOSIS — S2231XA Fracture of one rib, right side, initial encounter for closed fracture: Secondary | ICD-10-CM | POA: Diagnosis not present

## 2021-01-08 DIAGNOSIS — Z8616 Personal history of COVID-19: Secondary | ICD-10-CM | POA: Diagnosis not present

## 2021-01-08 LAB — COMPREHENSIVE METABOLIC PANEL
ALT: 16 U/L (ref 0–44)
AST: 18 U/L (ref 15–41)
Albumin: 4.1 g/dL (ref 3.5–5.0)
Alkaline Phosphatase: 53 U/L (ref 38–126)
Anion gap: 8 (ref 5–15)
BUN: 6 mg/dL (ref 6–20)
CO2: 25 mmol/L (ref 22–32)
Calcium: 9 mg/dL (ref 8.9–10.3)
Chloride: 104 mmol/L (ref 98–111)
Creatinine, Ser: 0.87 mg/dL (ref 0.44–1.00)
GFR, Estimated: >60 mL/min (ref >60)
Glucose, Bld: 99 mg/dL (ref 70–99)
Potassium: 3.7 mmol/L (ref 3.5–5.1)
Sodium: 137 mmol/L (ref 135–145)
Total Bilirubin: 0.8 mg/dL (ref 0.3–1.2)
Total Protein: 7.1 g/dL (ref 6.5–8.1)

## 2021-01-08 LAB — CBC WITH DIFFERENTIAL/PLATELET
Abs Immature Granulocytes: 0.01 10*3/uL (ref 0.00–0.07)
Basophils Absolute: 0 10*3/uL (ref 0.0–0.1)
Basophils Relative: 0 %
Eosinophils Absolute: 0.2 10*3/uL (ref 0.0–0.5)
Eosinophils Relative: 3 %
HCT: 39.4 % (ref 36.0–46.0)
Hemoglobin: 13.5 g/dL (ref 12.0–15.0)
Immature Granulocytes: 0 %
Lymphocytes Relative: 34 %
Lymphs Abs: 2.2 10*3/uL (ref 0.7–4.0)
MCH: 30.7 pg (ref 26.0–34.0)
MCHC: 34.3 g/dL (ref 30.0–36.0)
MCV: 89.5 fL (ref 80.0–100.0)
Monocytes Absolute: 0.4 10*3/uL (ref 0.1–1.0)
Monocytes Relative: 6 %
Neutro Abs: 3.7 10*3/uL (ref 1.7–7.7)
Neutrophils Relative %: 57 %
Platelets: 277 10*3/uL (ref 150–400)
RBC: 4.4 MIL/uL (ref 3.87–5.11)
RDW: 12.7 % (ref 11.5–15.5)
WBC: 6.5 10*3/uL (ref 4.0–10.5)
nRBC: 0 % (ref 0.0–0.2)

## 2021-01-08 LAB — URINALYSIS, ROUTINE W REFLEX MICROSCOPIC
Bilirubin Urine: NEGATIVE
Glucose, UA: NEGATIVE mg/dL
Hgb urine dipstick: NEGATIVE
Ketones, ur: NEGATIVE mg/dL
Leukocytes,Ua: NEGATIVE
Nitrite: NEGATIVE
Protein, ur: NEGATIVE mg/dL
Specific Gravity, Urine: 1.009 (ref 1.005–1.030)
pH: 6 (ref 5.0–8.0)

## 2021-01-08 MED ORDER — ACETAMINOPHEN 325 MG PO TABS: 650.0000 mg | ORAL_TABLET | Freq: Four times a day (QID) | ORAL | Status: DC | PRN

## 2021-01-08 NOTE — ED Provider Notes (Signed)
[facility] EMERGENCY DEPARTMENT Provider Note   CSN: [id number] Arrival date & time: 01/08/21  1425     History Chief Complaint  Patient presents with   Abdominal Pain    Kathy Chaney is a 45 y.o. female with history significant for GERD, left kidney cyst, hypertension, status post hysterectomy and aneurysm who presents for evaluation of right upper quadrant/right flank pain that started last night.  Patient states pain is sharp and constant although waxes and wanes in severity.  She has taken Tylenol without significant improvement.  Unable to take ibuprofen due to gastritis.  Patient endorses reproducible pain of the right posterior lateral rib cage in the upper right quadrant.  She has no history of gallbladder pathology.  She denies urinary symptoms including dysuria, hematuria.  She does have some nausea but denies vomiting.  She denies fever, chills, chest pain, shortness of breath, cough, back pain, headache and dizziness.  No recent illness or sick contacts.  No previous history of disease.   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 sees dr Estil Daft yearly   COVID 02/2020   flu like symptoms x 24 hrs all symtpoms resolved   Fibroids 06/30/2020   GERD (gastroesophageal reflux disease)    Headache(784.0)    Hypertension    Peripheral vision loss    left eye   Tobacco abuse     Patient Active Problem List   Diagnosis Date Noted   Fibroids 07/08/2020   S/P laparoscopic hysterectomy 07/08/2020   Allergic rhinitis, cause unspecified 10/04/2013   Other abnormal glucose 04/10/2012   Headache(784.0) 04/10/2012   Hyperthyroidism 12/30/2011   TOBACCO USE 02/19/2010   Essential hypertension, benign 02/19/2010   INTRACRANIAL ANEURYSM 23/53/6144   HERNIA, UMBILICAL 31/54/0086   Lumbago 02/19/2010    Past  Surgical History:  Procedure Laterality Date   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     Family History  Adopted: Yes  Problem Relation Age of Onset   Alcohol abuse Neg Hx    Cancer Neg Hx    Early death Neg Hx    Heart disease Neg Hx    Hyperlipidemia Neg Hx    Hypertension Neg Hx    Stroke Neg Hx     Social History   Tobacco Use   Smoking status: Former    Packs/day: 1.00    Years: 25.00    Pack years: 25.00    Types: Cigarettes    Quit date: 2015    Years since quitting: 7.9   Smokeless tobacco: Never  Substance Use Topics   Alcohol use: Yes    Comment: social weekends   Drug use: No    Home Medications Prior to Admission medications   Medication Sig Start Date End Date Taking? Authorizing Provider  acetaminophen (TYLENOL) 500 MG tablet Take 2 tablets (1,000 mg total) by mouth every 8 (eight) hours as needed for mild pain or moderate pain. 07/08/20  Christophe Louis, MD  atenolol (TENORMIN) 50 MG tablet Take 50 mg by mouth every evening.    [provider]  diphenhydrAMINE (BENADRYL) 25 MG tablet Take 25 mg by mouth every 6 (six) hours as needed.    [provider]  ibuprofen (ADVIL) 600 MG tablet Take 1 tablet (600 mg total) by mouth every 6 (six) hours as needed. 07/08/20   Christophe Louis, MD  montelukast (SINGULAIR) 10 MG tablet Take 10 mg by mouth at bedtime as needed.    [provider]  oxyCODONE (OXY IR/ROXICODONE) 5 MG immediate release tablet Take 1-2 tablets (5-10 mg total) by mouth every 4 (four) hours as needed for moderate pain. 07/08/20   Christophe Louis, MD  pantoprazole (PROTONIX) 20 MG tablet Take 40 mg by mouth daily. 10/07/17   [provider]    Allergies    Nitrofurantoin, Other, and Amlodipine  Review of Systems   Review of Systems  Constitutional:  Negative for fever.  HENT: Negative.    Eyes: Negative.   Respiratory:  Negative for shortness of breath.   Cardiovascular: Negative.   Gastrointestinal:  Positive for abdominal pain. Negative for vomiting.  Endocrine: Negative.   Genitourinary:  Positive for flank pain.  Skin:  Negative for rash.  Neurological:  Negative for headaches.  All other systems reviewed and are negative.  Physical Exam Updated Vital Signs BP (!) 147/100 (BP Location: Right Arm)    Pulse (!) 50    Temp 98.8 F (37.1 C) (Oral)    Resp 18    LMP 06/27/2020    SpO2 98%   Physical Exam Vitals and nursing note reviewed.  Constitutional:      General: She is not in acute distress.    Appearance: She is not ill-appearing.  HENT:     Head: Atraumatic.  Eyes:     Conjunctiva/sclera: Conjunctivae normal.  Cardiovascular:     Rate and Rhythm: Normal rate and regular rhythm.     Pulses: Normal pulses.     Heart sounds: No murmur heard. Pulmonary:     Effort: Pulmonary effort is normal. No respiratory distress.     Breath sounds: Normal breath sounds.  Chest:     Chest wall: No tenderness, crepitus or edema.     Comments: There is tenderness to palpation around the level of the right ninth rib.  No crepitus, erythema, deformity or swelling. Abdominal:     General: Abdomen is flat. Bowel sounds are normal. There is no distension.     Palpations: Abdomen is soft.     Tenderness: There is abdominal tenderness in the right upper quadrant. Positive signs include Murphy's sign.     Comments: Abdomen soft, nondistended, without peritoneal signs   Musculoskeletal:        General: Normal range of motion.     Cervical back: Normal range of motion.     Thoracic back: Spasms, tenderness and bony  tenderness present.  Skin:    General: Skin is warm and dry.     Capillary Refill: Capillary refill takes less than 2 seconds.  Neurological:     General: No focal deficit present.     Mental Status: She is alert.  Psychiatric:        Mood and Affect: Mood normal.    ED Results / Procedures / Treatments   Labs (all labs ordered are listed, but only abnormal results are displayed) Labs Reviewed  COMPREHENSIVE METABOLIC PANEL  CBC WITH DIFFERENTIAL/PLATELET  URINALYSIS, ROUTINE  W REFLEX MICROSCOPIC    EKG None  Radiology US Abdomen Limited RUQ (LIVER/GB)  Result Date: 01/08/2021 CLINICAL DATA:  Right upper quadrant abdominal pain EXAM: ULTRASOUND ABDOMEN LIMITED RIGHT UPPER QUADRANT COMPARISON:  11/21/2020 FINDINGS: Gallbladder: Possible gallbladder sludge. No wall thickening or pericholecystic fluid. Sonographic Murphy's sign was not elicited. Common bile duct: Diameter: Normal, 5 mm. Liver: No focal lesion identified. Within normal limits in parenchymal echogenicity. Portal vein is patent on color Doppler imaging with normal direction of blood flow towards the liver. Other: None. IMPRESSION: Possible gallbladder sludge. No evidence of acute cholecystitis or biliary duct dilatation. No specific explanation for right upper quadrant pain. Electronically Signed   By: Abigail Miyamoto M.D.   On: 01/08/2021 15:53    Procedures Procedures   Medications Ordered in ED Medications  acetaminophen (TYLENOL) tablet 650 mg (has no administration in time range)    ED Course  I have reviewed the triage vital signs and the nursing notes.  Pertinent labs & imaging results that were available during my care of the patient were reviewed by me and considered in my medical decision making (see chart for details).    MDM Rules/Calculators/A&P                         This patient presents to the ED for concern of right back/flank pain and RUQ pain, this involves an extensive number of treatment  options, and is a complaint that carries with it a high risk of complications and morbidity.  ow suspicion for traumatic pneumothorax given patient's stable vitals, good air movement, clear lung sounds bilaterally; less concern for secondary pneumonia, stable vitals, afebrile, clear lung sounds, negative CXR; less concern for hemothorax, patient nontoxic appearing, clear lung exam; low suspicion for cardiac trauma including cardiac contusion, cardiac tamponade, heart sounds normal, nontoxic appearing, stable vitals     Additional history obtained:  History obtained solely from patient   Lab Tests:  I Ordered, reviewed, and interpreted labs.  The pertinent results include:  CMP, CBC and UA all wnl   Imaging Studies ordered:  I ordered imaging studies including RUQ ultrasound and right rib study   I independently visualized and interpreted imaging which showed no evidence of gallbladder or liver pathology on ultrasound. There is a fracture of the right 8th rib on the rib study  I agree with the radiologist interpretation    Medicines ordered and prescription drug management:  I ordered medication including Tylenol  for pain, however patient declined  I have reviewed the patients home medicines and have made adjustments as needed    Dispostion:  After consideration of the diagnostic results and the patients response to treatment feel that the patent would benefit from discharge. Rib fracture or 8th right posterior rib -  definitive fracture care was provided for a single closed rib fracture. Treatment including pain medication, discussion of possible complications including pneumonia, discussion of usage of incentive spirometry, and referral to primary care physician for further followup. Patient expresses understanding and is amenable to plan.   All labs and imaging were independently reviewed and interpreted by myself, Kathe Becton, PA-C      Final Clinical Impression(s) / ED  Diagnoses Final diagnoses:  RUQ pain  Closed fracture of one rib of right side, initial encounter    Rx / DC Orders ED Discharge Orders     None        Tonye Pearson, [place] 01/08/21 1843  Lucrezia Starch, MD 01/12/21 [id number]

## 2021-01-08 NOTE — ED Provider Notes (Signed)
Emergency Medicine Provider Triage Evaluation Note  Abiha Lukehart , a 45 y.o. female  was evaluated in triage.  Pt complains of right upper quadrant abdominal pain.  Pain began yesterday evening and has been constant since then.  Pain got progressively worse throughout yesterday evening.  At present patient rates pain 5/10 on the pain scale.  Patient endorses associated nausea.  Review of Systems  Positive: Right upper quadrant abdominal pain, nausea Negative: Fever, chills, vomiting, constipation, diarrhea, dysuria, hematuria, urinary urgency  Physical Exam  BP (!) 147/100 (BP Location: Right Arm)    Pulse (!) 50    Temp 98.8 F (37.1 C) (Oral)    Resp 18    LMP 06/27/2020    SpO2 98%  Gen:   Awake, no distress   Resp:  Normal effort  MSK:   Moves extremities without difficulty  Other:  Abdomen soft, nondistended, tenderness to right upper quadrant.  Positive Murphy sign.  No guarding or rebound tenderness.  Medical Decision Making  Medically screening exam initiated at 2:56 PM.  Appropriate orders placed.  Celine Dishman was informed that the remainder of the evaluation will be completed by another provider, this initial triage assessment does not replace that evaluation, and the importance of remaining in the ED until their evaluation is complete.  Due to right upper quadrant abdominal pain with positive Percell Miller sign will obtain ultrasound right upper quadrant as well as abdominal work-up labs.   Loni Beckwith, PA-C 01/08/21 1457    Lorelle Gibbs, DO 01/09/21 1530

## 2021-01-08 NOTE — ED Triage Notes (Signed)
Pt reports onset yesterday evening of RUQ pain. Pain radiates to back, has nausea no vomiting.

## 2021-01-08 NOTE — Discharge Instructions (Signed)
You are diagnosed with a rib fracture of the backside of your eighth rib on the right side which explains your pain.  Have attached some instructions here on how you can take care of it at home.  Unfortunately, there is not much we can do aside from manage pain at home and give it time.  Please use your incentive spirometry device which you already have few times daily to prevent developing pneumonia.  If you develop difficulty breathing, please return to the ED.  Otherwise your ribs should heal gradually over the next several weeks.

## 2021-01-08 NOTE — ED Notes (Signed)
Back from to X-ray.

## 2021-01-08 NOTE — ED Notes (Signed)
Patient transported to X-ray 

## 2021-01-08 NOTE — ED Notes (Signed)
Pt verbalized understanding of d/c instructions, meds, and followup care. Denies questions. VSS, no distress noted. Steady gait to exit with all belongings.  ?

## 2021-02-10 ENCOUNTER — Ambulatory Visit
Admission: RE | Admit: 2021-02-10 | Discharge: 2021-02-10 | Disposition: A | Discharge status: Home / Self Care | Payer: BLUE CROSS/BLUE SHIELD | Source: Ambulatory Visit | Attending: Family Medicine | Admitting: Family Medicine

## 2021-02-10 ENCOUNTER — Other Ambulatory Visit: Payer: Self-pay

## 2021-02-10 DIAGNOSIS — K839 Disease of biliary tract, unspecified: Secondary | ICD-10-CM

## 2021-02-19 ENCOUNTER — Other Ambulatory Visit: Payer: Self-pay | Admitting: Internal Medicine

## 2021-02-19 DIAGNOSIS — K8050 Calculus of bile duct without cholangitis or cholecystitis without obstruction: Secondary | ICD-10-CM

## 2021-03-14 ENCOUNTER — Other Ambulatory Visit: Payer: Self-pay

## 2021-03-14 ENCOUNTER — Ambulatory Visit
Admission: RE | Admit: 2021-03-14 | Discharge: 2021-03-14 | Disposition: A | Discharge status: Home / Self Care | Payer: BLUE CROSS/BLUE SHIELD | Source: Ambulatory Visit | Attending: Internal Medicine | Admitting: Internal Medicine

## 2021-03-14 DIAGNOSIS — K8050 Calculus of bile duct without cholangitis or cholecystitis without obstruction: Secondary | ICD-10-CM

## 2021-03-14 MED ORDER — GADOBENATE DIMEGLUMINE 529 MG/ML IV SOLN
14.0000 mL | Freq: Once | INTRAVENOUS | Status: AC | PRN
  Administered 2021-03-14: 14 mL via INTRAVENOUS

## 2021-04-13 ENCOUNTER — Other Ambulatory Visit: Payer: Self-pay | Admitting: Internal Medicine

## 2021-04-13 DIAGNOSIS — Z1231 Encounter for screening mammogram for malignant neoplasm of breast: Secondary | ICD-10-CM

## 2021-07-06 ENCOUNTER — Ambulatory Visit
Admission: RE | Admit: 2021-07-06 | Discharge: 2021-07-06 | Disposition: A | Discharge status: Home / Self Care | Payer: BLUE CROSS/BLUE SHIELD | Source: Ambulatory Visit | Attending: Internal Medicine | Admitting: Internal Medicine

## 2021-07-06 DIAGNOSIS — Z1231 Encounter for screening mammogram for malignant neoplasm of breast: Secondary | ICD-10-CM

## 2021-07-08 ENCOUNTER — Other Ambulatory Visit: Payer: Self-pay | Admitting: Internal Medicine

## 2021-07-08 DIAGNOSIS — N644 Mastodynia: Secondary | ICD-10-CM

## 2021-07-19 ENCOUNTER — Emergency Department (HOSPITAL_COMMUNITY)
Admission: EM | Admit: 2021-07-19 | Discharge: 2021-07-19 | Disposition: A | Discharge status: Home / Self Care | Payer: BLUE CROSS/BLUE SHIELD | Attending: Emergency Medicine | Admitting: Emergency Medicine

## 2021-07-19 ENCOUNTER — Other Ambulatory Visit: Payer: Self-pay

## 2021-07-19 ENCOUNTER — Emergency Department (HOSPITAL_COMMUNITY): Payer: BLUE CROSS/BLUE SHIELD

## 2021-07-19 ENCOUNTER — Encounter (HOSPITAL_COMMUNITY): Payer: Self-pay

## 2021-07-19 DIAGNOSIS — R0789 Other chest pain: Secondary | ICD-10-CM | POA: Insufficient documentation

## 2021-07-19 DIAGNOSIS — I10 Essential (primary) hypertension: Secondary | ICD-10-CM | POA: Diagnosis not present

## 2021-07-19 DIAGNOSIS — R002 Palpitations: Secondary | ICD-10-CM | POA: Insufficient documentation

## 2021-07-19 DIAGNOSIS — R079 Chest pain, unspecified: Secondary | ICD-10-CM

## 2021-07-19 DIAGNOSIS — Z87891 Personal history of nicotine dependence: Secondary | ICD-10-CM | POA: Diagnosis not present

## 2021-07-19 DIAGNOSIS — Z79899 Other long term (current) drug therapy: Secondary | ICD-10-CM | POA: Insufficient documentation

## 2021-07-19 LAB — CBC
HCT: 41.1 % (ref 36.0–46.0)
Hemoglobin: 14 g/dL (ref 12.0–15.0)
MCH: 30.4 pg (ref 26.0–34.0)
MCHC: 34.1 g/dL (ref 30.0–36.0)
MCV: 89.3 fL (ref 80.0–100.0)
Platelets: 270 10*3/uL (ref 150–400)
RBC: 4.6 MIL/uL (ref 3.87–5.11)
RDW: 12.7 % (ref 11.5–15.5)
WBC: 6.3 10*3/uL (ref 4.0–10.5)
nRBC: 0 % (ref 0.0–0.2)

## 2021-07-19 LAB — I-STAT BETA HCG BLOOD, ED (MC, WL, AP ONLY): I-stat hCG, quantitative: <5 m[IU]/mL (ref <5)

## 2021-07-19 LAB — BASIC METABOLIC PANEL
Anion gap: 11 (ref 5–15)
BUN: 6 mg/dL (ref 6–20)
CO2: 26 mmol/L (ref 22–32)
Calcium: 9.4 mg/dL (ref 8.9–10.3)
Chloride: 103 mmol/L (ref 98–111)
Creatinine, Ser: 0.75 mg/dL (ref 0.44–1.00)
GFR, Estimated: >60 mL/min (ref >60)
Glucose, Bld: 106 mg/dL — ABNORMAL HIGH (ref 70–99)
Potassium: 3.6 mmol/L (ref 3.5–5.1)
Sodium: 140 mmol/L (ref 135–145)

## 2021-07-19 LAB — TROPONIN I (HIGH SENSITIVITY)
Troponin I (High Sensitivity): 3 ng/L (ref <18)
Troponin I (High Sensitivity): 3 ng/L (ref <18)

## 2021-07-19 NOTE — ED Provider Notes (Signed)
[facility] EMERGENCY DEPARTMENT Provider Note   CSN: [id number] Arrival date & time: 07/19/21  1131     History  No chief complaint on file.   Kathy Chaney is a 46 y.o. female.  46 year old female with past medical history of hypertension, cerebral aneurysm (post coiling) and GERD presents with complaint of palpitations and elevated blood pressure.  Patient states that over the past week, her blood pressure began running high, recorded her blood pressure machine readings and took to her PCP who noted her heart rate was in the 40s and discontinued her atenolol, started her on HCTZ.  Patient states that happened 5 days ago, since that time has been experiencing palpitations.  Now with chest heaviness earlier today and discomfort in her left arm.  At this time notes that she is having palpitations and left arm discomfort.  She denies associated shortness of breath, nausea, diaphoresis. Patient is a former smoker, quit 12 years ago.  Family history significant for a half sister with heart disease.       Home Medications Prior to Admission medications   Medication Sig Start Date End Date Taking? Authorizing Provider  acetaminophen (TYLENOL) 500 MG tablet Take 2 tablets (1,000 mg total) by mouth every 8 (eight) hours as needed for mild pain or moderate pain. 07/08/20   Christophe Louis, MD  atenolol (TENORMIN) 50 MG tablet Take 50 mg by mouth every evening.    [provider]  diphenhydrAMINE (BENADRYL) 25 MG tablet Take 25 mg by mouth every 6 (six) hours as needed.    [provider]  ibuprofen (ADVIL) 600 MG tablet Take 1 tablet (600 mg total) by mouth every 6 (six) hours as needed. 07/08/20   Christophe Louis, MD  montelukast (SINGULAIR) 10 MG tablet Take 10 mg by mouth at bedtime as needed.    [provider]  oxyCODONE (OXY IR/ROXICODONE) 5 MG immediate release tablet Take 1-2 tablets (5-10 mg total) by mouth every 4 (four) hours as needed for  moderate pain. 07/08/20   Christophe Louis, MD  pantoprazole (PROTONIX) 20 MG tablet Take 40 mg by mouth daily. 10/07/17   [provider]      Allergies    Nitrofurantoin, Other, and Amlodipine    Review of Systems   Review of Systems Negative except as per HPI Physical Exam Updated Vital Signs BP (!) 134/98   Pulse 69   Temp 98.9 F (37.2 C) (Oral)   Resp 19   Ht '5\' 2"'$  (1.575 m)   Wt 71.2 kg   LMP 06/27/2020   SpO2 98%   BMI 28.72 kg/m  Physical Exam Vitals and nursing note reviewed.  Constitutional:      General: She is not in acute distress.    Appearance: She is well-developed. She is not diaphoretic.  HENT:     Head: Normocephalic and atraumatic.  Cardiovascular:     Rate and Rhythm: Normal rate and regular rhythm.     Heart sounds: Normal heart sounds.  Pulmonary:     Effort: Pulmonary effort is normal.     Breath sounds: Normal breath sounds.  Abdominal:     Palpations: Abdomen is soft.     Tenderness: There is no abdominal tenderness.  Musculoskeletal:     Cervical back: Neck supple.     Right lower leg: No edema.     Left lower leg: No edema.  Skin:    General: Skin is warm and dry.     Findings:  No erythema.  Neurological:     Mental Status: She is alert and oriented to person, place, and time.  Psychiatric:        Behavior: Behavior normal.     ED Results / Procedures / Treatments   Labs (all labs ordered are listed, but only abnormal results are displayed) Labs Reviewed  BASIC METABOLIC PANEL - Abnormal; Notable for the following components:      Result Value   Glucose, Bld 106 (*)    All other components within normal limits  CBC  I-STAT BETA HCG BLOOD, ED ([facility], [facility], [facility] ONLY)  TROPONIN I (HIGH SENSITIVITY)  TROPONIN I (HIGH SENSITIVITY)    EKG EKG Interpretation  Date/Time:  Sunday July 19 2021 11:45:14 EDT Ventricular Rate:  87 PR Interval:  178 QRS Duration: 76 QT Interval:  368 QTC Calculation: 442 R Axis:   28 Text  Interpretation: Normal sinus rhythm Cannot rule out Anterior infarct , age undetermined Abnormal ECG Confirmed by Carmin Muskrat [phone]) on 07/19/2021 2:25:39 PM  Radiology DG Chest 2 View  Result Date: 07/19/2021 CLINICAL DATA:  Chest pain, tachycardia, LEFT arm pain EXAM: CHEST - 2 VIEW COMPARISON:  01/08/2021 FINDINGS: Normal heart size, mediastinal contours, and pulmonary vascularity. Lungs clear. No pleural effusion or pneumothorax. Bones unremarkable. IMPRESSION: Normal exam. Electronically Signed   By: Lavonia Dana M.D.   On: 07/19/2021 12:16    Procedures Procedures    Medications Ordered in ED Medications - No data to display  ED Course/ Medical Decision Making/ A&P                           Medical Decision Making Amount and/or Complexity of Data Reviewed Labs: ordered. Radiology: ordered.   This patient presents to the ED for concern of palpitations, left arm discomfort, chest heaviness, this involves an extensive number of treatment options, and is a complaint that carries with it a high risk of complications and morbidity.  The differential diagnosis includes arrhythmia, ACS, thyroid dysfunction, metabolic disturbance   Co morbidities that complicate the patient evaluation  Hypertension, cerebral aneurysm, tobacco use (quit 12 years ago), GERD   Additional history obtained:  External records from outside source obtained and reviewed including recent visit to PCP on 07/13/2021 started on HCTZ 12.5 mg once daily.   Lab Tests:  I Ordered, and personally interpreted labs.  The pertinent results include: CBC unremarkable, BMP without seeming findings, initial troponin is 3, pending repeat.  hCG negative.   Imaging Studies ordered:  I ordered imaging studies including chest x-ray I independently visualized and interpreted imaging which showed no acute abnormality I agree with the radiologist interpretation   Cardiac Monitoring: / EKG:  The patient was maintained  on a cardiac monitor.  I personally viewed and interpreted the cardiac monitored which showed an underlying rhythm of: Sinus rhythm, rate 87   Problem List / ED Course / Critical interventions / Medication management  46 year old female with complaint of palpitations and chest discomfort as above.  Notes having chest palpitations at this time, normal sinus rhythm on the monitor.  Pain is not reproduced with palpation.  Blood pressure elevated on arrival, has improved while waiting in the room.  Exam unremarkable.  Plan is to assess labs, likely will discharge home after second troponin to recheck with PCP. I have reviewed the patients home medicines and have made adjustments as needed   Social Determinants of Health:  Has PCP for follow-up  Test / Admission - Considered:  Disposition pending at change of shift, signout pending repeat troponin.         Final Clinical Impression(s) / ED Diagnoses Final diagnoses:  Chest pain, unspecified type    Rx / DC Orders ED Discharge Orders     None         Roque Lias 07/19/21 1539    Carmin Muskrat, MD 07/20/21 2158

## 2021-07-19 NOTE — ED Notes (Signed)
Milk thistle, probiotic , 40+ multi vitamin and Vitamin D started taking 3 weeks ago but stopped last Monday after having bloop pressure problems

## 2021-07-19 NOTE — Discharge Instructions (Addendum)
Please return to the ED with any new or worsening symptoms Please follow-up with the cardiology team for further management.  Their number is attached to this document.  Please call and make an appointment to be seen tomorrow. Please continue taking your blood pressure medication as directed Please read the attached informational guide concerning nonspecific chest pain in adults for further information

## 2021-07-19 NOTE — ED Provider Notes (Addendum)
  Physical Exam  BP (!) 139/99   Pulse 61   Temp 98.9 F (37.2 C) (Oral)   Resp 10   Ht '5\' 2"'$  (1.575 m)   Wt 71.2 kg   LMP 06/27/2020   SpO2 100%   BMI 28.72 kg/m   Physical Exam Vitals and nursing note reviewed.  Constitutional:      General: She is not in acute distress.    Appearance: She is not toxic-appearing.  HENT:     Head: Normocephalic and atraumatic.  Eyes:     Extraocular Movements: Extraocular movements intact.     Conjunctiva/sclera: Conjunctivae normal.     Pupils: Pupils are equal, round, and reactive to light.  Cardiovascular:     Rate and Rhythm: Normal rate and regular rhythm.     Heart sounds: No murmur heard.    No friction rub. No gallop.  Pulmonary:     Effort: No respiratory distress.  Abdominal:     General: Abdomen is flat.     Tenderness: There is no abdominal tenderness.  Musculoskeletal:     Cervical back: Normal range of motion and neck supple. No tenderness.  Skin:    General: Skin is warm and dry.     Capillary Refill: Capillary refill takes less than 2 seconds.     Coloration: Skin is not jaundiced or pale.  Neurological:     Mental Status: She is alert and oriented to person, place, and time.  Psychiatric:        Behavior: Behavior normal.     Procedures  Procedures  ED Course / MDM    Medical Decision Making Amount and/or Complexity of Data Reviewed Labs: ordered. Radiology: ordered.   Patient signed out to me at shift change.  Please see previous provider note for further detail.  In short, this is a 46 year old female who presents for concern of palpitations, left chest heaviness.  The patient has a medical history of hypertension, cerebral aneurysm and GERD.  Patient states over the last week her blood pressure has begun running high, she recorded her blood pressure and took these to her PCP.  The PCP noted that the patient's heart rate was bradycardic in the 40s and discontinued her atenolol.  The patient was started on  HCTZ.  This happened 5 days ago.  Patient states since this time she has been experiencing palpitations, left sided chest heaviness.  Patient denies any shortness of breath, nausea, diaphoresis, anxiety.  Patient signed out to me pending delta troponin.  Delta troponin has resulted and is 3.  The initial troponin was 3.  Patient be discharged home at this time and advised to follow-up with cardiology for further management.  We have referred her. Patient had all of her questions answered to satisfaction prior to discharge.  The patient was given return precautions and she voiced understanding with these.  The patient is stable at this time for discharge home.           Azucena Cecil, PA-C 07/19/21 1627    Fredia Sorrow, MD 07/19/21 [phone]

## 2021-07-19 NOTE — ED Triage Notes (Signed)
Patient complains of 5 days of palpitations with left arm pain. Patient just had BP meds switched this past week, no SOB. Alert and oriented, NAD

## 2021-07-19 NOTE — ED Notes (Signed)
Given discharge instructions with verbal understanding will follow up with her PCP/Cardiologist before starting up on cardi exercise.

## 2021-07-19 NOTE — ED Provider Notes (Signed)
I provided a substantive portion of the care of this patient.  I personally performed the entirety of the history, exam, and medical decision making for this encounter.  EKG Interpretation  Date/Time:  Sunday July 19 2021 11:45:14 EDT Ventricular Rate:  87 PR Interval:  178 QRS Duration: 76 QT Interval:  368 QTC Calculation: 442 R Axis:   28 Text Interpretation: Normal sinus rhythm Cannot rule out Anterior infarct , age undetermined Abnormal ECG Confirmed by Carmin Muskrat [phone]) on 07/19/2021 2:25:39 PM  Patient seen by me along with physician assistant.  Patient concerned about left anterior chest pain as well as a feeling of palpitations.  Heart regular rate and rhythm lungs clear to auscultation  Troponins x2 are both 3.  Cardiac monitoring no evidence of any palpitation patient metabolic panel CBC without any acute findings and chest x-ray was normal.  EKG without any significant acute abnormalities.  Because patient is over the age of 69 will give referral to cardiology for consideration for echocardiogram and nonexercise stress test.  Today's work-up without any acute findings or anything of significant concern patient stable for discharge home.   Fredia Sorrow, MD 07/19/21 1610

## 2021-07-31 ENCOUNTER — Other Ambulatory Visit: Payer: BLUE CROSS/BLUE SHIELD

## 2021-08-10 ENCOUNTER — Ambulatory Visit
Admission: RE | Admit: 2021-08-10 | Discharge: 2021-08-10 | Disposition: A | Discharge status: Home / Self Care | Payer: BLUE CROSS/BLUE SHIELD | Source: Ambulatory Visit | Attending: Internal Medicine | Admitting: Internal Medicine

## 2021-08-10 ENCOUNTER — Ambulatory Visit: Payer: BLUE CROSS/BLUE SHIELD

## 2021-08-10 DIAGNOSIS — N644 Mastodynia: Secondary | ICD-10-CM

## 2021-08-17 ENCOUNTER — Other Ambulatory Visit: Payer: Self-pay

## 2021-08-17 ENCOUNTER — Emergency Department (HOSPITAL_COMMUNITY)
Admission: EM | Admit: 2021-08-17 | Discharge: 2021-08-17 | Disposition: A | Discharge status: Home / Self Care | Payer: BLUE CROSS/BLUE SHIELD | Attending: Emergency Medicine | Admitting: Emergency Medicine

## 2021-08-17 ENCOUNTER — Emergency Department (HOSPITAL_COMMUNITY): Payer: BLUE CROSS/BLUE SHIELD

## 2021-08-17 ENCOUNTER — Encounter (HOSPITAL_COMMUNITY): Payer: Self-pay | Admitting: Emergency Medicine

## 2021-08-17 DIAGNOSIS — N9489 Other specified conditions associated with female genital organs and menstrual cycle: Secondary | ICD-10-CM | POA: Diagnosis not present

## 2021-08-17 DIAGNOSIS — I10 Essential (primary) hypertension: Secondary | ICD-10-CM | POA: Diagnosis not present

## 2021-08-17 DIAGNOSIS — N12 Tubulo-interstitial nephritis, not specified as acute or chronic: Secondary | ICD-10-CM | POA: Diagnosis not present

## 2021-08-17 DIAGNOSIS — Z79899 Other long term (current) drug therapy: Secondary | ICD-10-CM | POA: Diagnosis not present

## 2021-08-17 DIAGNOSIS — E876 Hypokalemia: Secondary | ICD-10-CM

## 2021-08-17 DIAGNOSIS — R1030 Lower abdominal pain, unspecified: Secondary | ICD-10-CM | POA: Diagnosis present

## 2021-08-17 LAB — COMPREHENSIVE METABOLIC PANEL
ALT: 22 U/L (ref 0–44)
AST: 23 U/L (ref 15–41)
Albumin: 4 g/dL (ref 3.5–5.0)
Alkaline Phosphatase: 68 U/L (ref 38–126)
Anion gap: 7 (ref 5–15)
BUN: 5 mg/dL — ABNORMAL LOW (ref 6–20)
CO2: 27 mmol/L (ref 22–32)
Calcium: 9.1 mg/dL (ref 8.9–10.3)
Chloride: 102 mmol/L (ref 98–111)
Creatinine, Ser: 0.77 mg/dL (ref 0.44–1.00)
GFR, Estimated: >60 mL/min (ref >60)
Glucose, Bld: 117 mg/dL — ABNORMAL HIGH (ref 70–99)
Potassium: 3.1 mmol/L — ABNORMAL LOW (ref 3.5–5.1)
Sodium: 136 mmol/L (ref 135–145)
Total Bilirubin: 0.7 mg/dL (ref 0.3–1.2)
Total Protein: 7.1 g/dL (ref 6.5–8.1)

## 2021-08-17 LAB — URINALYSIS, ROUTINE W REFLEX MICROSCOPIC
Bilirubin Urine: NEGATIVE
Glucose, UA: NEGATIVE mg/dL
Ketones, ur: NEGATIVE mg/dL
Nitrite: NEGATIVE
Protein, ur: 100 mg/dL — AB
Specific Gravity, Urine: 1.008 (ref 1.005–1.030)
WBC, UA: >50 WBC/hpf — ABNORMAL HIGH (ref 0–5)
pH: 6 (ref 5.0–8.0)

## 2021-08-17 LAB — CBC
HCT: 39.5 % (ref 36.0–46.0)
Hemoglobin: 13.9 g/dL (ref 12.0–15.0)
MCH: 31.1 pg (ref 26.0–34.0)
MCHC: 35.2 g/dL (ref 30.0–36.0)
MCV: 88.4 fL (ref 80.0–100.0)
Platelets: 263 10*3/uL (ref 150–400)
RBC: 4.47 MIL/uL (ref 3.87–5.11)
RDW: 12.6 % (ref 11.5–15.5)
WBC: 11 10*3/uL — ABNORMAL HIGH (ref 4.0–10.5)
nRBC: 0 % (ref 0.0–0.2)

## 2021-08-17 LAB — LIPASE, BLOOD: Lipase: 34 U/L (ref 11–51)

## 2021-08-17 LAB — I-STAT BETA HCG BLOOD, ED (MC, WL, AP ONLY): I-stat hCG, quantitative: <5 m[IU]/mL (ref <5)

## 2021-08-17 MED ORDER — CEFDINIR 300 MG PO CAPS: 300.0000 mg | ORAL_CAPSULE | Freq: Two times a day (BID) | ORAL | 0 refills | Status: DC

## 2021-08-17 MED ORDER — HYDROCODONE-ACETAMINOPHEN 5-325 MG PO TABS: 1.0000 | ORAL_TABLET | Freq: Four times a day (QID) | ORAL | 0 refills | Status: DC | PRN

## 2021-08-17 MED ORDER — ONDANSETRON HCL 4 MG PO TABS
4.0000 mg | ORAL_TABLET | Freq: Once | ORAL | Status: AC
  Administered 2021-08-17: 4 mg via ORAL
  Filled 2021-08-17: qty 1

## 2021-08-17 MED ORDER — ONDANSETRON HCL 4 MG PO TABS: 4.0000 mg | ORAL_TABLET | Freq: Three times a day (TID) | ORAL | 0 refills | Status: AC | PRN

## 2021-08-17 MED ORDER — CEFDINIR 300 MG PO CAPS: 300.0000 mg | ORAL_CAPSULE | Freq: Two times a day (BID) | ORAL | 0 refills | Status: AC

## 2021-08-17 MED ORDER — SODIUM CHLORIDE 0.9 % IV SOLN
1.0000 g | Freq: Once | INTRAVENOUS | Status: AC
  Administered 2021-08-17: 1 g via INTRAVENOUS
  Filled 2021-08-17: qty 10

## 2021-08-17 MED ORDER — IOHEXOL 300 MG/ML  SOLN
100.0000 mL | Freq: Once | INTRAMUSCULAR | Status: AC | PRN
  Administered 2021-08-17: 100 mL via INTRAVENOUS

## 2021-08-17 MED ORDER — POTASSIUM CHLORIDE CRYS ER 20 MEQ PO TBCR
40.0000 meq | EXTENDED_RELEASE_TABLET | Freq: Once | ORAL | Status: AC
  Administered 2021-08-17: 40 meq via ORAL
  Filled 2021-08-17: qty 2

## 2021-08-17 NOTE — ED Triage Notes (Signed)
Pt states she has been having burning while urinating for the past two weeks. She had BV test as well. Everything was negative. Pt took azos over the weekend as her burning continued. Azo has not helped. Pt complains of abd pain now, burning that continues even when not urinating.

## 2021-08-17 NOTE — ED Provider Triage Note (Signed)
Emergency Medicine Provider Triage Evaluation Note  Kathy Chaney , a 46 y.o. female  was evaluated in triage.  Pt complains of lower abdominal and nausea which began yesterday.  Patient has been having symptoms of dysuria for the past 3 weeks and has been evaluated with STI testing, wet prep, urinalysis, and urine culture.  So far all results been negative.  She has had 2 urinalysis which were both negative.  Urine culture performed outpatient is pending.  The patient states her symptoms have continued.  Yesterday she began to have the abdominal pain and nausea.  She was advised to come to the emergency department by her provider who recommended she get an abdominal CT  Review of Systems  Positive: Abdominal pain, nausea, dysuria Negative: Shortness of breath  Physical Exam  BP (!) 138/98 (BP Location: Right Arm)   Pulse 89   Temp 98.1 F (36.7 C) (Oral)   Resp 16   LMP 06/27/2020   SpO2 100%  Gen:   Awake, no distress   Resp:  Normal effort  MSK:   Moves extremities without difficulty  Other:    Medical Decision Making  Medically screening exam initiated at 11:26 AM.  Appropriate orders placed.  Kathy Chaney was informed that the remainder of the evaluation will be completed by another provider, this initial triage assessment does not replace that evaluation, and the importance of remaining in the ED until their evaluation is complete.     Kathy Peng, PA-C 08/17/21 1129

## 2021-08-17 NOTE — ED Provider Notes (Cosign Needed)
[facility] EMERGENCY DEPARTMENT Provider Note   CSN: [id number] Arrival date & time: 08/17/21  1105     History  Chief Complaint  Patient presents with   Abdominal Pain   Dysuria    Kathy Chaney is a 46 y.o. female.  Patient presents to the hospital complaining of lower abdominal pain and nausea which began yesterday.  She also have dysuria which has been ongoing for approximately 3 weeks.  She has been evaluated as an outpatient for the dysuria and has had 2 negative urinalysis, negative BV test, negative STI screening.  After her most recent urinalysis they also run a urine culture which is pending.  The patient has had no treatment up to this point other than AZO.  She states that late yesterday she began to have pain in the lower abdomen across the entire lower abdomen.  She also had associated nausea without vomiting.  She presents today due to these new symptoms.  The patient denies chest pain, shortness of breath, vomiting, diarrhea, constipation, hematuria.  Patient has past medical history significant for hypertension, previous aneurysm, fibroids, GERD  HPI     Home Medications Prior to Admission medications   Medication Sig Start Date End Date Taking? Authorizing Provider  cefdinir (OMNICEF) 300 MG capsule Take 1 capsule (300 mg total) by mouth 2 (two) times daily for 7 days. 08/17/21 08/24/21 Yes Dorothyann Peng, PA-C  HYDROcodone-acetaminophen (NORCO/VICODIN) 5-325 MG tablet Take 1 tablet by mouth every 6 (six) hours as needed for severe pain. 08/17/21  Yes Cherlynn June B, PA-C  ondansetron (ZOFRAN) 4 MG tablet Take 1 tablet (4 mg total) by mouth every 8 (eight) hours as needed for nausea or vomiting. 08/17/21  Yes Dorothyann Peng, PA-C  acetaminophen (TYLENOL) 500 MG tablet Take 2 tablets (1,000 mg total) by mouth every 8 (eight) hours as needed for mild pain or moderate pain. 07/08/20   Christophe Louis, MD  atenolol (TENORMIN) 50 MG tablet Take 50 mg  by mouth every evening.    [provider]  diphenhydrAMINE (BENADRYL) 25 MG tablet Take 25 mg by mouth every 6 (six) hours as needed.    [provider]  ibuprofen (ADVIL) 600 MG tablet Take 1 tablet (600 mg total) by mouth every 6 (six) hours as needed. 07/08/20   Christophe Louis, MD  montelukast (SINGULAIR) 10 MG tablet Take 10 mg by mouth at bedtime as needed.    [provider]  oxyCODONE (OXY IR/ROXICODONE) 5 MG immediate release tablet Take 1-2 tablets (5-10 mg total) by mouth every 4 (four) hours as needed for moderate pain. 07/08/20   Christophe Louis, MD  pantoprazole (PROTONIX) 20 MG tablet Take 40 mg by mouth daily. 10/07/17   [provider]      Allergies    Nitrofurantoin, Other, and Amlodipine    Review of Systems   Review of Systems  Constitutional:  Negative for fever.  Respiratory:  Negative for shortness of breath.   Cardiovascular:  Negative for chest pain.  Gastrointestinal:  Positive for abdominal pain and nausea. Negative for constipation, diarrhea and vomiting.  Genitourinary:  Positive for dysuria and frequency. Negative for hematuria and vaginal discharge.    Physical Exam Updated Vital Signs BP 136/86   Pulse 87   Temp 97.9 F (36.6 C) (Oral)   Resp 16   LMP 06/27/2020   SpO2 100%  Physical Exam Vitals and nursing note reviewed.  Constitutional:      General: She  is not in acute distress. HENT:     Head: Normocephalic and atraumatic.     Mouth/Throat:     Mouth: Mucous membranes are moist.  Eyes:     Extraocular Movements: Extraocular movements intact.  Cardiovascular:     Rate and Rhythm: Normal rate and regular rhythm.     Heart sounds: Normal heart sounds.  Pulmonary:     Effort: Pulmonary effort is normal.     Breath sounds: Normal breath sounds.  Abdominal:     General: Abdomen is flat. There is no distension.     Palpations: Abdomen is soft.     Tenderness: There is abdominal tenderness in the right lower  quadrant, suprapubic area and left lower quadrant. There is no right CVA tenderness or left CVA tenderness.  Skin:    General: Skin is warm and dry.     Capillary Refill: Capillary refill takes less than 2 seconds.  Neurological:     Mental Status: She is alert and oriented to person, place, and time.     ED Results / Procedures / Treatments   Labs (all labs ordered are listed, but only abnormal results are displayed) Labs Reviewed  COMPREHENSIVE METABOLIC PANEL - Abnormal; Notable for the following components:      Result Value   Potassium 3.1 (*)    Glucose, Bld 117 (*)    BUN 5 (*)    All other components within normal limits  CBC - Abnormal; Notable for the following components:   WBC 11.0 (*)    All other components within normal limits  URINALYSIS, ROUTINE W REFLEX MICROSCOPIC - Abnormal; Notable for the following components:   Color, Urine AMBER (*)    APPearance CLOUDY (*)    Hgb urine dipstick MODERATE (*)    Protein, ur 100 (*)    Leukocytes,Ua LARGE (*)    WBC, UA >50 (*)    Bacteria, UA RARE (*)    All other components within normal limits  URINE CULTURE  LIPASE, BLOOD  I-STAT BETA HCG BLOOD, ED ([facility], [facility], [facility] ONLY)    EKG None  Radiology CT Abdomen Pelvis W Contrast  Result Date: 08/17/2021 CLINICAL DATA:  Right lower quadrant abdominal pain, dysuria and nausea. EXAM: CT ABDOMEN AND PELVIS WITH CONTRAST TECHNIQUE: Multidetector CT imaging of the abdomen and pelvis was performed using the standard protocol following bolus administration of intravenous contrast. RADIATION DOSE REDUCTION: This exam was performed according to the departmental dose-optimization program which includes automated exposure control, adjustment of the mA and/or kV according to patient size and/or use of iterative reconstruction technique. CONTRAST:  138m OMNIPAQUE IOHEXOL 300 MG/ML  SOLN COMPARISON:  Prior CT of the abdomen and pelvis on 11/21/2020 FINDINGS: Lower chest: No acute  abnormality. Hepatobiliary: No focal liver abnormality is seen. No gallstones, gallbladder wall thickening, or biliary dilatation. Pancreas: Unremarkable. No pancreatic ductal dilatation or surrounding inflammatory changes. Spleen: Normal in size without focal abnormality. Adrenals/Urinary Tract: Normal adrenal glands. No hydronephrosis or renal calculi. On the single phase of imaging there is suggestion of some enhancement of the central right renal collecting system, right renal pelvis and proximal right ureter compared to the left. Stable tiny left renal cyst has a benign appearance and requires no further follow-up. The bladder is mildly distended and there is suggestion circumferential bladder wall thickening and hyperemia of the bladder wall. Stomach/Bowel: Bowel shows no evidence of obstruction, ileus, inflammation or lesion. The appendix is normal. No free intraperitoneal air. Vascular/Lymphatic: No significant vascular  findings are present. No enlarged abdominal or pelvic lymph nodes. Reproductive: Status post hysterectomy. No adnexal masses. Both ovaries visualized. Other: No abdominal wall hernia or abnormality. No abdominopelvic ascites. Musculoskeletal: No acute or significant osseous findings. IMPRESSION: Findings suggestive of cystitis as well as ascending infection involving the right ureter and right renal collecting system. No evidence of focal abscess. Electronically Signed   By: Aletta Edouard M.D.   On: 08/17/2021 17:05    Procedures Procedures    Medications Ordered in ED Medications  cefTRIAXone (ROCEPHIN) 1 g in sodium chloride 0.9 % 100 mL IVPB (has no administration in time range)  potassium chloride SA (KLOR-CON M) CR tablet 40 mEq (has no administration in time range)  ondansetron (ZOFRAN) tablet 4 mg (has no administration in time range)  iohexol (OMNIPAQUE) 300 MG/ML solution 100 mL (100 mLs Intravenous Contrast Given 08/17/21 1652)    ED Course/ Medical Decision Making/  A&P                           Medical Decision Making Amount and/or Complexity of Data Reviewed Labs: ordered. Radiology: ordered.   Patient presents with a chief complaint of lower abdominal pain.  Differential includes but is not limited to cystitis, pyelonephritis, appendicitis, gastritis, PID, and others  I reviewed the patient's past medical history.-Previous urine culture going back 10 years showing positive culture for E. coli bacteria  I ordered and reviewed labs.  Pertinent results include urinalysis with cloudy urine, moderate hemoglobin, 100 protein, large leukocytes, greater than 50 WBC, and rare bacteria; lipase 34, potassium 3.1, WBC 11.0  I ordered and interpreted imaging including CT abdomen pelvis with contrast. Findings suggestive of cystitis as well as ascending infection  involving the right ureter and right renal collecting system. No  evidence of focal abscess.  I agree with the radiologist findings  I ordered the patient Rocephin for urinary tract infection, potassium for hypokalemia, and Zofran for nausea.  Upon reassessment his nausea had improved  The patient has CT evidence consistent with pyelonephritis.  Urinalysis is consistent with urinary tract infection.  While informing the patient of a urinary tract infection her primary care physician called and informed her that her urine culture had grown E. coli.  No evidence at this time of appendicitis on CT.  Patient has no clinical signs of cholecystitis and lipase level does not suggest any sort of pancreatitis.  Plan to discharge patient home with [facility] prescription, Zofran, and pain medication. I see no need for admission. The patient appears comfortable and her nausea is under control. She does have mild hypokalemia. Follow up with PCP.         Final Clinical Impression(s) / ED Diagnoses Final diagnoses:  Pyelonephritis    Rx / DC Orders ED Discharge Orders          Ordered    cefdinir  (OMNICEF) 300 MG capsule  2 times daily        08/17/21 1750    HYDROcodone-acetaminophen (NORCO/VICODIN) 5-325 MG tablet  Every 6 hours PRN        08/17/21 1755    ondansetron (ZOFRAN) 4 MG tablet  Every 8 hours PRN        08/17/21 1755              Dorothyann Peng, PA-C 08/17/21 1836

## 2021-08-17 NOTE — Discharge Instructions (Addendum)
You were diagnosed today with pyelonephritis, which is an infection. You were treated with an IV antibiotic here at the hospital and I have prescribed an antbiotic for you to take at home. Please complete the entire course. You also have a decreased potassium level. You were administered potassium while here today. Follow up as needed with your primary care provider for further evaluation of your pyelonephritis and hypokalemia (low potassium).

## 2021-08-17 NOTE — ED Notes (Signed)
Discharge instructions reviewed with patient. Patient verbalized understanding of instructions. Follow-up care and medications were reviewed. Patient ambulatory with steady gait. VSS upon discharge.  ?

## 2021-08-19 LAB — URINE CULTURE: Culture: >=100000 — AB

## 2021-08-20 ENCOUNTER — Telehealth: Payer: Self-pay | Admitting: *Deleted

## 2021-08-20 NOTE — Telephone Encounter (Signed)
Post ED Visit - Positive Culture Follow-up  Culture report reviewed by antimicrobial stewardship pharmacist: [facility] Team '[]'$  Elenor Quinones, Pharm.D. '[]'$  Heide Guile, Pharm.D., BCPS AQ-ID '[]'$  Parks Neptune, Pharm.D., BCPS '[]'$  Alycia Rossetti, Pharm.D., BCPS '[]'$  [place], Pharm.D., BCPS, AAHIVP '[]'$  Legrand Como, Pharm.D., BCPS, AAHIVP '[]'$  Salome Arnt, PharmD, BCPS '[]'$  Johnnette Gourd, PharmD, BCPS '[]'$  Hughes Better, PharmD, BCPS '[]'$  Leeroy Cha, PharmD '[]'$  Laqueta Linden, PharmD, BCPS '[]'$  Albertina Parr, PharmD  [place] Team '[]'$  Leodis Sias, PharmD '[]'$  Lindell Spar, PharmD '[]'$  Royetta Asal, PharmD '[]'$  Graylin Shiver, Rph '[]'$  Rema Fendt) Glennon Mac, PharmD '[]'$  Arlyn Dunning, PharmD '[]'$  Netta Cedars, PharmD '[]'$  Dia Sitter, PharmD '[]'$  Leone Haven, PharmD '[]'$  Gretta Arab, PharmD '[]'$  Theodis Shove, PharmD '[]'$  Peggyann Juba, PharmD '[]'$  Reuel Boom, PharmD   Positive urine culture Treated with Cefdinir, organism sensitive to the same and no further patient follow-up is required at this time.  Billey Gosling, PharmD  Harlon Flor Talley 08/20/2021, 1:45 PM

## 2021-09-10 ENCOUNTER — Encounter (HOSPITAL_COMMUNITY): Payer: Self-pay

## 2021-09-10 ENCOUNTER — Ambulatory Visit (HOSPITAL_COMMUNITY)
Admission: EM | Admit: 2021-09-10 | Discharge: 2021-09-10 | Disposition: A | Discharge status: Home / Self Care | Payer: BLUE CROSS/BLUE SHIELD | Attending: Internal Medicine | Admitting: Internal Medicine

## 2021-09-10 DIAGNOSIS — R109 Unspecified abdominal pain: Secondary | ICD-10-CM | POA: Diagnosis not present

## 2021-09-10 LAB — POCT URINALYSIS DIPSTICK, ED / UC
Bilirubin Urine: NEGATIVE
Glucose, UA: NEGATIVE mg/dL
Ketones, ur: NEGATIVE mg/dL
Leukocytes,Ua: NEGATIVE
Nitrite: NEGATIVE
Protein, ur: NEGATIVE mg/dL
Specific Gravity, Urine: 1.02 (ref 1.005–1.030)
Urobilinogen, UA: 0.2 mg/dL (ref 0.0–1.0)
pH: 7 (ref 5.0–8.0)

## 2021-09-10 MED ORDER — HYDROCODONE-ACETAMINOPHEN 5-325 MG PO TABS: 1.0000 | ORAL_TABLET | Freq: Four times a day (QID) | ORAL | 0 refills | Status: DC | PRN

## 2021-09-10 MED ORDER — METHOCARBAMOL 500 MG PO TABS: 500.0000 mg | ORAL_TABLET | Freq: Every evening | ORAL | 0 refills | Status: DC | PRN

## 2021-09-10 NOTE — Discharge Instructions (Addendum)
Gentle stretching exercises Heating pad use only 20 minutes on-20 minutes off cycle Take medications as prescribed Please do not drive or operate heavy machinery after taking muscle relaxants because the muscle relaxants will make you drowsy. If you have worsening or persistent abdominal pain, please go to the emergency department to be evaluated further Return to urgent care if you have any other concerns.

## 2021-09-10 NOTE — ED Triage Notes (Signed)
Onset this morning of sharp abdominal pain. When getting up the right flank is very painful with any movement.   Patient has also been nauseous with a metallic taste in her mouth. No emesis. No constipation or diarrhea. No urinary symptoms. No injuries.   Surgical abdominal history include hernia repair and hysterectomy.

## 2021-09-14 NOTE — ED Provider Notes (Signed)
[place]    CSN: [id number] Arrival date & time: 09/10/21  8185      History   Chief Complaint Chief Complaint  Patient presents with   Abdominal Pain   Flank Pain    HPI Kathy Chaney is a 46 y.o. female comes to the urgent care with 1 day history of sharp right-sided abdominal pain.  Pain started abruptly this morning when she woke up from bed.  Pain is constant, moderately severe and aggravated by movement/palpation of the right side of the abdomen.  No trauma or bruising of the right side of the abdomen.  Patient endorses feeling nauseated and with a metallic taste in her mouth.  No vomiting.  No fever or chills.  No skin rashes.  Patient denies any dysuria, urgency or frequency.  No abdominal distention or bloating.   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 sees dr Estil Daft yearly   COVID 02/2020   flu like symptoms x 24 hrs all symtpoms resolved   Fibroids 06/30/2020   GERD (gastroesophageal reflux disease)    Headache(784.0)    Hypertension    Peripheral vision loss    left eye   Tobacco abuse     Patient Active Problem List   Diagnosis Date Noted   Fibroids 07/08/2020   S/P laparoscopic hysterectomy 07/08/2020   Allergic rhinitis, cause unspecified 10/04/2013   Other abnormal glucose 04/10/2012   Headache(784.0) 04/10/2012   Hyperthyroidism 12/30/2011   TOBACCO USE 02/19/2010   Essential hypertension, benign 02/19/2010   INTRACRANIAL ANEURYSM 63/14/9702   HERNIA, UMBILICAL 63/78/5885   Lumbago 02/19/2010    Past Surgical History:  Procedure Laterality Date   ANEURYSM COILING  01/18/2010   basilar artery   HERNIA REPAIR  0277   umbilical   ROBOTIC ASSISTED LAPAROSCOPIC HYSTERECTOMY AND SALPINGECTOMY Bilateral 07/08/2020   Procedure: XI ROBOTIC ASSISTED LAPAROSCOPIC HYSTERECTOMY AND BILATERAL SALPINGECTOMY.;  Surgeon: Christophe Louis, MD;  Location: [facility];  Service:  Gynecology;  Laterality: Bilateral;   TUBAL LIGATION  2010    OB History   No obstetric history on file.      Home Medications    Prior to Admission medications   Medication Sig Start Date End Date Taking? Authorizing Provider  hydrochlorothiazide (MICROZIDE) 12.5 MG capsule Take 12.5 mg by mouth daily. 09/09/21  Yes [provider]  methocarbamol (ROBAXIN) 500 MG tablet Take 1 tablet (500 mg total) by mouth at bedtime as needed for muscle spasms. 09/10/21  Yes Lakasha Mcfall, Myrene Galas, MD  pantoprazole (PROTONIX) 20 MG tablet Take 40 mg by mouth daily. 10/07/17  Yes [provider]  acetaminophen (TYLENOL) 500 MG tablet Take 2 tablets (1,000 mg total) by mouth every 8 (eight) hours as needed for mild pain or moderate pain. 07/08/20   Christophe Louis, MD  atenolol (TENORMIN) 50 MG tablet Take 50 mg by mouth every evening.    [provider]  diphenhydrAMINE (BENADRYL) 25 MG tablet Take 25 mg by mouth every 6 (six) hours as needed.    [provider]  HYDROcodone-acetaminophen (NORCO/VICODIN) 5-325 MG tablet Take 1 tablet by mouth every 6 (six) hours as needed for severe pain. 09/10/21   Chase Picket, MD  ibuprofen (ADVIL) 600 MG tablet Take 1 tablet (600 mg total) by mouth every 6 (six) hours as needed. 07/08/20   Christophe Louis, MD  montelukast (SINGULAIR) 10 MG tablet Take 10 mg by mouth  at bedtime as needed.    [provider]  ondansetron (ZOFRAN) 4 MG tablet Take 1 tablet (4 mg total) by mouth every 8 (eight) hours as needed for nausea or vomiting. 08/17/21   Dorothyann Peng, PA-C    Family History Family History  Adopted: Yes  Problem Relation Age of Onset   Alcohol abuse Neg Hx    Cancer Neg Hx    Early death Neg Hx    Heart disease Neg Hx    Hyperlipidemia Neg Hx    Hypertension Neg Hx    Stroke Neg Hx     Social History Social History   Tobacco Use   Smoking status: Former    Packs/day: 1.00    Years: 25.00    Total pack years: 25.00     Types: Cigarettes    Quit date: 2015    Years since quitting: 8.6   Smokeless tobacco: Never  Substance Use Topics   Alcohol use: Yes    Comment: social weekends   Drug use: No     Allergies   Nitrofurantoin, Other, and Amlodipine   Review of Systems Review of Systems As per HPI  Physical Exam Triage Vital Signs ED Triage Vitals  Enc Vitals Group     BP 09/10/21 0818 (!) 146/97     Pulse Rate 09/10/21 0818 66     Resp 09/10/21 0818 16     Temp 09/10/21 0818 97.8 F (36.6 C)     Temp Source 09/10/21 0818 Oral     SpO2 09/10/21 0818 96 %     Weight 09/10/21 0822 157 lb (71.2 kg)     Height 09/10/21 0822 '5\' 2"'$  (1.575 m)     Head Circumference --      Peak Flow --      Pain Score 09/10/21 0822 8     Pain Loc --      Pain Edu? --      Excl. in [place]? --    No data found.  Updated Vital Signs BP (!) 146/97 (BP Location: Left Arm)   Pulse 66   Temp 97.8 F (36.6 C) (Oral)   Resp 16   Ht '5\' 2"'$  (1.575 m)   Wt 71.2 kg   LMP 06/27/2020   SpO2 96%   BMI 28.72 kg/m   Visual Acuity Right Eye Distance:   Left Eye Distance:   Bilateral Distance:    Right Eye Near:   Left Eye Near:    Bilateral Near:     Physical Exam Vitals and nursing note reviewed.  Constitutional:      General: She is not in acute distress.    Appearance: She is not ill-appearing.  Cardiovascular:     Rate and Rhythm: Normal rate and regular rhythm.  Pulmonary:     Effort: Pulmonary effort is normal.     Breath sounds: Normal breath sounds.  Abdominal:     General: Bowel sounds are normal.     Palpations: Abdomen is soft.     Tenderness: There is abdominal tenderness in the right upper quadrant and right lower quadrant. There is no guarding or rebound.  Neurological:     Mental Status: She is alert.      UC Treatments / Results  Labs (all labs ordered are listed, but only abnormal results are displayed) Labs Reviewed  POCT URINALYSIS DIPSTICK, ED / UC - Abnormal; Notable for  the following components:      Result Value   Hgb  urine dipstick TRACE (*)    All other components within normal limits    EKG   Radiology No results found.  Procedures Procedures (including critical care time)  Medications Ordered in UC Medications - No data to display  Initial Impression / Assessment and Plan / UC Course  I have reviewed the triage vital signs and the nursing notes.  Pertinent labs & imaging results that were available during my care of the patient were reviewed by me and considered in my medical decision making (see chart for details).     1.  Right-sided abdominal wall pain: Hydrocodone-acetaminophen as directed Robaxin at bedtime as needed for muscle spasm-precautions given Maintain adequate hydration No indication for imaging at this time Return precautions given. Final Clinical Impressions(s) / UC Diagnoses   Final diagnoses:  Abdominal wall pain     Discharge Instructions      Gentle stretching exercises Heating pad use only 20 minutes on-20 minutes off cycle Take medications as prescribed Please do not drive or operate heavy machinery after taking muscle relaxants because the muscle relaxants will make you drowsy. If you have worsening or persistent abdominal pain, please go to the emergency department to be evaluated further Return to urgent care if you have any other concerns.   ED Prescriptions     Medication Sig Dispense Auth. Provider   HYDROcodone-acetaminophen (NORCO/VICODIN) 5-325 MG tablet Take 1 tablet by mouth every 6 (six) hours as needed for severe pain. 10 tablet Jessi Pitstick, Myrene Galas, MD   methocarbamol (ROBAXIN) 500 MG tablet Take 1 tablet (500 mg total) by mouth at bedtime as needed for muscle spasms. 10 tablet Valrie Jia, Myrene Galas, MD      I have reviewed the PDMP during this encounter.   Chase Picket, MD 09/14/21 [phone]

## 2022-01-06 ENCOUNTER — Other Ambulatory Visit (HOSPITAL_COMMUNITY): Payer: Self-pay | Admitting: Interventional Radiology

## 2022-01-06 DIAGNOSIS — I671 Cerebral aneurysm, nonruptured: Secondary | ICD-10-CM

## 2022-02-26 ENCOUNTER — Ambulatory Visit (HOSPITAL_COMMUNITY)
Admission: RE | Admit: 2022-02-26 | Discharge: 2022-02-26 | Disposition: A | Discharge status: Home / Self Care | Payer: BLUE CROSS/BLUE SHIELD | Source: Ambulatory Visit | Attending: Interventional Radiology | Admitting: Interventional Radiology

## 2022-02-26 DIAGNOSIS — I671 Cerebral aneurysm, nonruptured: Secondary | ICD-10-CM

## 2022-03-01 ENCOUNTER — Telehealth (HOSPITAL_COMMUNITY): Payer: Self-pay

## 2022-03-01 NOTE — Telephone Encounter (Signed)
Pt agreed to f/u in 1 year with an mra head. AB

## 2022-06-12 IMAGING — MR MR ABDOMEN WO/W CM MRCP
13 of 20 series · 26 of 48 positions shown · IV contrast (multihance)
Comparison: CT November 21, 2020 and ultrasound February 10, 2021

CLINICAL DATA: Common duct dilation with findings suspicious for
gallstone in the cystic duct.

EXAM:
MRI ABDOMEN WITHOUT AND WITH CONTRAST (INCLUDING MRCP)
TECHNIQUE: Multiplanar multisequence MR imaging of the abdomen was performed
both before and after the administration of intravenous contrast.
Heavily T2-weighted images of the biliary and pancreatic ducts were
obtained, and three-dimensional MRCP images were rendered by post
processing.
CONTRAST:  14mL MULTIHANCE GADOBENATE DIMEGLUMINE 529 MG/ML IV SOLN

[Series 3: cor haste · coronal · 5.0mm · 0.74mm/px · 2 of 41 slices shown]
[im 1/41]
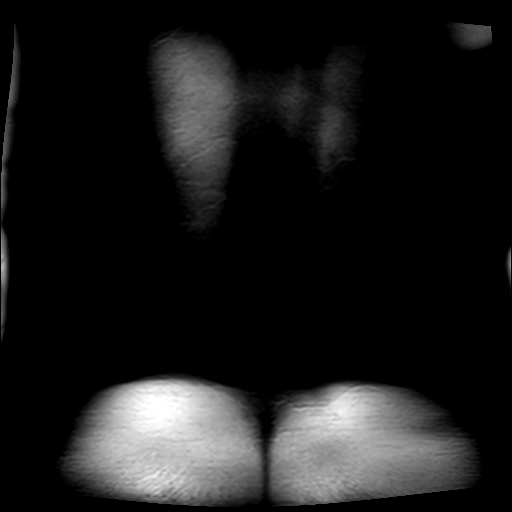
[im 41/41]
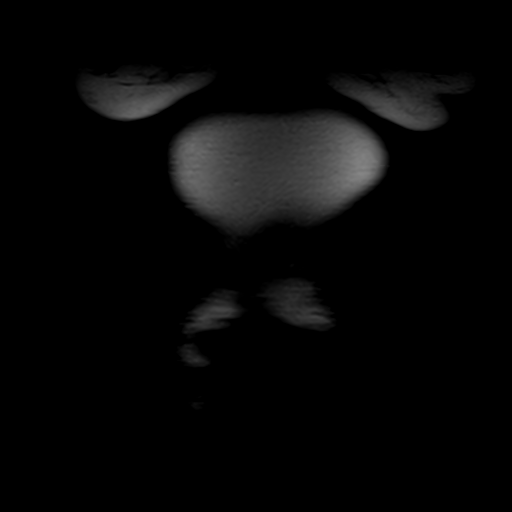

[Series 4: axial haste · axial · 6.0mm · 0.78mm/px · z∈[-81,+144]mm · 2 of 35 slices shown]
[im 1/35]
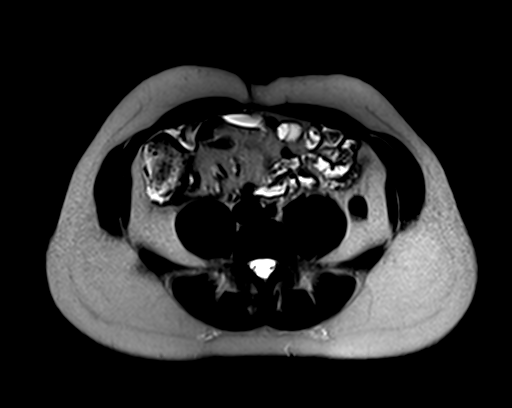
[im 35/35]
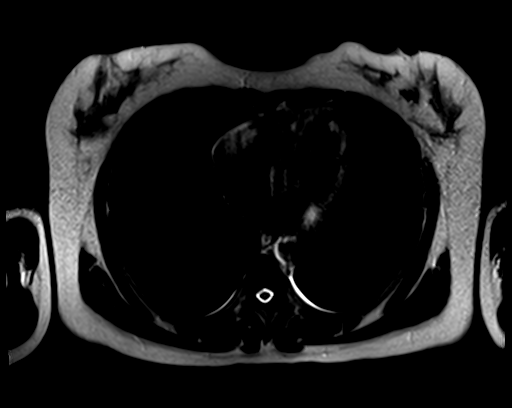

[Series 5: bSSFP · coronal · 5.0mm · 0.78mm/px · 1 of 34 slices shown (1 of 2)]
[im 1/34]
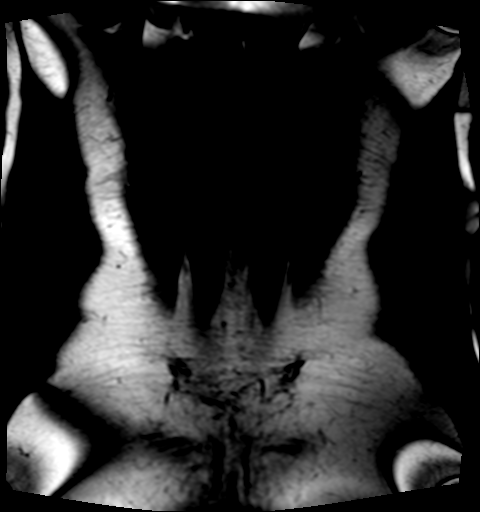

[Series 6: bSSFP · axial · 4.0mm · 0.72mm/px · z∈[-89,+151]mm · 2 of 61 slices shown (2 of 2)]
[im 1/61]
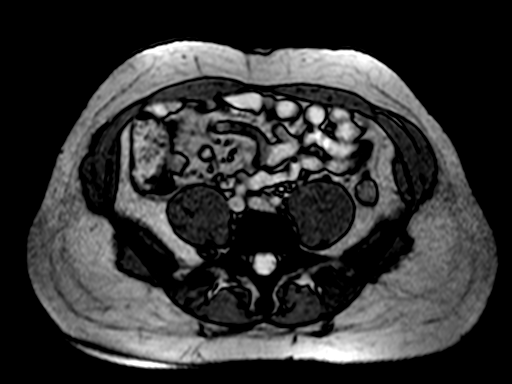
[im 61/61]
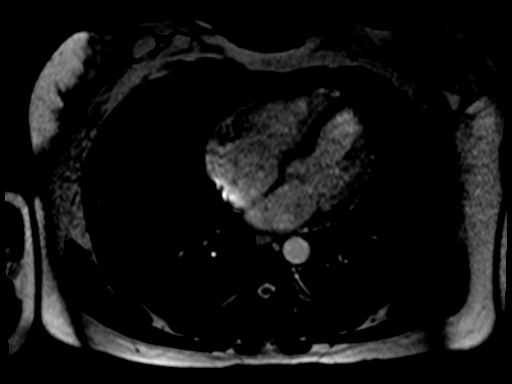

[Series 7: T1 · axial · 6.0mm · 0.74mm/px · z∈[-84,+147]mm · 2 of 72 slices shown]
[im 1/72]
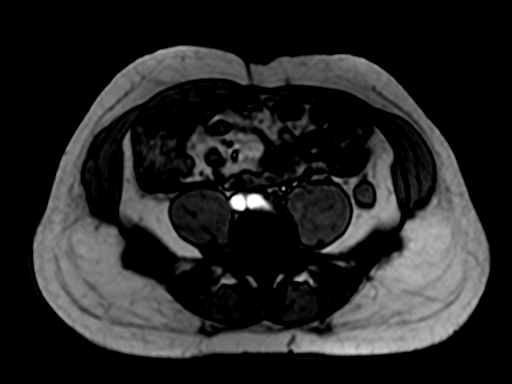
[im 72/72]
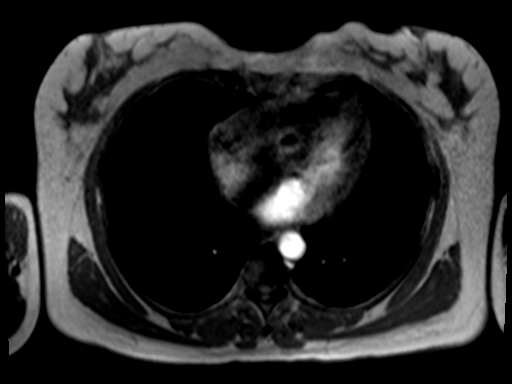

[Series 8: T2 · coronal · 3.0mm · 0.70mm/px · 2 of 56 slices shown]
[im 1/56]
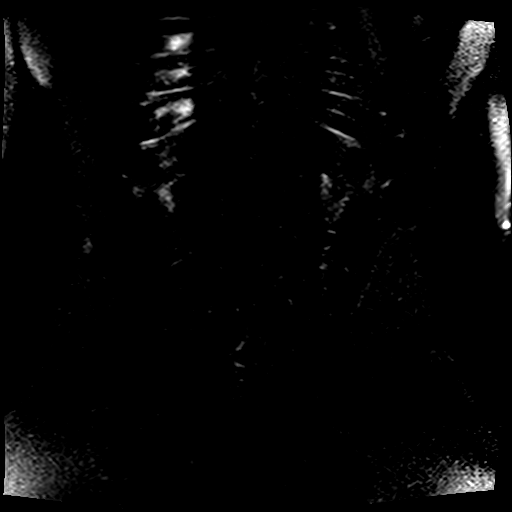
[im 56/56]
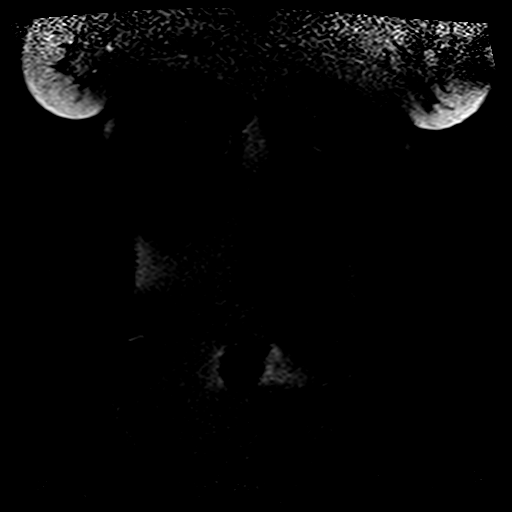

[Series 13: ep2d_diff_b50_500_800_p2_trig · axial · 6.0mm · 2.03mm/px · z∈[-35,+195]mm · 3 of 99 slices shown]
[im 1/99]
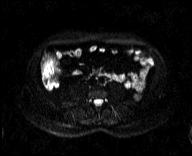
[im 50/99]
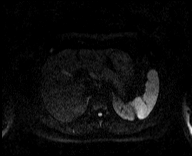
[im 99/99]
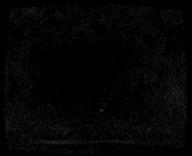

[Series 14: ep2d_diff_b50_500_800_p2_trig_adc · axial · 6.0mm · 2.03mm/px · 1 of 33 slices shown]
[im 1/33]
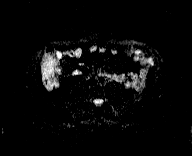

[Series 15: T2 fat-sat · axial · 6.0mm · 1.09mm/px · 1 of 32 slices shown]
[im 1/32]
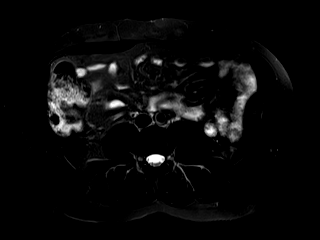

[Series 16: T1 dynamic · axial · non-contrast · 2.5mm · 0.74mm/px · z∈[-81,+156]mm · 3 of 96 slices shown]
[im 1/96]
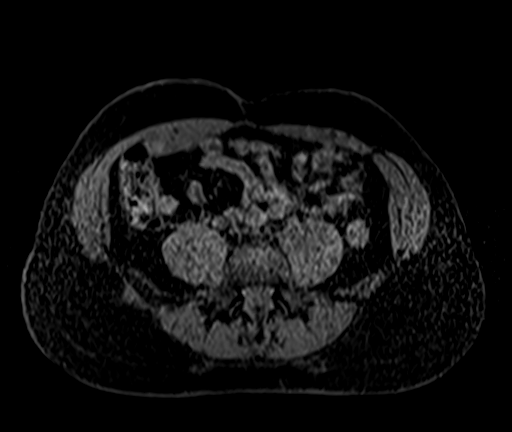
[im 48/96]
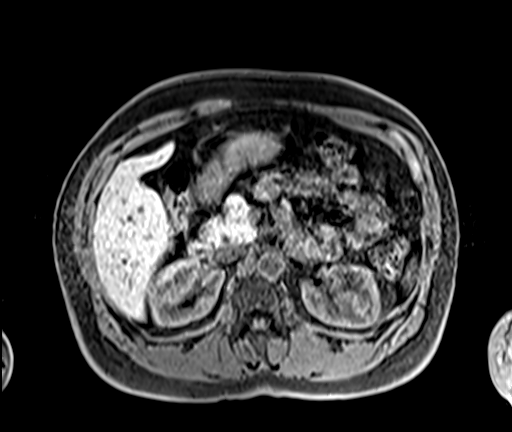
[im 96/96]
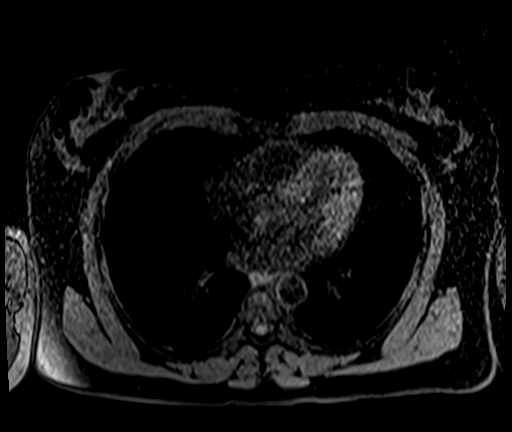

[Series 17: T1 dynamic post-contrast · axial · 2.5mm · 0.74mm/px · z∈[-81,+156]mm · 3 of 96 slices shown (1 of 3)]
[im 1/96]
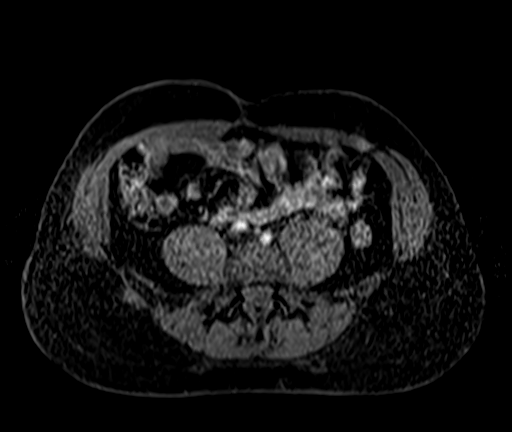
[im 48/96]
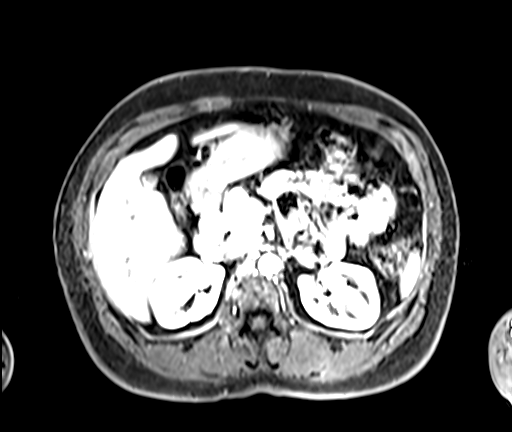
[im 96/96]
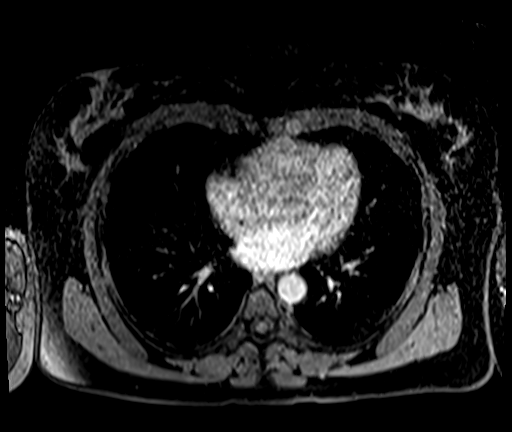

[Series 18: T1 dynamic post-contrast · axial · 2.5mm · 0.74mm/px · z∈[-81,+156]mm · 3 of 96 slices shown (2 of 3)]
[im 1/96]
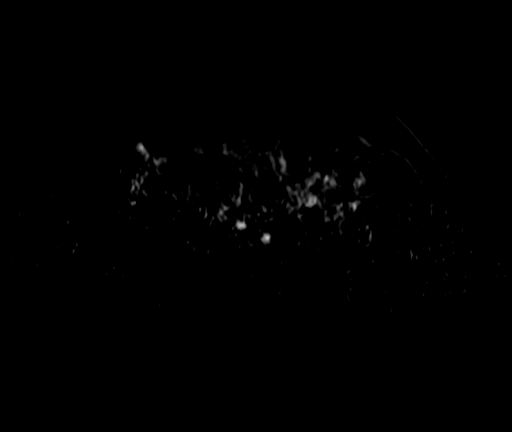
[im 48/96]
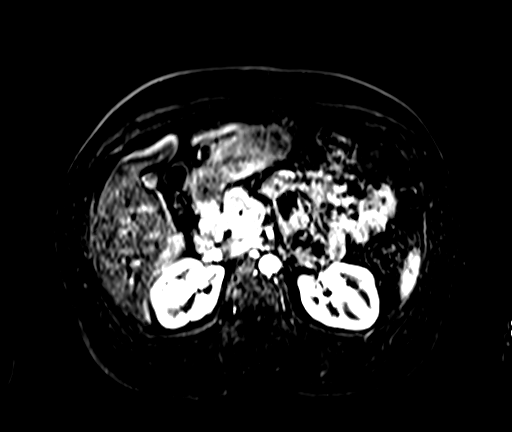
[im 96/96]
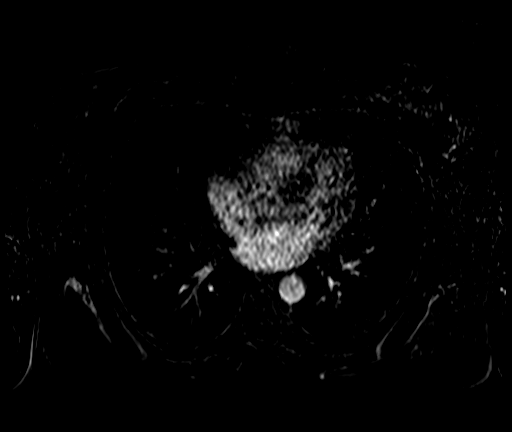

[Series 19: T1 dynamic post-contrast · axial · 2.5mm · 0.74mm/px · 1 of 96 slices shown (3 of 3)]
[im 1/96]
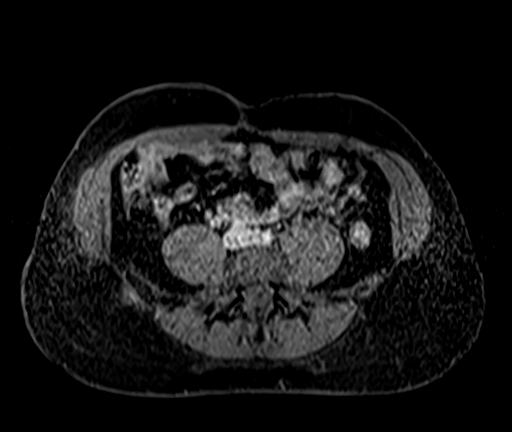

[26 of 48 positions shown; findings below may reference images not displayed]

FINDINGS: Lower chest: No acute abnormality.

Hepatobiliary: No significant hepatic steatosis. Gallbladder is
unremarkable. No biliary ductal dilation. The common bile duct
measures 5 mm. No choledocholithiasis.

Pancreas: Intrinsic T1 signal of the pancreatic parenchyma is within
normal limits. Homogeneous pancreatic parenchymal enhancement
postcontrast administration. No pancreatic ductal dilation.

Spleen:  Normal size spleen without focal splenic lesion.

Adrenals/Urinary Tract: Bilateral adrenal glands appear normal. No
hydronephrosis. Bilateral renal cysts measuring up to 10 mm on the
left.

Stomach/Bowel: Visualized portions within the abdomen are
unremarkable.

Vascular/Lymphatic: Normal caliber abdominal aorta. The portal,
splenic and superior mesenteric veins are patent. No pathologically
enlarged abdominal lymph nodes.

Other:  No abdominal free fluid.

Musculoskeletal: No suspicious bone lesions identified.
IMPRESSION: 1. No evidence of biliary ductal dilation or choledocholithiasis.
2. Nondistended gallbladder without identified cholelithiasis.

## 2022-08-09 ENCOUNTER — Other Ambulatory Visit: Payer: Self-pay | Admitting: Physician Assistant

## 2022-08-09 DIAGNOSIS — M79604 Pain in right leg: Secondary | ICD-10-CM

## 2022-08-18 ENCOUNTER — Ambulatory Visit: Admission: RE | Admit: 2022-08-18 | Payer: BLUE CROSS/BLUE SHIELD | Source: Ambulatory Visit

## 2022-08-18 DIAGNOSIS — M79605 Pain in left leg: Secondary | ICD-10-CM

## 2022-09-10 ENCOUNTER — Other Ambulatory Visit: Payer: Self-pay | Admitting: Internal Medicine

## 2022-09-10 DIAGNOSIS — Z1231 Encounter for screening mammogram for malignant neoplasm of breast: Secondary | ICD-10-CM

## 2022-10-07 ENCOUNTER — Ambulatory Visit
Admission: RE | Admit: 2022-10-07 | Discharge: 2022-10-07 | Disposition: A | Discharge status: Home / Self Care | Payer: BLUE CROSS/BLUE SHIELD | Source: Ambulatory Visit | Attending: Internal Medicine | Admitting: Internal Medicine

## 2022-10-07 DIAGNOSIS — Z1231 Encounter for screening mammogram for malignant neoplasm of breast: Secondary | ICD-10-CM

## 2022-12-19 NOTE — Progress Notes (Unsigned)
New Patient Note  RE: Kathy Chaney MRN: [id number] DOB: 08/20/1975 Date of Office Visit: 12/20/2022  Consult requested by: Ollen Bowl, MD Primary care provider: Ollen Bowl, MD  Chief Complaint: No chief complaint on file.  History of Present Illness: I had the pleasure of seeing Kathy Chaney for initial evaluation at the [facility] on 12/19/2022. She is a 47 y.o. female, who is referred here by Ollen Bowl, MD for the evaluation of allergic rhinitis.  Discussed the use of AI scribe software for clinical note transcription with the patient, who gave verbal consent to proceed.  History of Present Illness             She reports symptoms of ***. Symptoms have been going on for *** years. The symptoms are present *** all year around with worsening in ***. Other triggers include exposure to ***. Anosmia: ***. Headache: ***. She has used *** with ***fair improvement in symptoms. Sinus infections: ***. Previous work up includes: ***. Previous ENT evaluation: ***. Previous sinus imaging: ***. History of nasal polyps: ***. Last eye exam: ***. History of reflux: ***.  Assessment and Plan: Kathy Chaney is a 47 y.o. female with: ***  Assessment and Plan               No follow-ups on file.  No orders of the defined types were placed in this encounter.  Lab Orders  No laboratory test(s) ordered today    Other allergy screening: Asthma: {Blank single:19197::"yes","no"} Rhino conjunctivitis: {Blank single:19197::"yes","no"} Food allergy: {Blank single:19197::"yes","no"} Medication allergy: {Blank single:19197::"yes","no"} Hymenoptera allergy: {Blank single:19197::"yes","no"} Urticaria: {Blank single:19197::"yes","no"} Eczema:{Blank single:19197::"yes","no"} History of recurrent infections suggestive of immunodeficency: {Blank single:19197::"yes","no"}  Diagnostics: Spirometry:  Tracings reviewed. Her effort: {Blank  single:19197::"Good reproducible efforts.","It was hard to get consistent efforts and there is a question as to whether this reflects a maximal maneuver.","Poor effort, data can not be interpreted."} FVC: ***L FEV1: ***L, ***% predicted FEV1/FVC ratio: ***% Interpretation: {Blank single:19197::"Spirometry consistent with mild obstructive disease","Spirometry consistent with moderate obstructive disease","Spirometry consistent with severe obstructive disease","Spirometry consistent with possible restrictive disease","Spirometry consistent with mixed obstructive and restrictive disease","Spirometry uninterpretable due to technique","Spirometry consistent with normal pattern","No overt abnormalities noted given today's efforts"}.  Please see scanned spirometry results for details.  Skin Testing: {Blank single:19197::"Select foods","Environmental allergy panel","Environmental allergy panel and select foods","Food allergy panel","None","Deferred due to recent antihistamines use"}. *** Results discussed with patient/family.   Past Medical History: Patient Active Problem List   Diagnosis Date Noted  . Fibroids 07/08/2020  . S/P laparoscopic hysterectomy 07/08/2020  . Allergic rhinitis 10/04/2013  . Other abnormal glucose 04/10/2012  . Headache 04/10/2012  . Hyperthyroidism 12/30/2011  . TOBACCO USE 02/19/2010  . Essential hypertension, benign 02/19/2010  . 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 Umbilical hernia 02/19/2010  . Lumbago 02/19/2010   Past Medical History:  Diagnosis Date  . 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 COVID 02/2020   flu like symptoms x 24 hrs all symtpoms resolved  . Fibroids 06/30/2020  . GERD (gastroesophageal reflux disease)   . Headache(784.0)   . Hypertension   . Peripheral vision loss    left eye  . Tobacco abuse    Past Surgical History: Past Surgical History:  Procedure Laterality Date  . ANEURYSM COILING  01/18/2010    basilar artery  . HERNIA REPAIR  2011   umbilical  . ROBOTIC ASSISTED LAPAROSCOPIC HYSTERECTOMY AND SALPINGECTOMY Bilateral 07/08/2020  Procedure: XI ROBOTIC ASSISTED LAPAROSCOPIC HYSTERECTOMY AND BILATERAL SALPINGECTOMY.;  Surgeon: Gerald Leitz, MD;  Location: [facility];  Service: Gynecology;  Laterality: Bilateral;  . TUBAL LIGATION  2010   Medication List:  Current Outpatient Medications  Medication Sig Dispense Refill  . acetaminophen (TYLENOL) 500 MG tablet Take 2 tablets (1,000 mg total) by mouth every 8 (eight) hours as needed for mild pain or moderate pain. 30 tablet 0  . atenolol (TENORMIN) 50 MG tablet Take 50 mg by mouth every evening.    . diphenhydrAMINE (BENADRYL) 25 MG tablet Take 25 mg by mouth every 6 (six) hours as needed.    . hydrochlorothiazide (MICROZIDE) 12.5 MG capsule Take 12.5 mg by mouth daily.    Marland Kitchen HYDROcodone-acetaminophen (NORCO/VICODIN) 5-325 MG tablet Take 1 tablet by mouth every 6 (six) hours as needed for severe pain. 10 tablet 0  . ibuprofen (ADVIL) 600 MG tablet Take 1 tablet (600 mg total) by mouth every 6 (six) hours as needed. 30 tablet 1  . methocarbamol (ROBAXIN) 500 MG tablet Take 1 tablet (500 mg total) by mouth at bedtime as needed for muscle spasms. 10 tablet 0  . montelukast (SINGULAIR) 10 MG tablet Take 10 mg by mouth at bedtime as needed.    . ondansetron (ZOFRAN) 4 MG tablet Take 1 tablet (4 mg total) by mouth every 8 (eight) hours as needed for nausea or vomiting. 20 tablet 0  . pantoprazole (PROTONIX) 20 MG tablet Take 40 mg by mouth daily.  1   No current facility-administered medications for this visit.   Allergies: Allergies  Allergen Reactions  . Nitrofurantoin     Macrodantin causes heart palpitation  . Other     Walberries feel fuzzy tongue itches   . Amlodipine Cough   Social History: Social History   Socioeconomic History  . Marital status: Married    Spouse name: Not on file  . Number of children: Not  on file  . Years of education: Not on file  . Highest education level: Not on file  Occupational History  . Not on file  Tobacco Use  . Smoking status: Former    Current packs/day: 0.00    Average packs/day: 1 pack/day for 25.0 years (25.0 ttl pk-yrs)    Types: Cigarettes    Start date: 12    Quit date: 2015    Years since quitting: 9.9  . Smokeless tobacco: Never  Substance and Sexual Activity  . Alcohol use: Yes    Comment: social weekends  . Drug use: No  . Sexual activity: Not on file  Other Topics Concern  . Not on file  Social History Narrative  . Not on file   Social Determinants of Health   Financial Resource Strain: Not on file  Food Insecurity: Not on file  Transportation Needs: Not on file  Physical Activity: Not on file  Stress: Not on file  Social Connections: Not on file   Lives in a ***. Smoking: *** Occupation: ***  Environmental HistorySurveyor, minerals in the house: Copywriter, advertising in the family room: {Blank single:19197::"yes","no"} Carpet in the bedroom: {Blank single:19197::"yes","no"} Heating: {Blank single:19197::"electric","gas","heat pump"} Cooling: {Blank single:19197::"central","window","heat pump"} Pet: {Blank single:19197::"yes ***","no"}  Family History: Family History  Adopted: Yes  Problem Relation Age of Onset  . Alcohol abuse Neg Hx   . Cancer Neg Hx   . Early death Neg Hx   . Heart disease Neg Hx   . Hyperlipidemia Neg Hx   . Hypertension  Neg Hx   . Stroke Neg Hx    Problem                               Relation Asthma                                   *** Eczema                                *** Food allergy                          *** Allergic rhino conjunctivitis     ***  Review of Systems  Constitutional:  Negative for appetite change, chills, fever and unexpected weight change.  HENT:  Negative for congestion and rhinorrhea.   Eyes:  Negative for itching.  Respiratory:  Negative  for cough, chest tightness, shortness of breath and wheezing.   Cardiovascular:  Negative for chest pain.  Gastrointestinal:  Negative for abdominal pain.  Genitourinary:  Negative for difficulty urinating.  Skin:  Negative for rash.  Neurological:  Negative for headaches.   Objective: LMP 06/27/2020  There is no height or weight on file to calculate BMI. Physical Exam Vitals and nursing note reviewed.  Constitutional:      Appearance: Normal appearance. She is well-developed.  HENT:     Head: Normocephalic and atraumatic.     Right Ear: Tympanic membrane and external ear normal.     Left Ear: Tympanic membrane and external ear normal.     Nose: Nose normal.     Mouth/Throat:     Mouth: Mucous membranes are moist.     Pharynx: Oropharynx is clear.  Eyes:     Conjunctiva/sclera: Conjunctivae normal.  Cardiovascular:     Rate and Rhythm: Normal rate and regular rhythm.     Heart sounds: Normal heart sounds. No murmur heard.    No friction rub. No gallop.  Pulmonary:     Effort: Pulmonary effort is normal.     Breath sounds: Normal breath sounds. No wheezing, rhonchi or rales.  Musculoskeletal:     Cervical back: Neck supple.  Skin:    General: Skin is warm.     Findings: No rash.  Neurological:     Mental Status: She is alert and oriented to person, place, and time.  Psychiatric:        Behavior: Behavior normal.  The plan was reviewed with the patient/family, and all questions/concerned were addressed.  It was my pleasure to see Kathy Chaney today and participate in her care. Please feel free to contact me with any questions or concerns.  Sincerely,  Wyline Mood, DO Allergy & Immunology  [facility] office: [phone] [facility] office: [phone]

## 2022-12-20 ENCOUNTER — Encounter: Payer: Self-pay | Admitting: Allergy

## 2022-12-20 ENCOUNTER — Ambulatory Visit (INDEPENDENT_AMBULATORY_CARE_PROVIDER_SITE_OTHER): Payer: BLUE CROSS/BLUE SHIELD | Admitting: Allergy

## 2022-12-20 ENCOUNTER — Other Ambulatory Visit: Payer: Self-pay

## 2022-12-20 VITALS — BP 138/82 | HR 89 | Temp 98.2°F | Resp 16 | Ht 63.0 in | Wt 161.2 lb

## 2022-12-20 DIAGNOSIS — J3089 Other allergic rhinitis: Secondary | ICD-10-CM | POA: Diagnosis not present

## 2022-12-20 DIAGNOSIS — R030 Elevated blood-pressure reading, without diagnosis of hypertension: Secondary | ICD-10-CM | POA: Diagnosis not present

## 2022-12-20 MED ORDER — FLUTICASONE PROPIONATE 50 MCG/ACT NA SUSP: 1.0000 | Freq: Every day | NASAL | 5 refills | Status: AC | PRN

## 2022-12-20 NOTE — Patient Instructions (Addendum)
Rhinitis  Return for allergy skin testing. Will make additional recommendations based on results. If significant positives will recommend allergy injections - handout given. Make sure you don't take any antihistamines for 3 days before the skin testing appointment. Don't put any lotion on the back and arms on the day of testing.  Plan on being here for 30-60 minutes.   Use Flonase (fluticasone) nasal spray 2 sprays per nostril once a day as needed for nasal congestion.  Nasal saline spray (i.e., Simply Saline) or nasal saline lavage (i.e., NeilMed) is recommended as needed and prior to medicated nasal sprays. Continue Singulair (montelukast) 10mg  daily at night.  Follow up for skin testing.   Elevated blood pressure  Blood pressure reading was high in our office today. Vitals:   12/20/22 0907 12/20/22 0944  BP: (!) 148/108 138/82   Please follow up with PCP regarding this.     Pet Allergen Avoidance: Contrary to popular opinion, there are no "hypoallergenic" breeds of dogs or cats. That is because people are not allergic to an animal's hair, but to an allergen found in the animal's saliva, dander (dead skin flakes) or urine. Pet allergy symptoms typically occur within minutes. For some people, symptoms can build up and become most severe 8 to 12 hours after contact with the animal. People with severe allergies can experience reactions in public places if dander has been transported on the pet owners' clothing. Keeping an animal outdoors is only a partial solution, since homes with pets in the yard still have higher concentrations of animal allergens. Before getting a pet, ask your allergist to determine if you are allergic to animals. If your pet is already considered part of your family, try to minimize contact and keep the pet out of the bedroom and other rooms where you spend a great deal of time. As with dust mites, vacuum carpets often or replace carpet with a hardwood floor, tile or  linoleum. High-efficiency particulate air (HEPA) cleaners can reduce allergen levels over time. While dander and saliva are the source of cat and dog allergens, urine is the source of allergens from rabbits, hamsters, mice and [place] pigs; so ask a non-allergic family member to clean the animal's cage. If you have a pet allergy, talk to your allergist about the potential for allergy immunotherapy (allergy shots). This strategy can often provide long-term relief.

## 2022-12-28 NOTE — Progress Notes (Unsigned)
   Skin testing note  RE: Kathy Chaney MRN: [id number] DOB: [birth year] Date of Office Visit: 12/29/2022  Referring provider: Ollen Bowl, MD Primary care provider: Ollen Bowl, MD  Chief Complaint: No chief complaint on file.  History of Present Illness: I had the pleasure of seeing Kathy Chaney for a skin testing visit at the [facility] on 12/28/2022. She is a 47 y.o. female, who is being followed for allergic rhinitis. Her previous allergy office visit was on 12/20/2022 with Dr. Selena Batten. Today is a skin testing visit.   Discussed the use of AI scribe software for clinical note transcription with the patient, who gave verbal consent to proceed.  History of Present Illness             Other allergic rhinitis Persistent sneezing, congestion, and itchy nose for the past year and a half. Symptoms are not seasonal and persist regardless of weather. She has two cats at home and experiences itchiness when in contact with them. She has tried various over-the-counter medications with limited relief.  No prior ENT evaluation. Return for allergy skin testing. Will make additional recommendations based on results. If significant positives will recommend allergy injections - handout given. Use Flonase (fluticasone) nasal spray 2 sprays per nostril once a day as needed for nasal congestion.  Nasal saline spray (i.e., Simply Saline) or nasal saline lavage (i.e., NeilMed) is recommended as needed and prior to medicated nasal sprays. Continue Singulair (montelukast) 10mg  daily at night.    Assessment and Plan: Kathy Chaney is a 47 y.o. female with: ***  Assessment and Plan              No follow-ups on file.  No orders of the defined types were placed in this encounter.  Lab Orders  No laboratory test(s) ordered today    Diagnostics: Skin Testing: Environmental allergy panel. *** Results discussed with patient/family.   Previous notes and tests  were reviewed. The plan was reviewed with the patient/family, and all questions/concerned were addressed.  It was my pleasure to see Kathy Chaney today and participate in her care. Please feel free to contact me with any questions or concerns.  Sincerely,  Wyline Mood, DO Allergy & Immunology  [facility] office: [phone] [facility] office: [phone]

## 2022-12-29 ENCOUNTER — Encounter: Payer: Self-pay | Admitting: Allergy

## 2022-12-29 ENCOUNTER — Ambulatory Visit (INDEPENDENT_AMBULATORY_CARE_PROVIDER_SITE_OTHER): Payer: BLUE CROSS/BLUE SHIELD | Admitting: Allergy

## 2022-12-29 DIAGNOSIS — J3089 Other allergic rhinitis: Secondary | ICD-10-CM

## 2022-12-29 DIAGNOSIS — J3081 Allergic rhinitis due to animal (cat) (dog) hair and dander: Secondary | ICD-10-CM

## 2022-12-29 NOTE — Patient Instructions (Addendum)
Today's skin testing positive to cats only.  Results given.  Environmental allergies Start environmental control measures as below. Will get bloodwork to check for additional allergens as you are having some lip tingling on skin prick testing today. Use Flonase (fluticasone) nasal spray 1-2 sprays per nostril once a day as needed for nasal congestion.  Nasal saline spray (i.e., Simply Saline) or nasal saline lavage (i.e., NeilMed) is recommended as needed and prior to medicated nasal sprays. Continue Singulair (montelukast) 10mg  daily at night. Use over the counter antihistamines such as Zyrtec (cetirizine), Claritin (loratadine), Allegra (fexofenadine), or Xyzal (levocetirizine) daily as needed. May take twice a day during allergy flares. May switch antihistamines every few months. Start allergy injections. It will be at least 1 injection. If the bloodwork shows a lot more allergies it may be 2 injections.  Had a detailed discussion with patient/family that clinical history is suggestive of allergic rhinitis, and may benefit from allergy immunotherapy (AIT). Discussed in detail regarding the dosing, schedule, side effects (mild to moderate local allergic reaction and rarely systemic allergic reactions including anaphylaxis), and benefits (significant improvement in nasal symptoms, seasonal flares of asthma) of immunotherapy with the patient. There is significant time commitment involved with allergy shots, which includes weekly immunotherapy injections for first 9-12 months and then biweekly to monthly injections for 3-5 years. Consent was signed.  Return in about 6 months (around 06/29/2023). Or sooner if needed.   Pet Allergen Avoidance: Contrary to popular opinion, there are no "hypoallergenic" breeds of dogs or cats. That is because people are not allergic to an animal's hair, but to an allergen found in the animal's saliva, dander (dead skin flakes) or urine. Pet allergy symptoms typically  occur within minutes. For some people, symptoms can build up and become most severe 8 to 12 hours after contact with the animal. People with severe allergies can experience reactions in public places if dander has been transported on the pet owners' clothing. Keeping an animal outdoors is only a partial solution, since homes with pets in the yard still have higher concentrations of animal allergens. Before getting a pet, ask your allergist to determine if you are allergic to animals. If your pet is already considered part of your family, try to minimize contact and keep the pet out of the bedroom and other rooms where you spend a great deal of time. As with dust mites, vacuum carpets often or replace carpet with a hardwood floor, tile or linoleum. High-efficiency particulate air (HEPA) cleaners can reduce allergen levels over time. While dander and saliva are the source of cat and dog allergens, urine is the source of allergens from rabbits, hamsters, mice and [place] pigs; so ask a non-allergic family member to clean the animal's cage. If you have a pet allergy, talk to your allergist about the potential for allergy immunotherapy (allergy shots). This strategy can often provide long-term relief.

## 2022-12-31 LAB — ALLERGENS W/TOTAL IGE AREA 2
Alternaria Alternata IgE: <0.1 kU/L
Aspergillus Fumigatus IgE: <0.1 kU/L
Bermuda Grass IgE: <0.1 kU/L
Cat Dander IgE: 5.17 kU/L — AB
Cedar, Mountain IgE: <0.1 kU/L
Cladosporium Herbarum IgE: <0.1 kU/L
Cockroach, German IgE: <0.1 kU/L
Common Silver Birch IgE: <0.1 kU/L
Cottonwood IgE: <0.1 kU/L
D Farinae IgE: <0.1 kU/L
D Pteronyssinus IgE: <0.1 kU/L
Dog Dander IgE: 0.6 kU/L — AB
Elm, American IgE: <0.1 kU/L
IgE (Immunoglobulin E), Serum: 68 [IU]/mL (ref 6–495)
Johnson Grass IgE: <0.1 kU/L
Maple/Box Elder IgE: <0.1 kU/L
Mouse Urine IgE: <0.1 kU/L
Oak, White IgE: <0.1 kU/L
Pecan, Hickory IgE: <0.1 kU/L
Penicillium Chrysogen IgE: <0.1 kU/L
Pigweed, Rough IgE: <0.1 kU/L
Ragweed, Short IgE: <0.1 kU/L
Sheep Sorrel IgE Qn: <0.1 kU/L
Timothy Grass IgE: <0.1 kU/L
White Mulberry IgE: <0.1 kU/L

## 2023-04-28 ENCOUNTER — Other Ambulatory Visit (HOSPITAL_COMMUNITY): Payer: Self-pay | Admitting: Interventional Radiology

## 2023-04-28 DIAGNOSIS — I671 Cerebral aneurysm, nonruptured: Secondary | ICD-10-CM

## 2023-05-10 ENCOUNTER — Ambulatory Visit: Payer: BLUE CROSS/BLUE SHIELD | Admitting: Dermatology

## 2023-05-18 ENCOUNTER — Ambulatory Visit (HOSPITAL_COMMUNITY)
Admission: RE | Admit: 2023-05-18 | Discharge: 2023-05-18 | Disposition: A | Discharge status: Home / Self Care | Source: Ambulatory Visit | Attending: Interventional Radiology | Admitting: Interventional Radiology

## 2023-05-18 DIAGNOSIS — I671 Cerebral aneurysm, nonruptured: Secondary | ICD-10-CM | POA: Insufficient documentation

## 2023-05-19 ENCOUNTER — Telehealth (HOSPITAL_COMMUNITY): Payer: Self-pay

## 2023-05-19 NOTE — Telephone Encounter (Signed)
 Pt agreed to f/u in 2 years with an mra. AB

## 2023-06-22 ENCOUNTER — Ambulatory Visit: Payer: BLUE CROSS/BLUE SHIELD | Admitting: Allergy

## 2023-06-22 DIAGNOSIS — J309 Allergic rhinitis, unspecified: Secondary | ICD-10-CM

## 2023-06-22 NOTE — Progress Notes (Deleted)
 Follow Up Note  RE: Kathy Chaney MRN: [id number] DOB: [birth year] Date of Office Visit: 06/22/2023  Referring provider: Elester Grim, MD Primary care provider: Elester Grim, MD  Chief Complaint: No chief complaint on file.  History of Present Illness: I had the pleasure of seeing Kathy Chaney for a follow up visit at the [facility] on 06/22/2023. She is a 48 y.o. female, who is being followed for allergic rhinitis. Her previous allergy  office visit was on 12/29/2022 with Dr. Burdette Carolin. Today is a regular follow up visit.  Discussed the use of AI scribe software for clinical note transcription with the patient, who gave verbal consent to proceed.  History of Present Illness            2024 labs: "Bloodwork positive to cat and dog. Let me know if you are still interested in starting injections - this would be 1 shot."  Assessment and Plan: [name] is a 48 y.o. 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 Symptoms are not seasonal and persist regardless of weather. She has two cats at home and experiences itchiness when in contact with them. She has tried various over-the-counter medications with limited relief.  No prior ENT evaluation. Today's skin prick testing positive to cats only. Start environmental control measures as below. Will get bloodwork to check for additional allergens as you are having some lip tingling on skin prick testing today. Use Flonase  (fluticasone ) nasal spray 1-2 sprays per nostril once a day as needed for nasal congestion.  Nasal saline spray (i.e., Simply Saline) or nasal saline lavage (i.e., NeilMed) is recommended as needed and prior to medicated nasal sprays. Continue Singulair  (montelukast ) 10mg  daily at night. Use over the counter antihistamines such as Zyrtec (cetirizine), Claritin (loratadine),  Allegra (fexofenadine), or Xyzal (levocetirizine) daily as needed. May take twice a day during allergy  flares. May switch antihistamines every few months. Start allergy  injections. Will confirm with patient 1 or 2 shots depending on bloodwork results.  Had a detailed discussion with patient/family that clinical history is suggestive of allergic rhinitis, and may benefit from allergy  immunotherapy (AIT). Discussed in detail regarding the dosing, schedule, side effects (mild to moderate local allergic reaction and rarely systemic allergic reactions including anaphylaxis), and benefits (significant improvement in nasal symptoms, seasonal flares of asthma) of immunotherapy with the patient. There is significant time commitment involved with allergy  shots, which includes weekly immunotherapy injections for first 9-12 months and then biweekly to monthly injections for 3-5 years. Consent was signed.   Assessment and Plan              No follow-ups on file.  No orders of the defined types were placed in this encounter.  Lab Orders  No laboratory test(s) ordered today    Diagnostics: None.   Medication List:  Current Outpatient Medications  Medication Sig Dispense Refill  . acetaminophen  (TYLENOL ) 500 MG tablet Take 2 tablets (1,000 mg total) by mouth every 8 (eight) hours as needed for mild pain or moderate pain. 30 tablet 0  . diphenhydrAMINE  (BENADRYL ) 25 MG tablet Take 25 mg by mouth every 6 (six) hours as needed.    . fluticasone  (FLONASE ) 50 MCG/ACT nasal spray Place 1-2 sprays into both nostrils daily as needed (nasal congestion). 16 g 5  . hydrochlorothiazide  (MICROZIDE ) 12.5 MG capsule Take 12.5 mg by mouth daily.    Aaron Aas  methocarbamol  (ROBAXIN ) 500 MG tablet Take 1 tablet (500 mg total) by mouth at bedtime as needed for muscle spasms. 10 tablet 0  . montelukast  (SINGULAIR ) 10 MG tablet Take 10 mg by mouth at bedtime as needed.    . ondansetron  (ZOFRAN ) 4 MG tablet Take 1 tablet (4 mg  total) by mouth every 8 (eight) hours as needed for nausea or vomiting. 20 tablet 0  . pantoprazole  (PROTONIX ) 20 MG tablet Take 40 mg by mouth daily.  1   No current facility-administered medications for this visit.   Allergies: Allergies  Allergen Reactions  . Nitrofurantoin     Macrodantin causes heart palpitation  . Other     Walberries feel fuzzy tongue itches   . Amlodipine  Cough   I reviewed her past medical history, social history, family history, and environmental history and no significant changes have been reported from her previous visit.  Review of Systems  Constitutional:  Negative for appetite change, chills, fever and unexpected weight change.  HENT:  Negative for congestion and rhinorrhea.   Eyes:  Negative for itching.  Respiratory:  Negative for cough, chest tightness, shortness of breath and wheezing.   Cardiovascular:  Negative for chest pain.  Gastrointestinal:  Negative for abdominal pain.  Genitourinary:  Negative for difficulty urinating.  Skin:  Negative for rash.  Allergic/Immunologic: Positive for environmental allergies.  Neurological:  Negative for headaches.   Objective: LMP 06/27/2020  There is no height or weight on file to calculate BMI. Physical Exam Vitals and nursing note reviewed.  Constitutional:      Appearance: Normal appearance. She is well-developed.  HENT:     Head: Normocephalic and atraumatic.     Right Ear: Tympanic membrane and external ear normal.     Left Ear: Tympanic membrane and external ear normal.     Nose: Nose normal.     Mouth/Throat:     Mouth: Mucous membranes are moist.     Pharynx: Oropharynx is clear.  Eyes:     Conjunctiva/sclera: Conjunctivae normal.  Cardiovascular:     Rate and Rhythm: Normal rate and regular rhythm.     Heart sounds: Normal heart sounds. No murmur heard.    No friction rub. No gallop.  Pulmonary:     Effort: Pulmonary effort is normal.     Breath sounds: Normal breath sounds. No  wheezing, rhonchi or rales.  Musculoskeletal:     Cervical back: Neck supple.  Skin:    General: Skin is warm.     Findings: No rash.  Neurological:     Mental Status: She is alert and oriented to person, place, and time.  Psychiatric:        Behavior: Behavior normal.  Previous notes and tests were reviewed. The plan was reviewed with the patient/family, and all questions/concerned were addressed.  It was my pleasure to see Vincenta today and participate in her care. Please feel free to contact me with any questions or concerns.  Sincerely,  Eudelia Hero, DO Allergy  & Immunology  [facility]  [place] office: [phone] [facility] office: [phone]

## 2023-07-05 ENCOUNTER — Emergency Department (HOSPITAL_COMMUNITY)

## 2023-07-05 ENCOUNTER — Encounter (HOSPITAL_COMMUNITY): Payer: Self-pay

## 2023-07-05 ENCOUNTER — Other Ambulatory Visit: Payer: Self-pay

## 2023-07-05 ENCOUNTER — Emergency Department (HOSPITAL_COMMUNITY)
Admission: EM | Admit: 2023-07-05 | Discharge: 2023-07-06 | Disposition: A | Discharge status: Home / Self Care | Attending: Emergency Medicine | Admitting: Emergency Medicine

## 2023-07-05 DIAGNOSIS — R110 Nausea: Secondary | ICD-10-CM | POA: Diagnosis not present

## 2023-07-05 DIAGNOSIS — M79602 Pain in left arm: Secondary | ICD-10-CM | POA: Diagnosis not present

## 2023-07-05 DIAGNOSIS — F17200 Nicotine dependence, unspecified, uncomplicated: Secondary | ICD-10-CM | POA: Diagnosis not present

## 2023-07-05 DIAGNOSIS — Z8616 Personal history of COVID-19: Secondary | ICD-10-CM | POA: Insufficient documentation

## 2023-07-05 DIAGNOSIS — I10 Essential (primary) hypertension: Secondary | ICD-10-CM | POA: Insufficient documentation

## 2023-07-05 DIAGNOSIS — M542 Cervicalgia: Secondary | ICD-10-CM | POA: Diagnosis present

## 2023-07-05 LAB — BASIC METABOLIC PANEL WITH GFR
Anion gap: 9 (ref 5–15)
BUN: 8 mg/dL (ref 6–20)
CO2: 25 mmol/L (ref 22–32)
Calcium: 9.2 mg/dL (ref 8.9–10.3)
Chloride: 102 mmol/L (ref 98–111)
Creatinine, Ser: 0.86 mg/dL (ref 0.44–1.00)
GFR, Estimated: >60 mL/min (ref >60)
Glucose, Bld: 109 mg/dL — ABNORMAL HIGH (ref 70–99)
Potassium: 3.5 mmol/L (ref 3.5–5.1)
Sodium: 136 mmol/L (ref 135–145)

## 2023-07-05 LAB — CBC
HCT: 38.8 % (ref 36.0–46.0)
Hemoglobin: 13.3 g/dL (ref 12.0–15.0)
MCH: 30.6 pg (ref 26.0–34.0)
MCHC: 34.3 g/dL (ref 30.0–36.0)
MCV: 89.2 fL (ref 80.0–100.0)
Platelets: 310 10*3/uL (ref 150–400)
RBC: 4.35 MIL/uL (ref 3.87–5.11)
RDW: 12.9 % (ref 11.5–15.5)
WBC: 7.9 10*3/uL (ref 4.0–10.5)
nRBC: 0 % (ref 0.0–0.2)

## 2023-07-05 LAB — TROPONIN I (HIGH SENSITIVITY)
Troponin I (High Sensitivity): 3 ng/L (ref <18)
Troponin I (High Sensitivity): <2 ng/L (ref <18)

## 2023-07-05 NOTE — ED Triage Notes (Signed)
 Pt reports right sided neck pain onset 3 weeks ago but then today she started having left arm pain and nausea. Denies chest pain, no shortness of breath. Denies known injury.

## 2023-07-06 MED ORDER — METHOCARBAMOL 500 MG PO TABS: 500.0000 mg | ORAL_TABLET | Freq: Every evening | ORAL | 0 refills | Status: AC | PRN

## 2023-07-06 NOTE — ED Provider Notes (Signed)
 [facility] Provider Note   CSN: [id number] Arrival date & time: 07/05/23  1601     Patient presents with: Nausea   Kathy Chaney is a 48 y.o. female history of hypertension, GERD, basilar aneurysm status post coiling 2012 presents with complaints of neck pain that radiates down her left arm.  Did have some nausea today as well without vomiting or abdominal pain.  Symptoms have been ongoing for the past 2 weeks.  No injury or trauma.  She denies any numbness or tingling.  No headache or vision changes.  No history of similar symptoms.  Follows regularly with neurosurgery for repeat brain imaging.  Had a follow-up this past March with a stable MRI.   HPI  Past Medical History:  Diagnosis Date   Aneurysm (HCC)    basilar s/p coiling 01/2010 sees dr Mozella Aris yearly   COVID 02/2020   flu like symptoms x 24 hrs all symtpoms resolved   Fibroids 06/30/2020   GERD (gastroesophageal reflux disease)    Headache(784.0)    Hypertension    Peripheral vision loss    left eye   Tobacco abuse    Urticaria    Past Surgical History:  Procedure Laterality Date   ANEURYSM COILING  01/18/2010   basilar artery   HERNIA REPAIR  2011   umbilical   ROBOTIC ASSISTED LAPAROSCOPIC HYSTERECTOMY AND SALPINGECTOMY Bilateral 07/08/2020   Procedure: XI ROBOTIC ASSISTED LAPAROSCOPIC HYSTERECTOMY AND BILATERAL SALPINGECTOMY.;  Surgeon: Arlee Lace, MD;  Location: [facility] ;  Service: Gynecology;  Laterality: Bilateral;   TUBAL LIGATION  2010       Prior to Admission medications   Medication Sig Start Date End Date Taking? Authorizing Provider  acetaminophen  (TYLENOL ) 500 MG tablet Take 2 tablets (1,000 mg total) by mouth every 8 (eight) hours as needed for mild pain or moderate pain. 07/08/20   Arlee Lace, MD  diphenhydrAMINE  (BENADRYL ) 25 MG tablet Take 25 mg by mouth every 6 (six) hours as needed.    [provider]   fluticasone  (FLONASE ) 50 MCG/ACT nasal spray Place 1-2 sprays into both nostrils daily as needed (nasal congestion). 12/20/22   Trudy Fusi, DO  hydrochlorothiazide  (MICROZIDE ) 12.5 MG capsule Take 12.5 mg by mouth daily. 09/09/21   [provider]  methocarbamol  (ROBAXIN ) 500 MG tablet Take 1 tablet (500 mg total) by mouth at bedtime as needed for muscle spasms. 09/10/21   Corine Dice, MD  montelukast  (SINGULAIR ) 10 MG tablet Take 10 mg by mouth at bedtime as needed.    [provider]  ondansetron  (ZOFRAN ) 4 MG tablet Take 1 tablet (4 mg total) by mouth every 8 (eight) hours as needed for nausea or vomiting. 08/17/21   Abelardo Hoehn B, PA-C  pantoprazole  (PROTONIX ) 20 MG tablet Take 40 mg by mouth daily. 10/07/17   [provider]    Allergies: Nitrofurantoin, Other, and Amlodipine     Review of Systems  Musculoskeletal:  Positive for myalgias.    Updated Vital Signs BP (!) 143/89   Pulse 67   Temp 97.7 F (36.5 C) (Oral)   Resp 18   Ht 5' 2 (1.575 m)   Wt 72.6 kg   LMP 06/27/2020   SpO2 100%   BMI 29.26 kg/m   Physical Exam Vitals and nursing note reviewed.  Constitutional:      General: She is not in acute distress.    Appearance: She is well-developed.  HENT:  Head: Normocephalic and atraumatic.   Eyes:     Conjunctiva/sclera: Conjunctivae normal.    Cardiovascular:     Rate and Rhythm: Normal rate and regular rhythm.     Heart sounds: No murmur heard. Pulmonary:     Effort: Pulmonary effort is normal. No respiratory distress.     Breath sounds: Normal breath sounds.  Abdominal:     Palpations: Abdomen is soft.     Tenderness: There is no abdominal tenderness.   Musculoskeletal:        General: No swelling.     Cervical back: Neck supple.     Comments: Positive Spurling, tenderness left trap, tolerates full range of motion of bilateral upper extremities   Skin:    General: Skin is warm and dry.     Capillary Refill:  Capillary refill takes less than 2 seconds.   Neurological:     Mental Status: She is alert.     Comments: Patient is alert and oriented. There is no abnormal phonation. Symmetric smile without facial droop. Moves all extremities spontaneously. 5/5 strength in upper and lower extremities. . No sensation deficit. There is no nystagmus. EOMI, PERRL. Coordination intact with finger to nose and normal ambulation.    Psychiatric:        Mood and Affect: Mood normal.     (all labs ordered are listed, but only abnormal results are displayed) Labs Reviewed  BASIC METABOLIC PANEL WITH GFR - Abnormal; Notable for the following components:      Result Value   Glucose, Bld 109 (*)    All other components within normal limits  CBC  TROPONIN I (HIGH SENSITIVITY)  TROPONIN I (HIGH SENSITIVITY)    EKG: EKG Interpretation Date/Time:  Tuesday July 05 2023 16:19:46 EDT Ventricular Rate:  77 PR Interval:  170 QRS Duration:  72 QT Interval:  376 QTC Calculation: 425 R Axis:   17  Text Interpretation: Normal sinus rhythm Cannot rule out Anterior infarct , age undetermined Abnormal ECG When compared with ECG of 19-Jul-2021 11:45, PREVIOUS ECG IS PRESENT Confirmed by Early Glisson ([id number]) on 07/05/2023 9:07:51 PM  Radiology: [facility] Query Chest 2 View Result Date: 07/05/2023 CLINICAL DATA:  Chest pain. EXAM: CHEST - 2 VIEW COMPARISON:  07/19/2021. FINDINGS: The heart size and mediastinal contours are within normal limits. Both lungs are clear. No pleural effusion or pneumothorax. The visualized skeletal structures are unremarkable. IMPRESSION: No acute cardiopulmonary findings. Electronically Signed   By: Mannie Seek M.D.   On: 07/05/2023 17:03     Procedures   Medications Ordered in the ED - No data to display                                  Medical Decision Making Amount and/or Complexity of Data Reviewed Labs: ordered. Radiology: ordered.   This patient presents to the ED with chief  complaint(s) of arm pain.  The complaint involves an extensive differential diagnosis and also carries with it a high risk of complications and morbidity.   Pertinent past medical history as listed in HPI  The differential diagnosis includes  CVA, TIA, cervical radiculopathy, ACS, muscle strain, meningitis Additional history obtained: Records reviewed Care Everywhere/External Records  Assessment and management:   Hemodynamically stable, nontoxic-appearing patient presenting with complaints of neck pain that radiates down her left arm x 2 weeks.  Did have some nausea today as well without abdominal pain or vomiting.  She  has no cardiac history.  No prior blood clots.  She does have a history of basilar aneurysm status post coiling in 2012.  Has had no complications since.  Had recent follow-up in March.  MRI brain was stable.  Her symptoms are not associated with headache, blurry vision, numbness or tingling dizziness, chest pain or shortness of breath.  She does have tenderness to her left trap.  She has no meningismus.  She has no neurodeficits on exam.  She does have positive Spurling's.  Her workup is overall reassuring..  Suspect musculoskeletal etiology such as cervical radiculopathy.  Will refer to orthopedics.  Patient is interested in muscle relaxers will send in short prescription.  Instructed on safe usage.  Independent ECG interpretation:  Normal sinus rhythm  Independent labs interpretation:  The following labs were independently interpreted:  CBC unremarkable, BMP without significant abnormality, troponin without elevation  Independent visualization and interpretation of imaging: I independently visualized the following imaging with scope of interpretation limited to determining acute life threatening conditions related to emergency care: Chest x-ray without acute cardiopulmonary disease   Consultations obtained:   none  Disposition:   Patient will be discharged home. The  patient has been appropriately medically screened and/or stabilized in the ED. I have low suspicion for any other emergent medical condition which would require further screening, evaluation or treatment in the ED or require inpatient management. At time of discharge the patient is hemodynamically stable and in no acute distress. I have discussed work-up results and diagnosis with patient and answered all questions. Patient is agreeable with discharge plan. We discussed strict return precautions for returning to the emergency department and they verbalized understanding.     Social Determinants of Health:   none  This note was dictated with voice recognition software.  Despite best efforts at proofreading, errors may have occurred which can change the documentation meaning.       Final diagnoses:  Neck pain  Pain of left upper extremity  Nausea    ED Discharge Orders     None          Felicie Horning, PA-C 07/06/23 0251    Albertus Hughs, DO 07/06/23 [phone]

## 2023-07-06 NOTE — Discharge Instructions (Addendum)
 Your lab work and imaging did not not show any significant abnormality.  Please call the number on the sheet to follow-up with orthopedics.  A short supply of muscle relaxants was sent into your pharmacy.  Please avoid driving or operating heavy machinery while using this medication as it may cause drowsiness.

## 2023-07-15 ENCOUNTER — Encounter (HOSPITAL_COMMUNITY): Payer: Self-pay | Admitting: Interventional Radiology

## 2023-09-15 ENCOUNTER — Other Ambulatory Visit: Payer: Self-pay | Admitting: Internal Medicine

## 2023-09-15 DIAGNOSIS — Z1231 Encounter for screening mammogram for malignant neoplasm of breast: Secondary | ICD-10-CM

## 2023-10-27 ENCOUNTER — Other Ambulatory Visit: Payer: Self-pay | Admitting: Internal Medicine

## 2023-10-27 DIAGNOSIS — N6341 Unspecified lump in right breast, subareolar: Secondary | ICD-10-CM

## 2023-11-03 ENCOUNTER — Ambulatory Visit
Admission: RE | Admit: 2023-11-03 | Discharge: 2023-11-03 | Disposition: A | Discharge status: Home / Self Care | Source: Ambulatory Visit | Attending: Internal Medicine | Admitting: Internal Medicine

## 2023-11-03 DIAGNOSIS — N6341 Unspecified lump in right breast, subareolar: Secondary | ICD-10-CM

## 2023-11-10 ENCOUNTER — Encounter: Payer: Self-pay | Admitting: Dermatology

## 2023-11-10 ENCOUNTER — Ambulatory Visit: Admitting: Dermatology

## 2023-11-10 VITALS — BP 162/93

## 2023-11-10 DIAGNOSIS — L649 Androgenic alopecia, unspecified: Secondary | ICD-10-CM | POA: Diagnosis not present

## 2023-11-10 DIAGNOSIS — R519 Headache, unspecified: Secondary | ICD-10-CM

## 2023-11-10 MED ORDER — SAFETY SEAL MISCELLANEOUS MISC: 5 refills | Status: AC

## 2023-11-10 NOTE — Progress Notes (Signed)
   New Patient Visit   Subjective  Kathy Chaney is a 48 y.o. female who presents for a NEW PATIENT appointment to be examined for the concerns as listed below.  Hair Loss/Shedding: Pt has noticed an uptick of shedding with balding patches 2 years ago. She stated ithe hair shedding is most noticeable on hair wash days. Pt has not had any prescriptions or tried TC products for this concern. She did mention she previously relaxed her hair at the age of 33 for about 10 years but she has been natural for the last 20 years. No Hx of wearing tight hairstyles. Pt does have tenderness that comes and goes at the area that she rates 8 out of 10. She has no itching.    Are you nursing, pregnant or trying to conceive? No    No Hx of Bx. No family Hx of skin cancer.   The following portions of the chart were reviewed this encounter and updated as appropriate: medications, allergies, medical history  Review of Systems:  No other skin or systemic complaints except as noted in HPI or Assessment and Plan.  Objective  Well appearing patient in no apparent distress; mood and affect are within normal limits.   A focused examination was performed of the following areas: scalp   Relevant exam findings are noted in the Assessment and Plan.             Assessment & Plan   ANDROGENETIC ALOPECIA (FEMALE PATTERN HAIR LOSS) Exam: Diffuse thinning of the crown and widening of the midline part with retention of the frontal hairline  Chronic hair thinning over several years with classic bitemporal thinning and widening of the midparts, likely due to hormonal changes and genetic sensitivity of hair follicles to androgens. No scarring observed.  - Prescribe 8% minoxidil compounded with finasteride for topical application once daily in the morning to promote hair regrowth. - Recommend V[employer]'s collagen supplement, preferably in powder form to be mixed with coffee, to support hair strength and  growth. - Advise against chemical relaxers and tight hairstyles. - Instruct to switch the pattern of braids every eight weeks if using a U-part wig. - Discuss potential for hair regrowth with treatment, with noticeable changes expected in six months and significant improvement in a year to a year and a half. - Schedule follow-up in six months to assess early signs of improvement.  Scalp tenderness Persistent scalp tenderness, particularly after hot showers, likely related to inflammation. - Add clobetasol to the treatment regimen for anti-inflammatory effects.  ANDROGENETIC ALOPECIA   Related Medications Safety Seal Miscellaneous MISC Minoxidil 8% Clobetasol 0.05% Finasteride 1% solution - apply to the affected areas of the scalp QAM.  Return in about 6 months (around 05/10/2024) for ANDROGENETIC ALOPECIA.   Documentation: I have reviewed the above documentation for accuracy and completeness, and I agree with the above.  I, Shirron Maranda, CMA, am acting as scribe for [employer], DO.   Delon Lenis, DO

## 2023-11-10 NOTE — Patient Instructions (Addendum)
 VISIT SUMMARY:  Today, we discussed your concerns about hair thinning and scalp tenderness. You have been experiencing hair thinning for a couple of years, and you also have tenderness in your scalp, especially after hot showers. We have developed a treatment plan to address these issues.  YOUR PLAN:  -ANDROGENETIC ALOPECIA:  Androgenetic alopecia is a common form of hair loss due to hormonal changes and genetic sensitivity of hair follicles to androgens.  We will start you on an 8% minoxidil compounded with finasteride 0.05% for topical application once daily in the morning to promote hair regrowth.  Additionally, I recommend taking [employer]'s collagen supplement, preferably in powder form mixed with coffee, to support hair strength and growth. Avoid using chemical relaxers and tight hairstyles, and switch the pattern of your braids every eight weeks if you continue using a U-part wig. You can expect to see noticeable changes in six months, with significant improvement in a year to a year and a half.  -SCALP TENDERNESS:  Scalp tenderness, especially after hot showers, is likely due to inflammation. To help with this, I am adding clobetasol to your treatment regimen for its anti-inflammatory effects.  INSTRUCTIONS:  Please schedule a follow-up appointment in six months to assess early signs of improvement.        Important Information  Due to recent changes in healthcare laws, you may see results of your pathology and/or laboratory studies on MyChart before the doctors have had a chance to review them. We understand that in some cases there may be results that are confusing or concerning to you. Please understand that not all results are received at the same time and often the doctors may need to interpret multiple results in order to provide you with the best plan of care or course of treatment. Therefore, we ask that you please give us  2 business days to thoroughly review all your  results before contacting the office for clarification. Should we see a critical lab result, you will be contacted sooner.   If You Need Anything After Your Visit  If you have any questions or concerns for your doctor, please call our main line at [phone] If no one answers, please leave a voicemail as directed and we will return your call as soon as possible. Messages left after 4 pm will be answered the following business day.   You may also send us  a message via MyChart. We typically respond to MyChart messages within 1-2 business days.  For prescription refills, please ask your pharmacy to contact our office. Our fax number is [phone].  If you have an urgent issue when the clinic is closed that cannot wait until the next business day, you can page your doctor at the number below.    Please note that while we do our best to be available for urgent issues outside of office hours, we are not available 24/7.   If you have an urgent issue and are unable to reach us , you may choose to seek medical care at your doctor's office, retail clinic, urgent care center, or emergency room.  If you have a medical emergency, please immediately call 911 or go to the emergency department. In the event of inclement weather, please call our main line at [phone] for an update on the status of any delays or closures.  Dermatology Medication Tips: Please keep the boxes that topical medications come in in order to help keep track of the instructions about where and how to use these.  Pharmacies typically print the medication instructions only on the boxes and not directly on the medication tubes.   If your medication is too expensive, please contact our office at [phone] or send us  a message through MyChart.   We are unable to tell what your co-pay for medications will be in advance as this is different depending on your insurance coverage. However, we may be able to find a substitute medication  at lower cost or fill out paperwork to get insurance to cover a needed medication.   If a prior authorization is required to get your medication covered by your insurance company, please allow us  1-2 business days to complete this process.  Drug prices often vary depending on where the prescription is filled and some pharmacies may offer cheaper prices.  The website www.goodrx.com contains coupons for medications through different pharmacies. The prices here do not account for what the cost may be with help from insurance (it may be cheaper with your insurance), but the website can give you the price if you did not use any insurance.  - You can print the associated coupon and take it with your prescription to the pharmacy.  - You may also stop by our office during regular business hours and pick up a GoodRx coupon card.  - If you need your prescription sent electronically to a different pharmacy, notify our office through [facility] or by phone at [phone]

## 2023-11-28 ENCOUNTER — Ambulatory Visit

## 2023-11-29 ENCOUNTER — Other Ambulatory Visit: Payer: Self-pay

## 2023-11-29 ENCOUNTER — Emergency Department (HOSPITAL_COMMUNITY): Admission: EM | Admit: 2023-11-29 | Discharge: 2023-11-30 | Discharge status: Against Medical Advice

## 2023-11-29 ENCOUNTER — Encounter (HOSPITAL_COMMUNITY): Payer: Self-pay

## 2023-11-29 ENCOUNTER — Emergency Department (HOSPITAL_COMMUNITY)

## 2023-11-29 DIAGNOSIS — Z5321 Procedure and treatment not carried out due to patient leaving prior to being seen by health care provider: Secondary | ICD-10-CM | POA: Insufficient documentation

## 2023-11-29 DIAGNOSIS — R002 Palpitations: Secondary | ICD-10-CM | POA: Diagnosis present

## 2023-11-29 DIAGNOSIS — R079 Chest pain, unspecified: Secondary | ICD-10-CM | POA: Insufficient documentation

## 2023-11-29 DIAGNOSIS — M79602 Pain in left arm: Secondary | ICD-10-CM | POA: Diagnosis not present

## 2023-11-29 DIAGNOSIS — R110 Nausea: Secondary | ICD-10-CM | POA: Insufficient documentation

## 2023-11-29 LAB — BASIC METABOLIC PANEL WITH GFR
Anion gap: 12 (ref 5–15)
BUN: 11 mg/dL (ref 6–20)
CO2: 26 mmol/L (ref 22–32)
Calcium: 9.3 mg/dL (ref 8.9–10.3)
Chloride: 102 mmol/L (ref 98–111)
Creatinine, Ser: 1 mg/dL (ref 0.44–1.00)
GFR, Estimated: >60 mL/min (ref >60)
Glucose, Bld: 111 mg/dL — ABNORMAL HIGH (ref 70–99)
Potassium: 3.6 mmol/L (ref 3.5–5.1)
Sodium: 140 mmol/L (ref 135–145)

## 2023-11-29 LAB — CBC
HCT: 38.2 % (ref 36.0–46.0)
Hemoglobin: 13.1 g/dL (ref 12.0–15.0)
MCH: 30.5 pg (ref 26.0–34.0)
MCHC: 34.3 g/dL (ref 30.0–36.0)
MCV: 88.8 fL (ref 80.0–100.0)
Platelets: 293 K/uL (ref 150–400)
RBC: 4.3 MIL/uL (ref 3.87–5.11)
RDW: 13.1 % (ref 11.5–15.5)
WBC: 7.6 K/uL (ref 4.0–10.5)
nRBC: 0 % (ref 0.0–0.2)

## 2023-11-29 LAB — TROPONIN I (HIGH SENSITIVITY)
Troponin I (High Sensitivity): 3 ng/L (ref <18)
Troponin I (High Sensitivity): 3 ng/L (ref <18)

## 2023-11-29 NOTE — ED Provider Triage Note (Signed)
 Emergency Medicine Provider Triage Evaluation Note  Kathy Chaney , a 48 y.o. female  was evaluated in triage.  Pt complains of chest pain that goes down L arm. Denies shortness of breath. Pain has been episodic for 1 week. Denies vomiting but does appreciate vomiting. Former smoker.   Review of Systems  Positive: Chest pain, L arm pain Negative: Fever, chills, chest pain, shortness of breath  Physical Exam  BP (!) 151/97 (BP Location: Right Arm)   Pulse 69   Temp 98.1 F (36.7 C)   Resp 18   Ht 5' 2 (1.575 m)   Wt 73 kg   LMP 06/27/2020   SpO2 97%   BMI 29.45 kg/m  Gen:   Awake, no distress   Resp:  Normal effort, talking in full sentences on room air MSK:   Moves extremities without difficulty  Other:    Medical Decision Making  Medically screening exam initiated at 7:02 PM.  Appropriate orders placed.  Kathy Chaney was informed that the remainder of the evaluation will be completed by another provider, this initial triage assessment does not replace that evaluation, and the importance of remaining in the ED until their evaluation is complete.  Orders: CBC, BMP, Troponin, EKG, CXR   Kathy Terrall FALCON, PA-C 11/29/23 1904

## 2023-11-29 NOTE — ED Triage Notes (Signed)
 PResents for L arm aching, nausea and palpitations. Onset ~1 week ago. Worse today. Just want to get checked out. Rates 4/10. Alert, NAD, calm, interactive, resps e/u, speaking in clear complete sentences. Skin W&D. Steady gait.

## 2023-11-30 NOTE — ED Notes (Signed)
 Pt left the lobby. Pt states that she wanted to make sure she wasn't having a heart attack. Pt states that she obviously wasn't.

## 2024-02-20 ENCOUNTER — Other Ambulatory Visit: Payer: Self-pay

## 2024-02-20 ENCOUNTER — Emergency Department (HOSPITAL_BASED_OUTPATIENT_CLINIC_OR_DEPARTMENT_OTHER): Admitting: Radiology

## 2024-02-20 ENCOUNTER — Emergency Department (HOSPITAL_BASED_OUTPATIENT_CLINIC_OR_DEPARTMENT_OTHER)

## 2024-02-20 ENCOUNTER — Emergency Department (HOSPITAL_BASED_OUTPATIENT_CLINIC_OR_DEPARTMENT_OTHER): Admission: EM | Admit: 2024-02-20 | Discharge: 2024-02-20 | Disposition: A | Discharge status: Home / Self Care

## 2024-02-20 ENCOUNTER — Encounter (HOSPITAL_BASED_OUTPATIENT_CLINIC_OR_DEPARTMENT_OTHER): Payer: Self-pay

## 2024-02-20 DIAGNOSIS — M79661 Pain in right lower leg: Secondary | ICD-10-CM | POA: Insufficient documentation

## 2024-02-20 DIAGNOSIS — M545 Low back pain, unspecified: Secondary | ICD-10-CM | POA: Insufficient documentation

## 2024-02-20 DIAGNOSIS — M79662 Pain in left lower leg: Secondary | ICD-10-CM | POA: Insufficient documentation

## 2024-02-20 DIAGNOSIS — R0781 Pleurodynia: Secondary | ICD-10-CM

## 2024-02-20 DIAGNOSIS — R072 Precordial pain: Secondary | ICD-10-CM | POA: Insufficient documentation

## 2024-02-20 LAB — CBC
HCT: 36.9 % (ref 36.0–46.0)
Hemoglobin: 12.9 g/dL (ref 12.0–15.0)
MCH: 31.1 pg (ref 26.0–34.0)
MCHC: 35 g/dL (ref 30.0–36.0)
MCV: 88.9 fL (ref 80.0–100.0)
Platelets: 268 10*3/uL (ref 150–400)
RBC: 4.15 MIL/uL (ref 3.87–5.11)
RDW: 13.1 % (ref 11.5–15.5)
WBC: 6.7 10*3/uL (ref 4.0–10.5)
nRBC: 0 % (ref 0.0–0.2)

## 2024-02-20 LAB — COMPREHENSIVE METABOLIC PANEL WITH GFR
ALT: 17 U/L (ref 0–44)
AST: 20 U/L (ref 15–41)
Albumin: 4.4 g/dL (ref 3.5–5.0)
Alkaline Phosphatase: 54 U/L (ref 38–126)
Anion gap: 8 (ref 5–15)
BUN: 10 mg/dL (ref 6–20)
CO2: 29 mmol/L (ref 22–32)
Calcium: 10 mg/dL (ref 8.9–10.3)
Chloride: 101 mmol/L (ref 98–111)
Creatinine, Ser: 0.65 mg/dL (ref 0.44–1.00)
GFR, Estimated: >60 mL/min
Glucose, Bld: 98 mg/dL (ref 70–99)
Potassium: 3.6 mmol/L (ref 3.5–5.1)
Sodium: 139 mmol/L (ref 135–145)
Total Bilirubin: 0.2 mg/dL (ref 0.0–1.2)
Total Protein: 7.4 g/dL (ref 6.5–8.1)

## 2024-02-20 LAB — PRO BRAIN NATRIURETIC PEPTIDE: Pro Brain Natriuretic Peptide: <50 pg/mL

## 2024-02-20 LAB — TROPONIN T, HIGH SENSITIVITY
Troponin T High Sensitivity: <6 ng/L (ref 0–19)
Troponin T High Sensitivity: <6 ng/L (ref 0–19)

## 2024-02-20 LAB — SARS CORONAVIRUS 2 BY RT PCR: SARS Coronavirus 2 by RT PCR: NEGATIVE

## 2024-02-20 LAB — LIPASE, BLOOD: Lipase: 48 U/L (ref 11–51)

## 2024-02-20 MED ORDER — IOHEXOL 350 MG/ML SOLN
75.0000 mL | Freq: Once | INTRAVENOUS | Status: AC | PRN
  Administered 2024-02-20: 100 mL via INTRAVENOUS

## 2024-02-20 NOTE — Discharge Instructions (Addendum)
 2. 5 mm right upper lobe pulmonary nodule; as per Fleischner Society  Guidelines, no routine follow-up is recommended if low risk, and optional  noncontrast chest CT at 12 months if high risk.   This 1 finding on the CT scan you can follow-up with your PCP.  They may obtain repeat CT imaging in 12 months.  If you have any kind of fever or chills please come back to the ED.  I think this is likely more musculoskeletal in nature.  Your cardiac workup today was unremarkable.  Your blood clot workup was unremarkable.  No evidence of pneumonia in your lungs.

## 2024-02-20 NOTE — ED Triage Notes (Signed)
 Pt POV d/t having back lung pain when she takes a deep.  Pt has had nasal congestion for 2 days.  Pt also says right leg feels swollen.

## 2024-05-17 ENCOUNTER — Ambulatory Visit: Admitting: Dermatology
